# Patient Record
Sex: Female | Born: 1980 | Race: Black or African American | Hispanic: No | Marital: Single | Smoking: Never smoker
Health system: Southern US, Community
[De-identification: ages and names within clinical notes are randomized; demographics above are authoritative.]

## PROBLEM LIST (undated history)

## (undated) ENCOUNTER — Inpatient Hospital Stay (HOSPITAL_COMMUNITY): Payer: Self-pay

## (undated) DIAGNOSIS — A749 Chlamydial infection, unspecified: Secondary | ICD-10-CM

## (undated) DIAGNOSIS — I10 Essential (primary) hypertension: Secondary | ICD-10-CM

## (undated) DIAGNOSIS — J45909 Unspecified asthma, uncomplicated: Secondary | ICD-10-CM

## (undated) DIAGNOSIS — B9689 Other specified bacterial agents as the cause of diseases classified elsewhere: Secondary | ICD-10-CM

## (undated) DIAGNOSIS — N760 Acute vaginitis: Secondary | ICD-10-CM

## (undated) HISTORY — PX: WISDOM TOOTH EXTRACTION: SHX21

---
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
## 2002-03-20 ENCOUNTER — Emergency Department (HOSPITAL_COMMUNITY): Admission: EM | Admit: 2002-03-20 | Discharge: 2002-03-20 | Payer: Self-pay | Admitting: Emergency Medicine

## 2002-06-08 ENCOUNTER — Other Ambulatory Visit: Admission: RE | Admit: 2002-06-08 | Discharge: 2002-06-08 | Payer: Self-pay | Admitting: Family Medicine

## 2002-07-07 ENCOUNTER — Encounter: Payer: Self-pay | Admitting: Emergency Medicine

## 2002-07-07 ENCOUNTER — Emergency Department (HOSPITAL_COMMUNITY): Admission: EM | Admit: 2002-07-07 | Discharge: 2002-07-07 | Payer: Self-pay | Admitting: Emergency Medicine

## 2002-07-26 ENCOUNTER — Emergency Department (HOSPITAL_COMMUNITY): Admission: EM | Admit: 2002-07-26 | Discharge: 2002-07-26 | Payer: Self-pay | Admitting: Emergency Medicine

## 2002-11-19 ENCOUNTER — Encounter: Payer: Self-pay | Admitting: Obstetrics & Gynecology

## 2002-11-19 ENCOUNTER — Ambulatory Visit (HOSPITAL_COMMUNITY): Admission: RE | Admit: 2002-11-19 | Discharge: 2002-11-19 | Payer: Self-pay | Admitting: Obstetrics & Gynecology

## 2003-02-03 ENCOUNTER — Encounter: Payer: Self-pay | Admitting: Obstetrics & Gynecology

## 2003-02-03 ENCOUNTER — Ambulatory Visit (HOSPITAL_COMMUNITY): Admission: RE | Admit: 2003-02-03 | Discharge: 2003-02-03 | Payer: Self-pay | Admitting: Obstetrics & Gynecology

## 2003-05-13 ENCOUNTER — Ambulatory Visit (HOSPITAL_COMMUNITY): Admission: RE | Admit: 2003-05-13 | Discharge: 2003-05-13 | Payer: Self-pay | Admitting: Obstetrics & Gynecology

## 2003-06-10 ENCOUNTER — Ambulatory Visit (HOSPITAL_COMMUNITY): Admission: RE | Admit: 2003-06-10 | Discharge: 2003-06-10 | Payer: Self-pay | Admitting: Obstetrics & Gynecology

## 2003-06-18 ENCOUNTER — Inpatient Hospital Stay (HOSPITAL_COMMUNITY): Admission: AD | Admit: 2003-06-18 | Discharge: 2003-06-20 | Payer: Self-pay | Admitting: Obstetrics & Gynecology

## 2005-03-29 ENCOUNTER — Other Ambulatory Visit: Admission: RE | Admit: 2005-03-29 | Discharge: 2005-03-29 | Payer: Self-pay | Admitting: Family Medicine

## 2005-12-27 ENCOUNTER — Emergency Department (HOSPITAL_COMMUNITY): Admission: EM | Admit: 2005-12-27 | Discharge: 2005-12-27 | Payer: Self-pay | Admitting: Emergency Medicine

## 2006-12-04 ENCOUNTER — Emergency Department (HOSPITAL_COMMUNITY): Admission: EM | Admit: 2006-12-04 | Discharge: 2006-12-05 | Payer: Self-pay | Admitting: Emergency Medicine

## 2007-09-22 ENCOUNTER — Emergency Department (HOSPITAL_COMMUNITY): Admission: EM | Admit: 2007-09-22 | Discharge: 2007-09-22 | Payer: Self-pay | Admitting: Emergency Medicine

## 2007-12-05 ENCOUNTER — Inpatient Hospital Stay (HOSPITAL_COMMUNITY): Admission: AD | Admit: 2007-12-05 | Discharge: 2007-12-06 | Payer: Self-pay | Admitting: Family Medicine

## 2007-12-10 ENCOUNTER — Encounter: Payer: Self-pay | Admitting: Family

## 2007-12-10 ENCOUNTER — Ambulatory Visit: Payer: Self-pay | Admitting: Obstetrics & Gynecology

## 2007-12-11 ENCOUNTER — Ambulatory Visit: Payer: Self-pay | Admitting: Family Medicine

## 2007-12-23 ENCOUNTER — Ambulatory Visit (HOSPITAL_COMMUNITY): Admission: RE | Admit: 2007-12-23 | Discharge: 2007-12-23 | Payer: Self-pay | Admitting: Obstetrics & Gynecology

## 2008-01-08 ENCOUNTER — Ambulatory Visit: Payer: Self-pay | Admitting: *Deleted

## 2008-01-20 ENCOUNTER — Ambulatory Visit (HOSPITAL_COMMUNITY): Admission: RE | Admit: 2008-01-20 | Discharge: 2008-01-20 | Payer: Self-pay | Admitting: Obstetrics & Gynecology

## 2008-01-22 ENCOUNTER — Ambulatory Visit: Payer: Self-pay | Admitting: Obstetrics & Gynecology

## 2008-02-10 ENCOUNTER — Ambulatory Visit (HOSPITAL_COMMUNITY): Admission: RE | Admit: 2008-02-10 | Discharge: 2008-02-10 | Payer: Self-pay | Admitting: Obstetrics & Gynecology

## 2008-02-19 ENCOUNTER — Ambulatory Visit: Payer: Self-pay | Admitting: Obstetrics & Gynecology

## 2008-03-04 ENCOUNTER — Ambulatory Visit: Payer: Self-pay | Admitting: Obstetrics & Gynecology

## 2008-03-09 ENCOUNTER — Ambulatory Visit (HOSPITAL_COMMUNITY): Admission: RE | Admit: 2008-03-09 | Discharge: 2008-03-09 | Payer: Self-pay | Admitting: Obstetrics & Gynecology

## 2008-03-22 ENCOUNTER — Ambulatory Visit: Payer: Self-pay | Admitting: Family Medicine

## 2008-03-24 ENCOUNTER — Ambulatory Visit: Payer: Self-pay | Admitting: Obstetrics & Gynecology

## 2008-03-31 ENCOUNTER — Ambulatory Visit (HOSPITAL_COMMUNITY): Admission: RE | Admit: 2008-03-31 | Discharge: 2008-03-31 | Payer: Self-pay | Admitting: Obstetrics & Gynecology

## 2008-04-05 ENCOUNTER — Ambulatory Visit: Payer: Self-pay | Admitting: Obstetrics & Gynecology

## 2008-04-05 LAB — CONVERTED CEMR LAB
HCT: 34.5 % — ABNORMAL LOW (ref 36.0–46.0)
Hemoglobin: 11.1 g/dL — ABNORMAL LOW (ref 12.0–15.0)
MCHC: 32.2 g/dL (ref 30.0–36.0)
MCV: 79.3 fL (ref 78.0–100.0)
Platelets: 297 10*3/uL (ref 150–400)
RBC: 4.35 M/uL (ref 3.87–5.11)
RDW: 15.3 % (ref 11.5–15.5)
WBC: 9.7 10*3/uL (ref 4.0–10.5)

## 2008-04-21 ENCOUNTER — Ambulatory Visit (HOSPITAL_COMMUNITY): Admission: RE | Admit: 2008-04-21 | Discharge: 2008-04-21 | Payer: Self-pay | Admitting: Obstetrics & Gynecology

## 2008-04-26 ENCOUNTER — Ambulatory Visit: Payer: Self-pay | Admitting: Obstetrics & Gynecology

## 2008-04-26 ENCOUNTER — Encounter: Payer: Self-pay | Admitting: Obstetrics & Gynecology

## 2008-05-03 ENCOUNTER — Encounter: Payer: Self-pay | Admitting: Obstetrics & Gynecology

## 2008-05-03 ENCOUNTER — Ambulatory Visit: Payer: Self-pay | Admitting: Obstetrics & Gynecology

## 2008-05-03 LAB — CONVERTED CEMR LAB

## 2008-05-06 ENCOUNTER — Ambulatory Visit: Payer: Self-pay | Admitting: Family Medicine

## 2008-05-06 ENCOUNTER — Encounter: Payer: Self-pay | Admitting: Obstetrics & Gynecology

## 2008-05-08 ENCOUNTER — Ambulatory Visit: Payer: Self-pay | Admitting: Obstetrics and Gynecology

## 2008-05-08 ENCOUNTER — Inpatient Hospital Stay (HOSPITAL_COMMUNITY): Admission: AD | Admit: 2008-05-08 | Discharge: 2008-05-08 | Payer: Self-pay | Admitting: Family Medicine

## 2008-05-11 ENCOUNTER — Ambulatory Visit: Payer: Self-pay | Admitting: Obstetrics & Gynecology

## 2008-05-13 ENCOUNTER — Ambulatory Visit: Payer: Self-pay | Admitting: Obstetrics & Gynecology

## 2008-05-14 ENCOUNTER — Encounter: Payer: Self-pay | Admitting: Obstetrics & Gynecology

## 2008-05-17 ENCOUNTER — Ambulatory Visit: Payer: Self-pay | Admitting: Obstetrics & Gynecology

## 2008-05-19 ENCOUNTER — Ambulatory Visit (HOSPITAL_COMMUNITY): Admission: RE | Admit: 2008-05-19 | Discharge: 2008-05-19 | Payer: Self-pay | Admitting: Obstetrics & Gynecology

## 2008-05-25 ENCOUNTER — Ambulatory Visit: Payer: Self-pay | Admitting: Obstetrics & Gynecology

## 2008-06-01 ENCOUNTER — Ambulatory Visit: Payer: Self-pay | Admitting: Obstetrics & Gynecology

## 2008-06-02 ENCOUNTER — Ambulatory Visit (HOSPITAL_COMMUNITY): Admission: RE | Admit: 2008-06-02 | Discharge: 2008-06-02 | Payer: Self-pay | Admitting: Obstetrics & Gynecology

## 2008-06-03 ENCOUNTER — Encounter: Payer: Self-pay | Admitting: Obstetrics & Gynecology

## 2008-06-03 ENCOUNTER — Ambulatory Visit: Payer: Self-pay | Admitting: Obstetrics & Gynecology

## 2008-06-10 ENCOUNTER — Ambulatory Visit: Payer: Self-pay | Admitting: Obstetrics & Gynecology

## 2008-06-10 ENCOUNTER — Encounter: Payer: Self-pay | Admitting: Obstetrics & Gynecology

## 2008-06-10 LAB — CONVERTED CEMR LAB
Chlamydia, DNA Probe: NEGATIVE
GC Probe Amp, Genital: NEGATIVE

## 2008-06-14 ENCOUNTER — Ambulatory Visit (HOSPITAL_COMMUNITY): Admission: RE | Admit: 2008-06-14 | Discharge: 2008-06-14 | Payer: Self-pay | Admitting: Obstetrics & Gynecology

## 2008-06-16 ENCOUNTER — Ambulatory Visit (HOSPITAL_COMMUNITY): Admission: RE | Admit: 2008-06-16 | Discharge: 2008-06-16 | Payer: Self-pay | Admitting: Obstetrics & Gynecology

## 2008-06-21 ENCOUNTER — Ambulatory Visit (HOSPITAL_COMMUNITY): Admission: RE | Admit: 2008-06-21 | Discharge: 2008-06-21 | Payer: Self-pay | Admitting: Obstetrics & Gynecology

## 2008-06-24 ENCOUNTER — Ambulatory Visit: Payer: Self-pay | Admitting: Family Medicine

## 2008-06-29 ENCOUNTER — Inpatient Hospital Stay (HOSPITAL_COMMUNITY): Admission: RE | Admit: 2008-06-29 | Discharge: 2008-07-02 | Payer: Self-pay | Admitting: Obstetrics & Gynecology

## 2008-06-29 ENCOUNTER — Ambulatory Visit: Payer: Self-pay | Admitting: Advanced Practice Midwife

## 2009-04-25 ENCOUNTER — Emergency Department (HOSPITAL_COMMUNITY): Admission: EM | Admit: 2009-04-25 | Discharge: 2009-04-25 | Payer: Self-pay | Admitting: Family Medicine

## 2009-08-29 ENCOUNTER — Ambulatory Visit (HOSPITAL_COMMUNITY): Admission: RE | Admit: 2009-08-29 | Discharge: 2009-08-29 | Payer: Self-pay | Admitting: Internal Medicine

## 2009-11-23 ENCOUNTER — Emergency Department (HOSPITAL_COMMUNITY): Admission: EM | Admit: 2009-11-23 | Discharge: 2009-11-23 | Payer: Self-pay | Admitting: Emergency Medicine

## 2010-07-20 ENCOUNTER — Ambulatory Visit: Admit: 2010-07-20 | Payer: Self-pay | Admitting: Obstetrics & Gynecology

## 2010-09-17 LAB — CBC
HCT: 37.1 % (ref 36.0–46.0)
MCV: 76.9 fL — ABNORMAL LOW (ref 78.0–100.0)
RBC: 4.82 MIL/uL (ref 3.87–5.11)
WBC: 6.7 10*3/uL (ref 4.0–10.5)

## 2010-09-17 LAB — COMPREHENSIVE METABOLIC PANEL
AST: 22 U/L (ref 0–37)
CO2: 28 mEq/L (ref 19–32)
Chloride: 102 mEq/L (ref 96–112)
Creatinine, Ser: 0.74 mg/dL (ref 0.4–1.2)
GFR calc Af Amer: >60 mL/min (ref >60)
GFR calc non Af Amer: >60 mL/min (ref >60)
Total Bilirubin: 0.5 mg/dL (ref 0.3–1.2)

## 2010-09-17 LAB — LIPID PANEL: HDL: 31 mg/dL — ABNORMAL LOW (ref >39)

## 2010-09-17 LAB — TSH: TSH: 1.228 u[IU]/mL (ref 0.350–4.500)

## 2010-10-09 LAB — COMPREHENSIVE METABOLIC PANEL
ALT: 13 U/L (ref 0–35)
AST: 21 U/L (ref 0–37)
CO2: 24 mEq/L (ref 19–32)
Chloride: 108 mEq/L (ref 96–112)
GFR calc Af Amer: >60 mL/min (ref >60)
GFR calc non Af Amer: >60 mL/min (ref >60)
Potassium: 3.6 mEq/L (ref 3.5–5.1)
Sodium: 140 mEq/L (ref 135–145)
Total Bilirubin: 0.4 mg/dL (ref 0.3–1.2)

## 2010-10-09 LAB — CBC
Hemoglobin: 10.1 g/dL — ABNORMAL LOW (ref 12.0–15.0)
Platelets: 307 10*3/uL (ref 150–400)
RBC: 4.14 MIL/uL (ref 3.87–5.11)
RDW: 17.1 % — ABNORMAL HIGH (ref 11.5–15.5)

## 2011-03-19 LAB — URINE MICROSCOPIC-ADD ON

## 2011-03-19 LAB — WET PREP, GENITAL: Trich, Wet Prep: NONE SEEN

## 2011-03-19 LAB — URINE CULTURE: Colony Count: 35000

## 2011-03-19 LAB — BASIC METABOLIC PANEL
BUN: 7
Chloride: 107
Potassium: 3.9
Sodium: 140

## 2011-03-19 LAB — URINALYSIS, ROUTINE W REFLEX MICROSCOPIC
Hgb urine dipstick: NEGATIVE
Ketones, ur: NEGATIVE
Protein, ur: NEGATIVE
Urobilinogen, UA: 1

## 2011-03-19 LAB — DIFFERENTIAL
Eosinophils Absolute: 0.1
Eosinophils Relative: 2
Lymphocytes Relative: 27
Lymphs Abs: 2.5
Monocytes Absolute: 0.7
Monocytes Relative: 8

## 2011-03-19 LAB — CBC
HCT: 38.5
Hemoglobin: 12.8
MCV: 74.1 — ABNORMAL LOW
Platelets: 296
RBC: 5.2 — ABNORMAL HIGH
WBC: 9.1

## 2011-03-19 LAB — GC/CHLAMYDIA PROBE AMP, GENITAL
Chlamydia, DNA Probe: NEGATIVE
GC Probe Amp, Genital: NEGATIVE

## 2011-03-19 LAB — ETHANOL: Alcohol, Ethyl (B): <5

## 2011-03-22 LAB — COMPREHENSIVE METABOLIC PANEL
Albumin: 2.7 — ABNORMAL LOW
Alkaline Phosphatase: 71
BUN: 3 — ABNORMAL LOW
Calcium: 9.3
Glucose, Bld: 91
Potassium: 3.9
Sodium: 133 — ABNORMAL LOW
Total Protein: 7

## 2011-03-22 LAB — WET PREP, GENITAL
Trich, Wet Prep: NONE SEEN
Yeast Wet Prep HPF POC: NONE SEEN

## 2011-03-22 LAB — HCG, QUANTITATIVE, PREGNANCY: hCG, Beta Chain, Quant, S: 109709 — ABNORMAL HIGH

## 2011-03-22 LAB — GC/CHLAMYDIA PROBE AMP, GENITAL
Chlamydia, DNA Probe: POSITIVE — AB
GC Probe Amp, Genital: NEGATIVE

## 2011-03-22 LAB — RPR: RPR Ser Ql: NONREACTIVE

## 2011-03-22 LAB — POCT URINALYSIS DIP (DEVICE)
Hgb urine dipstick: NEGATIVE
Protein, ur: 30 — AB
Specific Gravity, Urine: 1.025
pH: 6

## 2011-03-22 LAB — CBC
MCHC: 32.6
MCV: 79.6
Platelets: 296
RBC: 5.01
WBC: 8.2

## 2011-03-22 LAB — URINALYSIS, ROUTINE W REFLEX MICROSCOPIC
Bilirubin Urine: NEGATIVE
Hgb urine dipstick: NEGATIVE
Specific Gravity, Urine: 1.025
Urobilinogen, UA: 0.2
pH: 6

## 2011-03-22 LAB — URINE MICROSCOPIC-ADD ON: RBC / HPF: NONE SEEN

## 2011-03-23 LAB — POCT URINALYSIS DIP (DEVICE)
Hgb urine dipstick: NEGATIVE
Hgb urine dipstick: NEGATIVE
Ketones, ur: 15 — AB
Operator id: 297281
Protein, ur: 30 — AB
Protein, ur: 30 — AB
Specific Gravity, Urine: 1.02
Specific Gravity, Urine: 1.02
pH: 5.5
pH: 7

## 2011-03-26 LAB — POCT URINALYSIS DIP (DEVICE)
Hgb urine dipstick: NEGATIVE
Ketones, ur: NEGATIVE
Operator id: 194561
Operator id: 297281
Protein, ur: NEGATIVE
Protein, ur: NEGATIVE
Specific Gravity, Urine: 1.025
Specific Gravity, Urine: 1.02

## 2011-03-27 LAB — POCT URINALYSIS DIP (DEVICE)
Glucose, UA: NEGATIVE
Glucose, UA: NEGATIVE
Glucose, UA: NEGATIVE
Glucose, UA: NEGATIVE
Ketones, ur: NEGATIVE
Nitrite: NEGATIVE
Operator id: 120861
Operator id: 287931
Operator id: 297281
Protein, ur: 100 — AB
Specific Gravity, Urine: 1.02
Specific Gravity, Urine: 1.025
Specific Gravity, Urine: 1.025
Specific Gravity, Urine: 1.025
Urobilinogen, UA: 0.2
pH: 6

## 2011-03-27 LAB — URINALYSIS, ROUTINE W REFLEX MICROSCOPIC
Glucose, UA: NEGATIVE
Protein, ur: NEGATIVE
pH: 7

## 2011-03-28 LAB — POCT URINALYSIS DIP (DEVICE)
Bilirubin Urine: NEGATIVE
Hgb urine dipstick: NEGATIVE
Nitrite: NEGATIVE
pH: 6

## 2011-03-30 LAB — POCT URINALYSIS DIP (DEVICE)
Hgb urine dipstick: NEGATIVE
Ketones, ur: 15 mg/dL — AB
Nitrite: POSITIVE — AB
Protein, ur: 100 mg/dL — AB
Protein, ur: 100 mg/dL — AB
Specific Gravity, Urine: 1.025 (ref 1.005–1.030)
Specific Gravity, Urine: 1.025 (ref 1.005–1.030)
Urobilinogen, UA: 1 mg/dL (ref 0.0–1.0)
Urobilinogen, UA: 2 mg/dL — ABNORMAL HIGH (ref 0.0–1.0)
pH: 6 (ref 5.0–8.0)
pH: 6 (ref 5.0–8.0)
pH: 6 (ref 5.0–8.0)

## 2011-11-28 ENCOUNTER — Encounter (HOSPITAL_COMMUNITY): Payer: Self-pay | Admitting: Emergency Medicine

## 2011-11-28 ENCOUNTER — Emergency Department (HOSPITAL_COMMUNITY)
Admission: EM | Admit: 2011-11-28 | Discharge: 2011-11-28 | Disposition: A | Discharge status: Home / Self Care | Payer: Medicare Other | Attending: Emergency Medicine | Admitting: Emergency Medicine

## 2011-11-28 DIAGNOSIS — R221 Localized swelling, mass and lump, neck: Secondary | ICD-10-CM | POA: Insufficient documentation

## 2011-11-28 DIAGNOSIS — B009 Herpesviral infection, unspecified: Secondary | ICD-10-CM

## 2011-11-28 DIAGNOSIS — I10 Essential (primary) hypertension: Secondary | ICD-10-CM | POA: Insufficient documentation

## 2011-11-28 DIAGNOSIS — R220 Localized swelling, mass and lump, head: Secondary | ICD-10-CM | POA: Insufficient documentation

## 2011-11-28 HISTORY — DX: Essential (primary) hypertension: I10

## 2011-11-28 HISTORY — DX: Unspecified asthma, uncomplicated: J45.909

## 2011-11-28 MED ORDER — LIDOCAINE VISCOUS 2 % MT SOLN: 20.0000 mL | OROMUCOSAL | Status: AC | PRN

## 2011-11-28 MED ORDER — ACYCLOVIR 200 MG PO CAPS: 200.0000 mg | ORAL_CAPSULE | Freq: Every day | ORAL | Status: AC

## 2011-11-28 NOTE — ED Notes (Signed)
Small white pustules on l/side of lip.Pt reports swelling on r/lip 12 hrs after eating an unwashed peach. Denies hx of allergies

## 2011-11-28 NOTE — ED Provider Notes (Signed)
History     CSN: [id number]  Arrival date & time 11/28/11  4098   First MD Initiated Contact with Patient 11/28/11 0952      10:32 AM HPI Patient reports she bit into a Peach this morning and states afterwards had swelling of her left lip. Reports tingling and burning sensation. 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 Patient then reports that she did have burning and tingling sensation yesterday and upon waking up this morning had vesicles on the lip. Also admits to having similar episodes for approximately 8 years but never having seen a physician. Denies fever, purulent drainage, dental pain.  HPI  Past Medical History  Diagnosis Date  . Asthma   . Hypertension     History reviewed. No pertinent past surgical history.  Family History  Problem Relation Age of Onset  . Diabetes Mother   . Hypertension Mother     History  Substance Use Topics  . Smoking status: Never Smoker   . Smokeless tobacco: Not on file  . Alcohol Use: No    OB History    Grav Para Term Preterm Abortions TAB SAB Ect Mult Living                  Review of Systems  Constitutional: Negative for fever and appetite change.  HENT: Positive for mouth sores. Negative for sore throat, trouble swallowing and dental problem.   All other systems reviewed and are negative.    Allergies  Review of patient's allergies indicates no known allergies.  Home Medications   Current Outpatient Rx  Name Route Sig Dispense Refill  . AMLODIPINE BESYLATE 5 MG PO TABS Oral Take 5 mg by mouth daily.    Marland Kitchen PHENTERMINE HCL 37.5 MG PO TABS Oral Take 37.5 mg by mouth daily before breakfast.      BP 122/80  Pulse 76  Temp(Src) 98.1 F (36.7 C) (Oral)  Resp 16  SpO2 96%  LMP 11/14/2011  Physical Exam  Vitals reviewed. Constitutional: She is oriented to person, place, and time. Vital signs are normal. She appears well-developed  and well-nourished. No distress.  HENT:  Head: Normocephalic and atraumatic.  Mouth/Throat:    Eyes: Pupils are equal, round, and reactive to light.  Neck: Neck supple.  Pulmonary/Chest: Effort normal.  Neurological: She is alert and oriented to person, place, and time.  Skin: Skin is warm and dry. No rash noted. No erythema. No pallor.  Psychiatric: She has a normal mood and affect. Her behavior is normal.    ED Course  Procedures   MDM   Advised followup with primary care physician for prophylactic treatment. Today will give acyclovir and lidocaine viscous solution. Discussed diagnosis of herpes simplex with patient patient voices understanding and is ready for tissue     Thomasene Lot, PA-C 11/28/11 1048

## 2011-11-28 NOTE — ED Provider Notes (Signed)
Medical screening examination/treatment/procedure(s) were performed by non-physician practitioner and as supervising physician I was immediately available for consultation/collaboration. Syanne Looney, MD, FACEP   Inocente Krach L Zaleigh Bermingham, MD 11/28/11 1919 

## 2012-09-07 ENCOUNTER — Encounter (HOSPITAL_BASED_OUTPATIENT_CLINIC_OR_DEPARTMENT_OTHER): Payer: Self-pay | Admitting: Emergency Medicine

## 2012-09-07 ENCOUNTER — Emergency Department (HOSPITAL_BASED_OUTPATIENT_CLINIC_OR_DEPARTMENT_OTHER)
Admission: EM | Admit: 2012-09-07 | Discharge: 2012-09-07 | Disposition: A | Discharge status: Home / Self Care | Payer: Medicare Other | Attending: Emergency Medicine | Admitting: Emergency Medicine

## 2012-09-07 DIAGNOSIS — J029 Acute pharyngitis, unspecified: Secondary | ICD-10-CM | POA: Insufficient documentation

## 2012-09-07 DIAGNOSIS — J45909 Unspecified asthma, uncomplicated: Secondary | ICD-10-CM | POA: Insufficient documentation

## 2012-09-07 DIAGNOSIS — R05 Cough: Secondary | ICD-10-CM | POA: Insufficient documentation

## 2012-09-07 DIAGNOSIS — I10 Essential (primary) hypertension: Secondary | ICD-10-CM | POA: Insufficient documentation

## 2012-09-07 DIAGNOSIS — J209 Acute bronchitis, unspecified: Secondary | ICD-10-CM | POA: Insufficient documentation

## 2012-09-07 DIAGNOSIS — R059 Cough, unspecified: Secondary | ICD-10-CM | POA: Insufficient documentation

## 2012-09-07 DIAGNOSIS — Z79899 Other long term (current) drug therapy: Secondary | ICD-10-CM | POA: Insufficient documentation

## 2012-09-07 MED ORDER — OXYMETAZOLINE HCL 0.05 % NA SOLN: 1.0000 | Freq: Once | NASAL | Status: DC

## 2012-09-07 MED ORDER — AZITHROMYCIN 250 MG PO TABS
500.0000 mg | ORAL_TABLET | Freq: Once | ORAL | Status: AC
  Administered 2012-09-07: 500 mg via ORAL
  Filled 2012-09-07: qty 2

## 2012-09-07 MED ORDER — OXYMETAZOLINE HCL 0.05 % NA SOLN
NASAL | Status: AC
  Filled 2012-09-07: qty 15

## 2012-09-07 MED ORDER — GUAIFENESIN-CODEINE 100-10 MG/5ML PO SYRP: ORAL_SOLUTION | ORAL | Status: DC

## 2012-09-07 MED ORDER — GUAIFENESIN-CODEINE 100-10 MG/5ML PO SOLN
10.0000 mL | Freq: Once | ORAL | Status: AC
  Administered 2012-09-07: 10 mL via ORAL
  Filled 2012-09-07: qty 10

## 2012-09-07 MED ORDER — AZITHROMYCIN 250 MG PO TABS: ORAL_TABLET | ORAL | Status: DC

## 2012-09-07 NOTE — ED Provider Notes (Signed)
History  This chart was scribed for Anna Cooper III, MD by Shari Heritage, ED Scribe. The patient was seen in room MH06/MH06. Patient's care was started at 1856.   CSN: [id number]  Arrival date & time 09/07/12  1721   First MD Initiated Contact with Patient 09/07/12 1856      Chief Complaint  Patient presents with  . Epistaxis  . Cough      The history is provided by the patient. No language interpreter was used.    HPI Comments: ANISA LEANOS is a 32 y.o. female who presents to the Emergency Department complaining of improving, mild bilateral epistaxis onset this morning when she woke up at about 4:30 am. Patient also complains of a cough, productive of white and green-yellow sputum for the past few days. There is associated sore throat. She states that she has been feeling poorly all day. She denies fever, ear pain, vomiting, diarrhea, difficulty urinating, rash, lightheadedness, syncope, vaginal discharge or vaginal bleeding. LMP was 08/23/2012. Patient has a history of hypertension and currently teaks Norvasc 5 mg.   PCP - Hortencia Pilar [facility], [place]  Past Medical History  Diagnosis Date  . Asthma   . Hypertension     No past surgical history on file.  Family History  Problem Relation Age of Onset  . Diabetes Mother   . Hypertension Mother     History  Substance Use Topics  . Smoking status: Never Smoker   . Smokeless tobacco: Not on file  . Alcohol Use: No    OB History   Grav Para Term Preterm Abortions TAB SAB Ect Mult Living                  Review of Systems  Constitutional: Negative for fever.  HENT: Positive for sore throat. Negative for ear pain.   Respiratory: Positive for cough.   Gastrointestinal: Negative for vomiting and diarrhea.  Genitourinary: Negative for vaginal bleeding, vaginal discharge and difficulty urinating.  Skin: Negative for rash.  Neurological: Negative for syncope and light-headedness.  All other  systems reviewed and are negative.    Allergies  Review of patient's allergies indicates no known allergies.  Home Medications   Current Outpatient Rx  Name  Route  Sig  Dispense  Refill  . amLODipine (NORVASC) 5 MG tablet   Oral   Take 5 mg by mouth daily.         . phentermine (ADIPEX-P) 37.5 MG tablet   Oral   Take 37.5 mg by mouth daily before breakfast.           Triage Vitals: BP 158/104  Pulse 82  Temp(Src) 98.2 F (36.8 C) (Oral)  Resp 20  SpO2 100%  Physical Exam  Constitutional: She is oriented to person, place, and time. She appears well-developed and well-nourished.  HENT:  Head: Normocephalic and atraumatic.  Nose: Nose normal. No epistaxis.  Mouth/Throat: Oropharynx is clear and moist and mucous membranes are normal.  No bleeding from nose.   Eyes: Conjunctivae and EOM are normal. Pupils are equal, round, and reactive to light.  Neck: Normal range of motion. Neck supple.  Cardiovascular: Normal rate, regular rhythm and normal heart sounds.   Pulmonary/Chest: Effort normal. She has rhonchi (over both lung fields). She has no rales.  Abdominal: Soft. There is no tenderness.  Musculoskeletal: Normal range of motion.  Neurological: She is alert and oriented to person, place, and time.  Skin: Skin is warm and dry.  Psychiatric: She has a normal mood and affect. Her behavior is normal.    ED Course  Procedures (including critical care time) DIAGNOSTIC STUDIES: Oxygen Saturation is 100% on room air, normal by my interpretation.    COORDINATION OF CARE: 7:07 PM- Suspect that cough and epistaxis are result of bronchitis. Will prescribe azithromycin and cough medicine to treat infection and suppress cough. Patient informed of current plan for treatment and evaluation and agrees with plan at this time.    Rx bronchitis with Z-pak, Robitussin AC.  Advised to be sure to take her blood pressure medicine every day.  Epistaxis is probably related to coughing  and to hypertension.      1. Acute bronchitis      I personally performed the services described in this documentation, which was scribed in my presence. The recorded information has been reviewed and is accurate.  Osvaldo Human, MD     Anna Cooper III, MD 09/07/12 [phone]

## 2012-09-07 NOTE — ED Notes (Signed)
Per EMS:  Nose bleed since this am.  Bleeding controlled.

## 2014-03-27 ENCOUNTER — Emergency Department (HOSPITAL_COMMUNITY)
Admission: EM | Admit: 2014-03-27 | Discharge: 2014-03-27 | Disposition: A | Discharge status: Home / Self Care | Payer: Medicare Other | Attending: Emergency Medicine | Admitting: Emergency Medicine

## 2014-03-27 ENCOUNTER — Encounter (HOSPITAL_COMMUNITY): Payer: Self-pay | Admitting: Emergency Medicine

## 2014-03-27 ENCOUNTER — Emergency Department (HOSPITAL_COMMUNITY): Payer: Medicare Other

## 2014-03-27 DIAGNOSIS — R110 Nausea: Secondary | ICD-10-CM | POA: Insufficient documentation

## 2014-03-27 DIAGNOSIS — Y9389 Activity, other specified: Secondary | ICD-10-CM | POA: Diagnosis not present

## 2014-03-27 DIAGNOSIS — Y92410 Unspecified street and highway as the place of occurrence of the external cause: Secondary | ICD-10-CM | POA: Diagnosis not present

## 2014-03-27 DIAGNOSIS — S3982XA Other specified injuries of lower back, initial encounter: Secondary | ICD-10-CM | POA: Insufficient documentation

## 2014-03-27 DIAGNOSIS — R42 Dizziness and giddiness: Secondary | ICD-10-CM | POA: Insufficient documentation

## 2014-03-27 DIAGNOSIS — I10 Essential (primary) hypertension: Secondary | ICD-10-CM | POA: Diagnosis not present

## 2014-03-27 DIAGNOSIS — J45909 Unspecified asthma, uncomplicated: Secondary | ICD-10-CM | POA: Diagnosis not present

## 2014-03-27 DIAGNOSIS — Z79899 Other long term (current) drug therapy: Secondary | ICD-10-CM | POA: Insufficient documentation

## 2014-03-27 DIAGNOSIS — M545 Low back pain, unspecified: Secondary | ICD-10-CM

## 2014-03-27 MED ORDER — ONDANSETRON 4 MG PO TBDP
4.0000 mg | ORAL_TABLET | Freq: Once | ORAL | Status: AC
  Administered 2014-03-27: 4 mg via ORAL
  Filled 2014-03-27: qty 1

## 2014-03-27 MED ORDER — NAPROXEN 375 MG PO TABS: 375.0000 mg | ORAL_TABLET | Freq: Two times a day (BID) | ORAL | Status: DC

## 2014-03-27 MED ORDER — TRAMADOL HCL 50 MG PO TABS: 50.0000 mg | ORAL_TABLET | Freq: Four times a day (QID) | ORAL | Status: DC | PRN

## 2014-03-27 MED ORDER — CYCLOBENZAPRINE HCL 5 MG PO TABS: 5.0000 mg | ORAL_TABLET | Freq: Two times a day (BID) | ORAL | Status: DC | PRN

## 2014-03-27 MED ORDER — TRAMADOL HCL 50 MG PO TABS
50.0000 mg | ORAL_TABLET | Freq: Once | ORAL | Status: AC
  Administered 2014-03-27: 50 mg via ORAL
  Filled 2014-03-27: qty 1
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
## 2014-03-27 NOTE — ED Provider Notes (Signed)
Medical screening examination/treatment/procedure(s) were performed by non-physician practitioner and as supervising physician I was immediately available for consultation/collaboration.    Vida RollerBrian D Jayonna Meyering, MD 03/27/14 [phone]
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
## 2014-03-27 NOTE — Discharge Instructions (Signed)
Back Pain, Adult °Low back pain is very common. About 1 in 5 people have back pain. The cause of low back pain is rarely dangerous. The pain often gets better over time. About half of people with a sudden onset of back pain feel better in just 2 weeks. About 8 in 10 people feel better by 6 weeks.  °CAUSES °Some common causes of back pain include: °· Strain of the muscles or ligaments supporting the spine. °· Wear and tear (degeneration) of the spinal discs. °· Arthritis. °· Direct injury to the back. °DIAGNOSIS °Most of the time, the direct cause of low back pain is not known. However, back pain can be treated effectively even when the exact cause of the pain is unknown. Answering your caregiver's questions about your overall health and symptoms is one of the most accurate ways to make sure the cause of your pain is not dangerous. If your caregiver needs more information, he or she may order lab work or imaging tests (X-rays or MRIs). However, even if imaging tests show changes in your back, this usually does not require surgery. °HOME CARE INSTRUCTIONS °For many people, back pain returns. Since low back pain is rarely dangerous, it is often a condition that people can learn to manage on their own.  °· Remain active. It is stressful on the back to sit or stand in one place. Do not sit, drive, or stand in one place for more than 30 minutes at a time. Take short walks on level surfaces as soon as pain allows. Try to increase the length of time you walk each day. °· Do not stay in bed. Resting more than 1 or 2 days can delay your recovery. °· Do not avoid exercise or work. Your body is made to move. It is not dangerous to be active, even though your back may hurt. Your back will likely heal faster if you return to being active before your pain is gone. °· Pay attention to your body when you  bend and lift. Many people have less discomfort when lifting if they bend their knees, keep the load close to their bodies, and  avoid twisting. Often, the most comfortable positions are those that put less stress on your recovering back. °· Find a comfortable position to sleep. Use a firm mattress and lie on your side with your knees slightly bent. If you lie on your back, put a pillow under your knees. °· Only take over-the-counter or prescription medicines as directed by your caregiver. Over-the-counter medicines to reduce pain and inflammation are often the most helpful. Your caregiver may prescribe muscle relaxant drugs. These medicines help dull your pain so you can more quickly return to your normal activities and healthy exercise. °· Put ice on the injured area. °· Put ice in a plastic bag. °· Place a towel between your skin and the bag. °· Leave the ice on for 15-20 minutes, 03-04 times a day for the first 2 to 3 days. After that, ice and heat may be alternated to reduce pain and spasms. °· Ask your caregiver about trying back exercises and gentle massage. This may be of some benefit. °· Avoid feeling anxious or stressed. Stress increases muscle tension and can worsen back pain. It is important to recognize when you are anxious or stressed and learn ways to manage it. Exercise is a great option. °SEEK MEDICAL CARE IF: °· You have pain that is not relieved with rest or medicine. °· You have pain that does not improve in 1 week. °· You have new symptoms. °· You are generally not feeling well. °SEEK   IMMEDIATE MEDICAL CARE IF:  °· You have pain that radiates from your back into your legs. °· You develop new bowel or bladder control problems. °· You have unusual weakness or numbness in your arms or legs. °· You develop nausea or vomiting. °· You develop abdominal pain. °· You feel faint. °Document Released: 06/11/2005 Document Revised: 12/11/2011 Document Reviewed: 10/13/2013 °[employer]® Patient Information ©2015 [employer]. This information is not intended to replace advice given to you by your health care provider. Make sure you  discuss any questions you have with your health care provider. ° °Motor Vehicle Collision °It is common to have multiple bruises and sore muscles after a motor vehicle collision (MVC). These tend to feel worse for the first 24 hours. You may have the most stiffness and soreness over the first several hours. You may also feel worse when you wake up the first morning after your collision. After this point, you will usually begin to improve with each day. The speed of improvement often depends on the severity of the collision, the number of injuries, and the location and nature of these injuries. °HOME CARE INSTRUCTIONS °· Put ice on the injured area. °¨ Put ice in a plastic bag. °¨ Place a towel between your skin and the bag. °¨ Leave the ice on for 15-20 minutes, 3-4 times a day, or as directed by your health care provider. °· Drink enough fluids to keep your urine clear or pale yellow. Do not drink alcohol. °· Take a warm shower or bath once or twice a day. This will increase blood flow to sore muscles. °· You may return to activities as directed by your caregiver. Be careful when lifting, as this may aggravate neck or back pain. °· Only take over-the-counter or prescription medicines for pain, discomfort, or fever as directed by your caregiver. Do not use aspirin. This may increase bruising and bleeding. °SEEK IMMEDIATE MEDICAL CARE IF: °· You have numbness, tingling, or weakness in the arms or legs. °· You develop severe headaches not relieved with medicine. °· You have severe neck pain, especially tenderness in the middle of the back of your neck. °· You have changes in bowel or bladder control. °· There is increasing pain in any area of the body. °· You have shortness of breath, light-headedness, dizziness, or fainting. °· You have chest pain. °· You feel sick to your stomach (nauseous), throw up (vomit), or sweat. °· You have increasing abdominal discomfort. °· There is blood in your urine, stool, or  vomit. °· You have pain in your shoulder (shoulder strap areas). °· You feel your symptoms are getting worse. °MAKE SURE YOU: °· Understand these instructions. °· Will watch your condition. °· Will get help right away if you are not doing well or get worse. °Document Released: 06/11/2005 Document Revised: 10/26/2013 Document Reviewed: 11/08/2010 °[employer]® Patient Information ©2015 [employer]. This information is not intended to replace advice given to you by your health care provider. Make sure you discuss any questions you have with your health care provider. ° °

## 2014-04-24 ENCOUNTER — Encounter (HOSPITAL_COMMUNITY): Payer: Self-pay | Admitting: Emergency Medicine

## 2014-04-24 ENCOUNTER — Emergency Department (HOSPITAL_COMMUNITY)
Admission: EM | Admit: 2014-04-24 | Discharge: 2014-04-25 | Disposition: A | Discharge status: Home / Self Care | Payer: No Typology Code available for payment source | Attending: Emergency Medicine | Admitting: Emergency Medicine

## 2014-04-24 ENCOUNTER — Emergency Department (HOSPITAL_COMMUNITY): Payer: No Typology Code available for payment source

## 2014-04-24 DIAGNOSIS — J45909 Unspecified asthma, uncomplicated: Secondary | ICD-10-CM | POA: Diagnosis not present

## 2014-04-24 DIAGNOSIS — M545 Low back pain, unspecified: Secondary | ICD-10-CM

## 2014-04-24 DIAGNOSIS — Z3202 Encounter for pregnancy test, result negative: Secondary | ICD-10-CM | POA: Diagnosis not present

## 2014-04-24 DIAGNOSIS — S199XXA Unspecified injury of neck, initial encounter: Secondary | ICD-10-CM | POA: Diagnosis not present

## 2014-04-24 DIAGNOSIS — S29092A Other injury of muscle and tendon of back wall of thorax, initial encounter: Secondary | ICD-10-CM | POA: Insufficient documentation

## 2014-04-24 DIAGNOSIS — Y9389 Activity, other specified: Secondary | ICD-10-CM | POA: Diagnosis not present

## 2014-04-24 DIAGNOSIS — Y92410 Unspecified street and highway as the place of occurrence of the external cause: Secondary | ICD-10-CM | POA: Insufficient documentation

## 2014-04-24 DIAGNOSIS — I10 Essential (primary) hypertension: Secondary | ICD-10-CM | POA: Insufficient documentation

## 2014-04-24 DIAGNOSIS — Z791 Long term (current) use of non-steroidal anti-inflammatories (NSAID): Secondary | ICD-10-CM | POA: Insufficient documentation

## 2014-04-24 DIAGNOSIS — Z79899 Other long term (current) drug therapy: Secondary | ICD-10-CM | POA: Insufficient documentation

## 2014-04-24 DIAGNOSIS — S3992XA Unspecified injury of lower back, initial encounter: Secondary | ICD-10-CM | POA: Diagnosis present

## 2014-04-24 LAB — PREGNANCY, URINE: PREG TEST UR: NEGATIVE

## 2014-04-24 MED ORDER — KETOROLAC TROMETHAMINE 60 MG/2ML IM SOLN
60.0000 mg | Freq: Once | INTRAMUSCULAR | Status: AC
  Administered 2014-04-24: 60 mg via INTRAMUSCULAR
  Filled 2014-04-24: qty 2

## 2014-04-24 MED ORDER — IBUPROFEN 800 MG PO TABS
800.0000 mg | ORAL_TABLET | Freq: Once | ORAL | Status: AC
  Administered 2014-04-24: 800 mg via ORAL
  Filled 2014-04-24: qty 1

## 2014-04-24 MED ORDER — NAPROXEN 500 MG PO TABS: 500.0000 mg | ORAL_TABLET | Freq: Two times a day (BID) | ORAL | Status: DC
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
## 2014-05-18 ENCOUNTER — Emergency Department (HOSPITAL_COMMUNITY)
Admission: EM | Admit: 2014-05-18 | Discharge: 2014-05-18 | Disposition: A | Discharge status: Home / Self Care | Payer: No Typology Code available for payment source | Attending: Emergency Medicine | Admitting: Emergency Medicine

## 2014-05-18 DIAGNOSIS — Y92410 Unspecified street and highway as the place of occurrence of the external cause: Secondary | ICD-10-CM | POA: Diagnosis not present

## 2014-05-18 DIAGNOSIS — Y998 Other external cause status: Secondary | ICD-10-CM | POA: Diagnosis not present

## 2014-05-18 DIAGNOSIS — E669 Obesity, unspecified: Secondary | ICD-10-CM | POA: Insufficient documentation

## 2014-05-18 DIAGNOSIS — M545 Low back pain, unspecified: Secondary | ICD-10-CM

## 2014-05-18 DIAGNOSIS — Y9389 Activity, other specified: Secondary | ICD-10-CM | POA: Diagnosis not present

## 2014-05-18 DIAGNOSIS — Z791 Long term (current) use of non-steroidal anti-inflammatories (NSAID): Secondary | ICD-10-CM | POA: Insufficient documentation

## 2014-05-18 DIAGNOSIS — J45909 Unspecified asthma, uncomplicated: Secondary | ICD-10-CM | POA: Insufficient documentation

## 2014-05-18 DIAGNOSIS — S0990XA Unspecified injury of head, initial encounter: Secondary | ICD-10-CM | POA: Insufficient documentation

## 2014-05-18 DIAGNOSIS — M62830 Muscle spasm of back: Secondary | ICD-10-CM | POA: Diagnosis not present

## 2014-05-18 DIAGNOSIS — I10 Essential (primary) hypertension: Secondary | ICD-10-CM | POA: Insufficient documentation

## 2014-05-18 DIAGNOSIS — S3992XA Unspecified injury of lower back, initial encounter: Secondary | ICD-10-CM | POA: Diagnosis present

## 2014-05-18 MED ORDER — CYCLOBENZAPRINE HCL 10 MG PO TABS: 10.0000 mg | ORAL_TABLET | Freq: Three times a day (TID) | ORAL | Status: DC | PRN

## 2014-05-18 MED ORDER — HYDROCODONE-ACETAMINOPHEN 5-325 MG PO TABS
1.0000 | ORAL_TABLET | Freq: Once | ORAL | Status: AC
Start: 2014-05-18 — End: 2014-05-18
  Administered 2014-05-18: 1 via ORAL
  Filled 2014-05-18: qty 1

## 2014-05-18 MED ORDER — NAPROXEN 500 MG PO TABS
500.0000 mg | ORAL_TABLET | Freq: Two times a day (BID) | ORAL | Status: DC | PRN
Start: 2014-05-18 — End: 2015-03-05

## 2014-05-18 MED ORDER — HYDROCODONE-ACETAMINOPHEN 5-325 MG PO TABS: 1.0000 | ORAL_TABLET | Freq: Four times a day (QID) | ORAL | Status: DC | PRN
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
## 2014-05-18 NOTE — ED Notes (Signed)
Pt tolerating ice water and crackers without difficulty

## 2014-05-18 NOTE — ED Notes (Signed)
Patient pulling at C-collar, trying to take it off-attempted to explain to patient the importance of collar-patient non-responsive to feedback/redirection
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
## 2015-03-05 ENCOUNTER — Encounter (HOSPITAL_COMMUNITY): Payer: Self-pay | Admitting: *Deleted

## 2015-03-05 ENCOUNTER — Emergency Department (INDEPENDENT_AMBULATORY_CARE_PROVIDER_SITE_OTHER)
Admission: EM | Admit: 2015-03-05 | Discharge: 2015-03-05 | Disposition: A | Discharge status: Home / Self Care | Payer: Medicare Other | Source: Home / Self Care | Attending: Family Medicine | Admitting: Family Medicine

## 2015-03-05 ENCOUNTER — Other Ambulatory Visit (HOSPITAL_COMMUNITY)
Admission: RE | Admit: 2015-03-05 | Discharge: 2015-03-05 | Disposition: A | Discharge status: Home / Self Care | Payer: Medicare Other | Source: Ambulatory Visit | Attending: Family Medicine | Admitting: Family Medicine

## 2015-03-05 DIAGNOSIS — Z113 Encounter for screening for infections with a predominantly sexual mode of transmission: Secondary | ICD-10-CM | POA: Insufficient documentation

## 2015-03-05 DIAGNOSIS — N760 Acute vaginitis: Secondary | ICD-10-CM | POA: Diagnosis present

## 2015-03-05 DIAGNOSIS — N390 Urinary tract infection, site not specified: Secondary | ICD-10-CM

## 2015-03-05 HISTORY — DX: Chlamydial infection, unspecified: A74.9

## 2015-03-05 HISTORY — DX: Other specified bacterial agents as the cause of diseases classified elsewhere: N76.0

## 2015-03-05 HISTORY — DX: Other specified bacterial agents as the cause of diseases classified elsewhere: B96.89

## 2015-03-05 LAB — POCT URINALYSIS DIP (DEVICE)
Bilirubin Urine: NEGATIVE
GLUCOSE, UA: NEGATIVE mg/dL
Hgb urine dipstick: NEGATIVE
KETONES UR: NEGATIVE mg/dL
Nitrite: NEGATIVE
PH: 5.5 (ref 5.0–8.0)
PROTEIN: NEGATIVE mg/dL
Specific Gravity, Urine: 1.02 (ref 1.005–1.030)
UROBILINOGEN UA: 2 mg/dL — AB (ref 0.0–1.0)

## 2015-03-05 LAB — POCT PREGNANCY, URINE: PREG TEST UR: NEGATIVE

## 2015-03-05 MED ORDER — AZITHROMYCIN 250 MG PO TABS
1000.0000 mg | ORAL_TABLET | Freq: Once | ORAL | Status: AC
  Administered 2015-03-05: 1000 mg via ORAL

## 2015-03-05 MED ORDER — FLUCONAZOLE 150 MG PO TABS: 150.0000 mg | ORAL_TABLET | Freq: Once | ORAL | Status: DC

## 2015-03-05 MED ORDER — METRONIDAZOLE 500 MG PO TABS: 500.0000 mg | ORAL_TABLET | Freq: Two times a day (BID) | ORAL | Status: DC

## 2015-03-05 MED ORDER — CEPHALEXIN 500 MG PO CAPS: 500.0000 mg | ORAL_CAPSULE | Freq: Two times a day (BID) | ORAL | Status: DC

## 2015-03-05 MED ORDER — CEFTRIAXONE SODIUM 250 MG IJ SOLR
250.0000 mg | Freq: Once | INTRAMUSCULAR | Status: AC
  Administered 2015-03-05: 250 mg via INTRAMUSCULAR

## 2015-03-05 NOTE — Discharge Instructions (Signed)
Thank you for coming in today. We will call you with results if positive,.  Take keflex and flagyl twice daily for 1 week.  Return as needed.  If your belly pain worsens, or you have high fever, bad vomiting, blood in your stool or black tarry stool go to the Emergency Room.   Vaginitis Vaginitis is an inflammation of the vagina. It is most often caused by a change in the normal balance of the bacteria and yeast that live in the vagina. This change in balance causes an overgrowth of certain bacteria or yeast, which causes the inflammation. There are different types of vaginitis, but the most common types are:  Bacterial vaginosis.  Yeast infection (candidiasis).  Trichomoniasis vaginitis. This is a sexually transmitted infection (STI).  Viral vaginitis.  Atropic vaginitis.  Allergic vaginitis. CAUSES  The cause depends on the type of vaginitis. Vaginitis can be caused by:  Bacteria (bacterial vaginosis).  Yeast (yeast infection).  A parasite (trichomoniasis vaginitis)  A virus (viral vaginitis).  Low hormone levels (atrophic vaginitis). Low hormone levels can occur during pregnancy, breastfeeding, or after menopause.  Irritants, such as bubble baths, scented tampons, and feminine sprays (allergic vaginitis). Other factors can change the normal balance of the yeast and bacteria that live in the vagina. These include:  Antibiotic medicines.  Poor hygiene.  Diaphragms, vaginal sponges, spermicides, birth control pills, and intrauterine devices (IUD).  Sexual intercourse.  Infection.  Uncontrolled diabetes.  A weakened immune system. SYMPTOMS  Symptoms can vary depending on the cause of the vaginitis. Common symptoms include:  Abnormal vaginal discharge.  The discharge is white, gray, or yellow with bacterial vaginosis.  The discharge is thick, white, and cheesy with a yeast infection.  The discharge is frothy and yellow or greenish with trichomoniasis.  A bad  vaginal odor.  The odor is fishy with bacterial vaginosis.  Vaginal itching, pain, or swelling.  Painful intercourse.  Pain or burning when urinating. Sometimes, there are no symptoms. TREATMENT  Treatment will vary depending on the type of infection.   Bacterial vaginosis and trichomoniasis are often treated with antibiotic creams or pills.  Yeast infections are often treated with antifungal medicines, such as vaginal creams or suppositories.  Viral vaginitis has no cure, but symptoms can be treated with medicines that relieve discomfort. Your sexual partner should be treated as well.  Atrophic vaginitis may be treated with an estrogen cream, pill, suppository, or vaginal ring. If vaginal dryness occurs, lubricants and moisturizing creams may help. You may be told to avoid scented soaps, sprays, or douches.  Allergic vaginitis treatment involves quitting the use of the product that is causing the problem. Vaginal creams can be used to treat the symptoms. HOME CARE INSTRUCTIONS   Take all medicines as directed by your caregiver.  Keep your genital area clean and dry. Avoid soap and only rinse the area with water.  Avoid douching. It can remove the healthy bacteria in the vagina.  Do not use tampons or have sexual intercourse until your vaginitis has been treated. Use sanitary pads while you have vaginitis.  Wipe from front to back. This avoids the spread of bacteria from the rectum to the vagina.  Let air reach your genital area.  Wear cotton underwear to decrease moisture buildup.  Avoid wearing underwear while you sleep until your vaginitis is gone.  Avoid tight pants and underwear or nylons without a cotton panel.  Take off wet clothing (especially bathing suits) as soon as possible.  Use  mild, non-scented products. Avoid using irritants, such as:  Scented feminine sprays.  Fabric softeners.  Scented detergents.  Scented tampons.  Scented soaps or bubble  baths.  Practice safe sex and use condoms. Condoms may prevent the spread of trichomoniasis and viral vaginitis. SEEK MEDICAL CARE IF:   You have abdominal pain.  You have a fever or persistent symptoms for more than 2-3 days.  You have a fever and your symptoms suddenly get worse. Document Released: 04/08/2007 Document Revised: 03/05/2012 Document Reviewed: 11/22/2011 [facility] Patient Information 2015 [place], [place]. This information is not intended to replace advice given to you by your health care provider. Make sure you discuss any questions you have with your health care provider.   Urinary Tract Infection Urinary tract infections (UTIs) can develop anywhere along your urinary tract. Your urinary tract is your body's drainage system for removing wastes and extra water. Your urinary tract includes two kidneys, two ureters, a bladder, and a urethra. Your kidneys are a pair of bean-shaped organs. Each kidney is about the size of your fist. They are located below your ribs, one on each side of your spine. CAUSES Infections are caused by microbes, which are microscopic organisms, including fungi, viruses, and bacteria. These organisms are so small that they can only be seen through a microscope. Bacteria are the microbes that most commonly cause UTIs. SYMPTOMS  Symptoms of UTIs may vary by age and gender of the patient and by the location of the infection. Symptoms in young women typically include a frequent and intense urge to urinate and a painful, burning feeling in the bladder or urethra during urination. Older women and men are more likely to be tired, shaky, and weak and have muscle aches and abdominal pain. A fever may mean the infection is in your kidneys. Other symptoms of a kidney infection include pain in your back or sides below the ribs, nausea, and vomiting. DIAGNOSIS To diagnose a UTI, your caregiver will ask you about your symptoms. Your caregiver also will ask to provide a  urine sample. The urine sample will be tested for bacteria and white blood cells. White blood cells are made by your body to help fight infection. TREATMENT  Typically, UTIs can be treated with medication. Because most UTIs are caused by a bacterial infection, they usually can be treated with the use of antibiotics. The choice of antibiotic and length of treatment depend on your symptoms and the type of bacteria causing your infection. HOME CARE INSTRUCTIONS  If you were prescribed antibiotics, take them exactly as your caregiver instructs you. Finish the medication even if you feel better after you have only taken some of the medication.  Drink enough water and fluids to keep your urine clear or pale yellow.  Avoid caffeine, tea, and carbonated beverages. They tend to irritate your bladder.  Empty your bladder often. Avoid holding urine for long periods of time.  Empty your bladder before and after sexual intercourse.  After a bowel movement, women should cleanse from front to back. Use each tissue only once. SEEK MEDICAL CARE IF:   You have back pain.  You develop a fever.  Your symptoms do not begin to resolve within 3 days. SEEK IMMEDIATE MEDICAL CARE IF:   You have severe back pain or lower abdominal pain.  You develop chills.  You have nausea or vomiting.  You have continued burning or discomfort with urination. MAKE SURE YOU:   Understand these instructions.  Will watch your condition.  Will get help right away if you are not doing well or get worse. Document Released: 03/21/2005 Document Revised: 12/11/2011 Document Reviewed: 07/20/2011 [facility] Patient Information 2015 [place], [place]. This information is not intended to replace advice given to you by your health care provider. Make sure you discuss any questions you have with your health care provider.

## 2015-03-05 NOTE — ED Notes (Signed)
S/S allergic reaction reviewed w/ pt.  Instructed to inform staff member immediately if any problems.

## 2015-03-05 NOTE — ED Provider Notes (Signed)
Anna King is a 34 y.o. female who presents to Urgent Care today for vaginal discharge, or frequency urgency dysuria and mild back pain. Symptoms present for about a week. Symptoms started after patient had sex and the condom fell off. She feels well otherwise no fevers chills nausea vomiting or diarrhea. No treatment tried yet.   Past Medical History  Diagnosis Date  . Asthma   . Hypertension   . Chlamydia   . BV (bacterial vaginosis)    History reviewed. No pertinent past surgical history. Social History  Substance Use Topics  . Smoking status: Never Smoker   . Smokeless tobacco: Not on file  . Alcohol Use: No   ROS as above Medications: Current Facility-Administered Medications  Medication Dose Route Frequency Provider Last Rate Last Dose  . azithromycin (ZITHROMAX) tablet 1,000 mg  1,000 mg Oral Once Rodolph Bong, MD      . cefTRIAXone (ROCEPHIN) injection 250 mg  250 mg Intramuscular Once Rodolph Bong, MD       Current Outpatient Prescriptions  Medication Sig Dispense Refill  . amLODipine (NORVASC) 10 MG tablet Take 10 mg by mouth daily. **Pt states she is taking one HTN med only; not sure which one    . cyclobenzaprine (FLEXERIL) 10 MG tablet Take 1 tablet (10 mg total) by mouth 3 (three) times daily as needed for muscle spasms. 15 tablet 0  . hydrochlorothiazide (HYDRODIURIL) 25 MG tablet Take 25 mg by mouth daily.    Marland Kitchen HYDROcodone-acetaminophen (NORCO) 5-325 MG per tablet Take 1-2 tablets by mouth every 6 (six) hours as needed for severe pain. 10 tablet 0  . naproxen (NAPROSYN) 500 MG tablet Take 1 tablet (500 mg total) by mouth 2 (two) times daily with a meal. (Patient not taking: Reported on 05/18/2014) 30 tablet 0  . naproxen (NAPROSYN) 500 MG tablet Take 1 tablet (500 mg total) by mouth 2 (two) times daily as needed for mild pain, moderate pain or headache (TAKE WITH MEALS.). 20 tablet 0  . phentermine (ADIPEX-P) 37.5 MG tablet Take 37.5 mg by mouth daily before  breakfast.     Allergies  Allergen Reactions  . Shellfish Allergy Anaphylaxis     Exam:  BP 145/84 mmHg  Pulse 76  Temp(Src) 98.3 F (36.8 C) (Oral)  Resp 20  SpO2 100%  LMP 02/21/2015 (Approximate) Gen: Well NAD morbidly obese HEENT: EOMI,  MMM Lungs: Normal work of breathing. CTABL Heart: RRR no MRG Abd: NABS, Soft. Nondistended, Nontender Exts: Brisk capillary refill, warm and well perfused.  GYN: Normal external genitalia. Vaginal canal with thin white discharge. Normal-appearing cervix with intact IUD string. Nontender.  Results for orders placed or performed during the hospital encounter of 03/05/15 (from the past 24 hour(s))  POCT urinalysis dip (device)     Status: Abnormal   Collection Time: 03/05/15  1:51 PM  Result Value Ref Range   Glucose, UA NEGATIVE NEGATIVE mg/dL   Bilirubin Urine NEGATIVE NEGATIVE   Ketones, ur NEGATIVE NEGATIVE mg/dL   Specific Gravity, Urine 1.020 1.005 - 1.030   Hgb urine dipstick NEGATIVE NEGATIVE   pH 5.5 5.0 - 8.0   Protein, ur NEGATIVE NEGATIVE mg/dL   Urobilinogen, UA 2.0 (H) 0.0 - 1.0 mg/dL   Nitrite NEGATIVE NEGATIVE   Leukocytes, UA SMALL (A) NEGATIVE  Pregnancy, urine POC     Status: None   Collection Time: 03/05/15  1:55 PM  Result Value Ref Range   Preg Test, Ur NEGATIVE NEGATIVE  No results found.  Assessment and Plan: 34 y.o. female with  1) vaginitis. Cytology pending for gonorrhea Chlamydia trichomonas BV and yeast. Additionally HIV and RPR test pending. Treat empirically with 1 g azithromycin by mouth, and 250 mg ceftriaxone IM as well as Flagyl.  2) dysuria: Likely also UTI. Urine culture pending. Treat additionally with Keflex.  Discussed warning signs or symptoms. Please see discharge instructions. Patient expresses understanding.     Rodolph Bong, MD 03/05/15 [phone]

## 2015-03-05 NOTE — ED Notes (Signed)
C/O low back pain, low abd pain, vaginal discharge x 5 days.

## 2015-03-06 LAB — URINE CULTURE: Special Requests: NORMAL

## 2015-03-06 LAB — HIV ANTIBODY (ROUTINE TESTING W REFLEX): HIV SCREEN 4TH GENERATION: NONREACTIVE

## 2015-03-06 LAB — RPR: RPR Ser Ql: NONREACTIVE

## 2015-03-07 LAB — CERVICOVAGINAL ANCILLARY ONLY
Chlamydia: POSITIVE — AB
Neisseria Gonorrhea: NEGATIVE

## 2015-03-08 LAB — CERVICOVAGINAL ANCILLARY ONLY: Wet Prep (BD Affirm): NEGATIVE

## 2015-03-08 NOTE — ED Notes (Addendum)
Attempted to call patient to discuss lab findings. Message on phone number indicates her mailbox is full and will not premit a message. Letter sent to address listed on sign in to [facility]. Patient called at 19:55, letter destroyed. After verifying ID, discussed positive findings. Advised to notify her partner, and she is to avoid unprotected sex until both parties are treated and clear

## 2015-03-08 NOTE — ED Notes (Signed)
From [facility] completed and faxed to [facility] for their records

## 2015-03-30 ENCOUNTER — Emergency Department (INDEPENDENT_AMBULATORY_CARE_PROVIDER_SITE_OTHER)
Admission: EM | Admit: 2015-03-30 | Discharge: 2015-03-30 | Disposition: A | Discharge status: Home / Self Care | Payer: Medicare Other | Source: Home / Self Care | Attending: Emergency Medicine | Admitting: Emergency Medicine

## 2015-03-30 ENCOUNTER — Encounter (HOSPITAL_COMMUNITY): Payer: Self-pay | Admitting: Emergency Medicine

## 2015-03-30 ENCOUNTER — Other Ambulatory Visit (HOSPITAL_COMMUNITY)
Admission: RE | Admit: 2015-03-30 | Discharge: 2015-03-30 | Disposition: A | Discharge status: Home / Self Care | Payer: Medicare Other | Source: Ambulatory Visit | Attending: Emergency Medicine | Admitting: Emergency Medicine

## 2015-03-30 DIAGNOSIS — B373 Candidiasis of vulva and vagina: Secondary | ICD-10-CM | POA: Diagnosis not present

## 2015-03-30 DIAGNOSIS — B3731 Acute candidiasis of vulva and vagina: Secondary | ICD-10-CM

## 2015-03-30 DIAGNOSIS — Z113 Encounter for screening for infections with a predominantly sexual mode of transmission: Secondary | ICD-10-CM | POA: Insufficient documentation

## 2015-03-30 LAB — POCT URINALYSIS DIP (DEVICE)
Bilirubin Urine: NEGATIVE
GLUCOSE, UA: NEGATIVE mg/dL
HGB URINE DIPSTICK: NEGATIVE
Nitrite: NEGATIVE
PH: 7 (ref 5.0–8.0)
PROTEIN: NEGATIVE mg/dL
SPECIFIC GRAVITY, URINE: 1.02 (ref 1.005–1.030)
UROBILINOGEN UA: 0.2 mg/dL (ref 0.0–1.0)

## 2015-03-30 LAB — POCT PREGNANCY, URINE: Preg Test, Ur: NEGATIVE

## 2015-03-30 MED ORDER — CLOTRIMAZOLE-BETAMETHASONE 1-0.05 % EX CREA: TOPICAL_CREAM | CUTANEOUS | Status: DC

## 2015-03-30 NOTE — ED Notes (Signed)
The patient presented to the [facility] with a complaint of vaginal discharge, odor and dysuria that has been ongoing for 1 week. The patient stated that she has tried OTC meds with no relief.

## 2015-03-30 NOTE — ED Provider Notes (Signed)
CSN: [id number]     Arrival date & time 03/30/15  1938 History   First MD Initiated Contact with Patient 03/30/15 2004     Chief Complaint  Patient presents with  . Dysuria  . Vaginal Itching   (Consider location/radiation/quality/duration/timing/severity/associated sxs/prior Treatment) HPI She is a 34 year old woman here for evaluation of dysuria and vaginal itching. She states she was seen here about a month ago and treated for a urinary tract infection as well as chlamydia. She was given Diflucan to take after antibiotics in case she developed a yeast infection. She states she developed itching so she took the Diflucan, but she has continued to have itching and a little bit of discharge. She has tried over-the-counter treatments without improvement. She also reports pain when she sees. She reports some mild lower abdominal pain and low back pain. No fevers or chills. She has had new sexual partners. She has not had sex with her prior partner.  Past Medical History  Diagnosis Date  . Asthma   . Hypertension   . Chlamydia   . BV (bacterial vaginosis)    History reviewed. No pertinent past surgical history. Family History  Problem Relation Age of Onset  . Diabetes Mother   . Hypertension Mother    Social History  Substance Use Topics  . Smoking status: Never Smoker   . Smokeless tobacco: None  . Alcohol Use: No   OB History    No data available     Review of Systems As in history of present illness Allergies  Shellfish allergy  Home Medications   Prior to Admission medications   Medication Sig Start Date End Date Taking? Authorizing Provider  amLODipine (NORVASC) 10 MG tablet Take 10 mg by mouth daily. **Pt states she is taking one HTN med only; not sure which one    Historical Provider, MD  clotrimazole-betamethasone (LOTRISONE) cream Apply to affected area 2 times daily for 1 week.  Do not place inside the vagina. 03/30/15   Charm Rings, MD  hydrochlorothiazide  (HYDRODIURIL) 25 MG tablet Take 25 mg by mouth daily.    Historical Provider, MD  phentermine (ADIPEX-P) 37.5 MG tablet Take 37.5 mg by mouth daily before breakfast.    Historical Provider, MD   Meds Ordered and Administered this Visit  Medications - No data to display  BP 144/98 mmHg  Pulse 79  Temp(Src) 98.1 F (36.7 C) (Oral)  Resp 20  SpO2 97%  LMP 03/24/2015 (Approximate) No data found.   Physical Exam  Constitutional: She is oriented to person, place, and time. She appears well-developed and well-nourished. No distress.  Morbidly obese  Cardiovascular: Normal rate.   Pulmonary/Chest: Effort normal.  Genitourinary: Vagina normal.    Cervix exhibits no motion tenderness. No bleeding in the vagina. No foreign body around the vagina. No vaginal discharge found.  Erythema and superficial abrasions to bilateral inner labia  Neurological: She is alert and oriented to person, place, and time.    ED Course  Procedures (including critical care time)  Labs Review Labs Reviewed  POCT URINALYSIS DIP (DEVICE) - Abnormal; Notable for the following:    Ketones, ur TRACE (*)    Leukocytes, UA LARGE (*)    All other components within normal limits  URINE CULTURE  POCT PREGNANCY, URINE  CERVICOVAGINAL ANCILLARY ONLY    Imaging Review No results found.    MDM   1. Vulvovaginal candidiasis    Swab sent. Treat with clotrimazole-betamethasone cream to inner labia. Follow-up  as needed.    Charm Rings, MD 03/30/15 2046

## 2015-03-30 NOTE — Discharge Instructions (Signed)
You have a yeast infection on the inner labia. Apply the cream to the inner labia twice a day for the next week. Do not place this cream inside the vagina. You should see improvement in the next 2 days. Follow-up as needed.

## 2015-03-31 LAB — URINE CULTURE

## 2015-03-31 LAB — CERVICOVAGINAL ANCILLARY ONLY
CHLAMYDIA, DNA PROBE: NEGATIVE
NEISSERIA GONORRHEA: NEGATIVE
Trichomonas: NEGATIVE

## 2015-04-01 LAB — CERVICOVAGINAL ANCILLARY ONLY: WET PREP (BD AFFIRM): NEGATIVE

## 2015-04-04 NOTE — ED Notes (Signed)
Final report urine C&S shows multiple species- negative report. Vaginal swab testing results negative . No furhter action required

## 2015-04-22 ENCOUNTER — Emergency Department (INDEPENDENT_AMBULATORY_CARE_PROVIDER_SITE_OTHER)
Admission: EM | Admit: 2015-04-22 | Discharge: 2015-04-22 | Disposition: A | Discharge status: Home / Self Care | Payer: Medicare Other | Source: Home / Self Care | Attending: Family Medicine | Admitting: Family Medicine

## 2015-04-22 ENCOUNTER — Encounter (HOSPITAL_COMMUNITY): Payer: Self-pay | Admitting: Emergency Medicine

## 2015-04-22 DIAGNOSIS — B373 Candidiasis of vulva and vagina: Secondary | ICD-10-CM

## 2015-04-22 DIAGNOSIS — B3731 Acute candidiasis of vulva and vagina: Secondary | ICD-10-CM

## 2015-04-22 MED ORDER — FLUCONAZOLE 150 MG PO TABS
150.0000 mg | ORAL_TABLET | Freq: Once | ORAL | Status: DC
Start: 2015-04-22 — End: 2015-09-27

## 2015-04-22 MED ORDER — TERCONAZOLE 80 MG VA SUPP
80.0000 mg | Freq: Every day | VAGINAL | Status: DC
Start: 2015-04-22 — End: 2015-09-27

## 2015-04-22 NOTE — ED Notes (Signed)
Medication refill. C/o yeast infection complaints.

## 2015-04-22 NOTE — ED Provider Notes (Signed)
CSN: [id number]     Arrival date & time 04/22/15  1302 History   First MD Initiated Contact with Patient 04/22/15 1321     Chief Complaint  Patient presents with  . Medication Refill   (Consider location/radiation/quality/duration/timing/severity/associated sxs/prior Treatment) Patient is a 34 y.o. female presenting with vaginal discharge. The history is provided by the patient.  Vaginal Discharge Quality:  White Severity:  Moderate Onset quality:  Gradual Duration:  1 week Progression:  Worsening Chronicity:  Chronic Relieved by:  None tried Worsened by:  Nothing tried Ineffective treatments:  None tried Associated symptoms: vaginal itching   Associated symptoms: no abdominal pain and no fever     Past Medical History  Diagnosis Date  . Asthma   . Hypertension   . Chlamydia   . BV (bacterial vaginosis)    History reviewed. No pertinent past surgical history. Family History  Problem Relation Age of Onset  . Diabetes Mother   . Hypertension Mother    Social History  Substance Use Topics  . Smoking status: Never Smoker   . Smokeless tobacco: None  . Alcohol Use: No   OB History    No data available     Review of Systems  Constitutional: Negative.  Negative for fever.  Gastrointestinal: Negative.  Negative for abdominal pain.  Genitourinary: Positive for vaginal discharge. Negative for vaginal bleeding and vaginal pain.  All other systems reviewed and are negative.   Allergies  Shellfish allergy  Home Medications   Prior to Admission medications   Medication Sig Start Date End Date Taking? Authorizing Provider  amLODipine (NORVASC) 10 MG tablet Take 10 mg by mouth daily. **Pt states she is taking one HTN med only; not sure which one    Historical Provider, MD  clotrimazole-betamethasone (LOTRISONE) cream Apply to affected area 2 times daily for 1 week.  Do not place inside the vagina. 03/30/15   Charm RingsErin J Honig, MD  fluconazole (DIFLUCAN) 150 MG tablet Take 1  tablet (150 mg total) by mouth once. 04/22/15   Linna HoffJames D Kindl, MD  hydrochlorothiazide (HYDRODIURIL) 25 MG tablet Take 25 mg by mouth daily.    Historical Provider, MD  phentermine (ADIPEX-P) 37.5 MG tablet Take 37.5 mg by mouth daily before breakfast.    Historical Provider, MD  terconazole (TERAZOL 3) 80 MG vaginal suppository Place 1 suppository (80 mg total) vaginally at bedtime. 04/22/15   Linna HoffJames D Kindl, MD   Meds Ordered and Administered this Visit  Medications - No data to display  LMP 03/24/2015 (Approximate) No data found.   Physical Exam  Constitutional: She is oriented to person, place, and time. She appears well-developed and well-nourished. No distress.  Neck: Normal range of motion.  Abdominal: Soft. Bowel sounds are normal.  Neurological: She is alert and oriented to person, place, and time.  Skin: Skin is warm and dry.  Nursing note and vitals reviewed.   ED Course  Procedures (including critical care time)  Labs Review Labs Reviewed - No data to display  Imaging Review No results found.   Visual Acuity Review  Right Eye Distance:   Left Eye Distance:   Bilateral Distance:    Right Eye Near:   Left Eye Near:    Bilateral Near:         MDM   1. Vaginal candida        Linna HoffJames D Kindl, MD 04/22/15 [phone]

## 2015-07-25 ENCOUNTER — Emergency Department (HOSPITAL_COMMUNITY): Payer: Worker's Compensation

## 2015-07-25 ENCOUNTER — Emergency Department (HOSPITAL_COMMUNITY)
Admission: EM | Admit: 2015-07-25 | Discharge: 2015-07-25 | Disposition: A | Discharge status: Home / Self Care | Payer: Worker's Compensation | Attending: Emergency Medicine | Admitting: Emergency Medicine

## 2015-07-25 ENCOUNTER — Encounter (HOSPITAL_COMMUNITY): Payer: Self-pay | Admitting: Emergency Medicine

## 2015-07-25 DIAGNOSIS — Y998 Other external cause status: Secondary | ICD-10-CM | POA: Insufficient documentation

## 2015-07-25 DIAGNOSIS — Z8619 Personal history of other infectious and parasitic diseases: Secondary | ICD-10-CM | POA: Diagnosis not present

## 2015-07-25 DIAGNOSIS — Z8742 Personal history of other diseases of the female genital tract: Secondary | ICD-10-CM | POA: Insufficient documentation

## 2015-07-25 DIAGNOSIS — Z79899 Other long term (current) drug therapy: Secondary | ICD-10-CM | POA: Insufficient documentation

## 2015-07-25 DIAGNOSIS — S3992XA Unspecified injury of lower back, initial encounter: Secondary | ICD-10-CM | POA: Insufficient documentation

## 2015-07-25 DIAGNOSIS — Y9389 Activity, other specified: Secondary | ICD-10-CM | POA: Diagnosis not present

## 2015-07-25 DIAGNOSIS — I10 Essential (primary) hypertension: Secondary | ICD-10-CM | POA: Diagnosis not present

## 2015-07-25 DIAGNOSIS — S199XXA Unspecified injury of neck, initial encounter: Secondary | ICD-10-CM | POA: Insufficient documentation

## 2015-07-25 DIAGNOSIS — Y92410 Unspecified street and highway as the place of occurrence of the external cause: Secondary | ICD-10-CM | POA: Insufficient documentation

## 2015-07-25 DIAGNOSIS — S0990XA Unspecified injury of head, initial encounter: Secondary | ICD-10-CM | POA: Insufficient documentation

## 2015-07-25 DIAGNOSIS — J45909 Unspecified asthma, uncomplicated: Secondary | ICD-10-CM | POA: Diagnosis not present

## 2015-07-25 LAB — RAPID URINE DRUG SCREEN, HOSP PERFORMED
AMPHETAMINES: NOT DETECTED
BENZODIAZEPINES: NOT DETECTED
Barbiturates: NOT DETECTED
COCAINE: NOT DETECTED
OPIATES: NOT DETECTED
Tetrahydrocannabinol: NOT DETECTED

## 2015-07-25 MED ORDER — CYCLOBENZAPRINE HCL 10 MG PO TABS
5.0000 mg | ORAL_TABLET | Freq: Once | ORAL | Status: AC
  Administered 2015-07-25: 5 mg via ORAL
  Filled 2015-07-25: qty 1

## 2015-07-25 MED ORDER — HYDROCODONE-ACETAMINOPHEN 5-325 MG PO TABS: 2.0000 | ORAL_TABLET | ORAL | Status: DC | PRN

## 2015-07-25 MED ORDER — CYCLOBENZAPRINE HCL 10 MG PO TABS: 10.0000 mg | ORAL_TABLET | Freq: Two times a day (BID) | ORAL | Status: DC | PRN

## 2015-07-25 MED ORDER — IBUPROFEN 800 MG PO TABS: 800.0000 mg | ORAL_TABLET | Freq: Three times a day (TID) | ORAL | Status: DC

## 2015-07-25 MED ORDER — IBUPROFEN 800 MG PO TABS
800.0000 mg | ORAL_TABLET | Freq: Once | ORAL | Status: AC
  Administered 2015-07-25: 800 mg via ORAL
  Filled 2015-07-25: qty 1
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
## 2015-07-25 NOTE — Discharge Instructions (Signed)
Take your medications as prescribed. I also recommend applying ice to affected regions for 15-20 minutes 3-4 times daily. Please follow up with a primary care provider from the Resource Guide provided below in 4-5 days. Please return to the Emergency Department if symptoms worsen or new onset of fever, numbness, tingling, groin anesthesia, loss of bowel or bladder, weakness.    Emergency Department Resource Guide 1) Find a Doctor and Pay Out of Pocket Although you won't have to find out who is covered by your insurance plan, it is a good idea to ask around and get recommendations. You will then need to call the office and see if the doctor you have chosen will accept you as a new patient and what types of options they offer for patients who are self-pay. Some doctors offer discounts or will set up payment plans for their patients who do not have insurance, but you will need to ask so you aren't surprised when you get to your appointment.  2) Contact Your Local Health Department Not all health departments have doctors that can see patients for sick visits, but many do, so it is worth a call to see if yours does. If you don't know where your local health department is, you can check in your phone book. The CDC also has a tool to help you locate your state's health department, and many state websites also have listings of all of their local health departments.  3) Find a Walk-in Clinic If your illness is not likely to be very severe or complicated, you may want to try a walk in clinic. These are popping up all over the country in pharmacies, drugstores, and shopping centers. They're usually staffed by nurse practitioners or physician assistants that have been trained to treat common illnesses and complaints. They're usually fairly quick and inexpensive. However, if you have serious medical issues or chronic medical problems, these are probably not your best option.  No Primary Care Doctor: - Call Health  Connect at  [phone] - they can help you locate a primary care doctor that  accepts your insurance, provides certain services, etc. - Physician Referral Service- [phone]  Chronic Pain Problems: Organization         Address  Phone   Notes  [facility]  [phone] Patients need to be referred by their primary care doctor.   Medication Assistance: Organization         Address  Phone   Notes  [facility] Medication [facility] [address] [place] [place], [place] [address] [phone] --Must be a resident of [facility] -- Must have NO insurance coverage whatsoever (no Medicaid/ Medicare, etc.) -- The pt. MUST have a primary care doctor that directs their care regularly and follows them in the community   MedAssist  [phone]   [employer]  [phone]    Agencies that provide inexpensive medical care: Organization         Address  Phone   Notes  [facility]  [phone]   [facility]    [phone]   [facility] [address] [place], [place] [address] [phone]   [facility] [address] [place], [place] [phone]   Planned Parenthood    [phone]   [facility]    [phone]   [facility]  [address] [place], [employer] Phone:  [phone], Fax:  [phone] Hours of Operation:  9 am - 6 pm, M-F.  Also accepts Medicaid/Medicare and self-pay.  [facility] for [place], [address], [place] Phone: [phone], Fax: [phone]. Hours of Operation:  8:30 am - 5:30 pm, M-F.  Also accepts Medicaid and self-pay.  [facility] [address], [place] Phone: [phone]   [place], [place], [place] [phone], Ext. [phone] Mondays & Thursdays: 7-9 AM.  First 15 patients are seen on a first come, first serve basis.     [place] Providers:  Organization         Address  Phone   Notes  [facility] [address],  [phone] Also accepts self-pay patients.  [facility] [address] [place], [place]  [phone]   [place], [address], [place] [phone]   [facility] [address] [place] [phone]   Lucianne Lei [address], [place]   [phone] Only accepts [place] Access [place] patients after they have their name applied to their card.   Self-Pay (no insurance) in [facility]:  Organization         Address  Phone   Notes  Sickle Cell Patients, [facility] [place] [facility] Internal Medicine [place] [phone]   [facility] [place] [phone]   [facility] [place]  [place], [place], [place] [phone]   [facility]/Dr. Osei-Bonsu  [address], [place] or [address], [place] [phone] Phone number for both [place] and [place] locations is the same.  Urgent Medical and [facility] [address] [place] [phone]   [facility] [address] [place] or [address] [phone]   [facility] [address], [place] [phone], phone; [phone], fax Sees patients 1st and 3rd Saturday of every month.  Must not qualify for public or private insurance (i.e. Medicaid, Medicare, [place] Health Choice, Veterans' Benefits)  Household income should be no more than 200% of the poverty level The clinic cannot treat you if you are pregnant or think you are pregnant  Sexually transmitted diseases are not treated at the clinic.    Dental Care: Organization         Address  Phone  Notes  [facility]  Department of [facility] [phone] Accepts children up to age 68 who are enrolled in [place] or [place]; pregnant women with a Medicaid card; and children who have applied for Medicaid or [place] Health Choice, but were declined, whose parents can pay a reduced fee at time of service.  [facility] Department of [facility]  [address], [place] [phone] Accepts children up to age 61 who are enrolled in [place] or [place]; pregnant women with a Medicaid card; and children who have applied for Medicaid or [place] Health Choice, but were declined, whose parents can pay a reduced fee at time of service.  [facility]  [place] [phone] Patients are seen by appointment only. Walk-ins  are not accepted. [place] will see patients 35 years of age and older. Monday - Tuesday (8am-5pm) Most Wednesdays (8:30-5pm) $30 per visit, cash only  [facility]  [address], [facility] [phone] Patients are seen by appointment only. Walk-ins are not accepted. [place] will see patients 30 years of age and older. One Wednesday Evening (Monthly: Volunteer Based).  $30 per visit, cash only  [place]  [phone] for adults; Children under age 99, call Graduate Pediatric Dentistry at [phone]. Children aged 48-14, please call [phone] to request a pediatric application.  Dental services are provided in all areas of dental care including fillings, crowns and bridges, complete and partial dentures, implants, gum treatment, root canals, and extractions. Preventive care is also provided. Treatment is provided to both adults and children. Patients are selected via a lottery and there is often a waiting list.   [facility] [address], [place]  [phone]  [email or web address]   [facility] [address] [place], [place] [phone], Ext. [phone] Second and Fourth Thursday of each month, opens at 6:30 AM; Clinic ends at 9 AM.  Patients are seen on a first-come first-served basis, and a limited number are seen during each clinic.   [facility]  [address] [place] Danker [place], [place] [phone]   Eligibility Requirements You must have lived in [place], [place], or [place] counties for at least the last three months.   You cannot be eligible for state or federal sponsored [employer], including [employer], [place], or [employer].   You generally cannot be eligible for healthcare insurance through your employer.    How to apply: Eligibility screenings are held every Tuesday and Wednesday afternoon from 1:00 pm until 4:00 pm. You do not need an appointment for the interview!  [facility] [address] [place], [place]   [place] Department  [place]  [phone]    Behavioral Health Resources in the Community: Intensive Outpatient Programs Organization         Address  Phone  Notes  [place] [address], [place], [place] [phone]   [facility] Outpatient [address] [place], [place]   ADS: Alcohol & Drug Svcs [address], [place], [place]   [place] [address]  [place], [place] or [phone]   Substance Abuse Resources Organization         Address  Phone  Notes  Alcohol and Drug Services  [phone]   [place]  [phone]   [facility] [place]   Chinita Pester  [phone]   Residential & Outpatient Substance Abuse Program  [phone]   Psychological Services Organization          Address  Phone  Notes  [facility] [place]  [place]  [phone]   [place] [address], [place] [phone] or [phone]    Mobile Crisis Teams Organization         Address  Phone  Notes  [facility], Mobile Crisis Care Unit  [phone]   Assertive Psychotherapeutic Services  [address] [place], [place]   [facility] [address], [place] [phone]  Self-Help/Support Groups Organization         Address  Phone             Notes  [facility] of [place] - variety of support groups  [place] Call for more information  Narcotics Anonymous (NA), [facility] [address], [employer] [place]  2 meetings at this location   Special educational needs teacher         Address  Phone  Notes  [facility] Residential Treatment [place],    [place]  [phone]   [facility]  [address] [place] [phone], [place], [place]   [place], [place] [phone] Admissions: 8am-3pm M-F  Incentives Substance [place] [address]    [place], [place] [phone]   [facility] [address] [place], [place], [place]   [facility] [address],  [place], [place]   [facility] Programs - Intensive Outpatient [address], [place], [place]   [facility] [place].,  [place], [place] [phone] or [phone]   [facility] Treatment Services (RTS) [address], [place], [place] Accepts Medicaid  [facility] [place] [address],  [place] [phone] Substance Abuse/Addiction Treatment   [facility] Organization         Address  Phone  Notes  [facility]  [phone]   Domenic Schwab, PhD [address] Arlis Porta [place], [place]   [phone] or [phone]   [place], [place] [phone]   [facility] [address] [place], [place] [phone] Insurance/Medicaid/sponsorship through [facility] and Families [address], [address] [place]                                    [place], [place] [phone] [place] [address], [place] [phone]    Dr. Adele Schilder  [phone]   [facility] Dept. 1) [address] [place] 2) [place] 3)  [place] [address], [place] [phone]  [phone]   [place] [phone] or [phone] (After Hours)
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
## 2015-09-27 ENCOUNTER — Encounter (HOSPITAL_COMMUNITY): Payer: Self-pay | Admitting: *Deleted

## 2015-09-27 ENCOUNTER — Other Ambulatory Visit (HOSPITAL_COMMUNITY)
Admission: RE | Admit: 2015-09-27 | Discharge: 2015-09-27 | Disposition: A | Discharge status: Home / Self Care | Payer: Medicare Other | Source: Ambulatory Visit | Attending: Family Medicine | Admitting: Family Medicine

## 2015-09-27 ENCOUNTER — Ambulatory Visit (INDEPENDENT_AMBULATORY_CARE_PROVIDER_SITE_OTHER)
Admission: EM | Admit: 2015-09-27 | Discharge: 2015-09-27 | Disposition: A | Discharge status: Home / Self Care | Payer: Medicare Other | Source: Home / Self Care | Attending: Family Medicine | Admitting: Family Medicine

## 2015-09-27 DIAGNOSIS — Z113 Encounter for screening for infections with a predominantly sexual mode of transmission: Secondary | ICD-10-CM | POA: Insufficient documentation

## 2015-09-27 DIAGNOSIS — N760 Acute vaginitis: Secondary | ICD-10-CM | POA: Insufficient documentation

## 2015-09-27 MED ORDER — CEFTRIAXONE SODIUM 250 MG IJ SOLR
250.0000 mg | Freq: Once | INTRAMUSCULAR | Status: AC
  Administered 2015-09-27: 250 mg via INTRAMUSCULAR

## 2015-09-27 MED ORDER — TERCONAZOLE 80 MG VA SUPP: 80.0000 mg | Freq: Every day | VAGINAL | Status: DC

## 2015-09-27 MED ORDER — CEFTRIAXONE SODIUM 250 MG IJ SOLR: 250.0000 mg | Freq: Once | INTRAMUSCULAR | Status: DC

## 2015-09-27 MED ORDER — AZITHROMYCIN 250 MG PO TABS
ORAL_TABLET | ORAL | Status: AC
Start: 2015-09-27 — End: 2015-09-27
  Filled 2015-09-27: qty 4

## 2015-09-27 MED ORDER — AZITHROMYCIN 250 MG PO TABS
1000.0000 mg | ORAL_TABLET | Freq: Once | ORAL | Status: AC
  Administered 2015-09-27: 1000 mg via ORAL

## 2015-09-27 MED ORDER — METRONIDAZOLE 500 MG PO TABS: 500.0000 mg | ORAL_TABLET | Freq: Two times a day (BID) | ORAL | Status: DC

## 2015-09-27 MED ORDER — FLUCONAZOLE 150 MG PO TABS: 150.0000 mg | ORAL_TABLET | Freq: Once | ORAL | Status: DC

## 2015-09-27 MED ORDER — AZITHROMYCIN 250 MG PO TABS: 1000.0000 mg | ORAL_TABLET | Freq: Once | ORAL | Status: DC

## 2015-09-27 MED ORDER — CEFTRIAXONE SODIUM 250 MG IJ SOLR
INTRAMUSCULAR | Status: AC
Start: 2015-09-27 — End: 2015-09-27
  Filled 2015-09-27: qty 250

## 2015-09-27 NOTE — ED Notes (Signed)
Room call #1 - [phone]

## 2015-09-27 NOTE — ED Provider Notes (Signed)
CSN: [id number]     Arrival date & time 09/27/15  1651 History   First MD Initiated Contact with Patient 09/27/15 1912     Chief Complaint  Patient presents with  . Vaginal Discharge   (Consider location/radiation/quality/duration/timing/severity/associated sxs/prior Treatment) Patient is a 35 y.o. female presenting with vaginal discharge. The history is provided by the patient.  Vaginal Discharge Quality:  Manson PasseyBrown Severity:  Mild Onset quality:  Gradual Duration:  1 week Chronicity:  Recurrent Context: not during urination and not recent antibiotic use   Relieved by:  None tried Worsened by:  Nothing tried Ineffective treatments:  None tried Associated symptoms: no dysuria and no fever     Past Medical History  Diagnosis Date  . Asthma   . Hypertension   . Chlamydia   . BV (bacterial vaginosis)    History reviewed. No pertinent past surgical history. Family History  Problem Relation Age of Onset  . Diabetes Mother   . Hypertension Mother    Social History  Substance Use Topics  . Smoking status: Never Smoker   . Smokeless tobacco: None  . Alcohol Use: No   OB History    No data available     Review of Systems  Constitutional: Negative for fever.  Gastrointestinal: Negative.   Genitourinary: Positive for vaginal discharge. Negative for dysuria, vaginal bleeding, menstrual problem and pelvic pain.  All other systems reviewed and are negative.   Allergies  Shellfish allergy  Home Medications   Prior to Admission medications   Medication Sig Start Date End Date Taking? Authorizing Provider  amLODipine (NORVASC) 10 MG tablet Take 10 mg by mouth daily. **Pt states she is taking one HTN med only; not sure which one    Historical Provider, MD  clotrimazole-betamethasone (LOTRISONE) cream Apply to affected area 2 times daily for 1 week.  Do not place inside the vagina. 03/30/15   Charm RingsErin J Honig, MD  cyclobenzaprine (FLEXERIL) 10 MG tablet Take 1 tablet (10 mg total) by  mouth 2 (two) times daily as needed for muscle spasms. 07/25/15   Barrett HenleNicole Elizabeth Nadeau, PA-C  fluconazole (DIFLUCAN) 150 MG tablet Take 1 tablet (150 mg total) by mouth once. 09/27/15   Linna HoffJames D Keevin Panebianco, MD  hydrochlorothiazide (HYDRODIURIL) 25 MG tablet Take 25 mg by mouth daily.    Historical Provider, MD  HYDROcodone-acetaminophen (NORCO/VICODIN) 5-325 MG tablet Take 2 tablets by mouth every 4 (four) hours as needed. 07/25/15   Barrett HenleNicole Elizabeth Nadeau, PA-C  metroNIDAZOLE (FLAGYL) 500 MG tablet Take 1 tablet (500 mg total) by mouth 2 (two) times daily. 09/27/15   Linna HoffJames D Saara Kijowski, MD  phentermine (ADIPEX-P) 37.5 MG tablet Take 37.5 mg by mouth daily before breakfast.    Historical Provider, MD  terconazole (TERAZOL 3) 80 MG vaginal suppository Place 1 suppository (80 mg total) vaginally at bedtime. 09/27/15   Linna HoffJames D Rexann Lueras, MD   Meds Ordered and Administered this Visit   Medications  azithromycin (ZITHROMAX) tablet 1,000 mg (not administered)  cefTRIAXone (ROCEPHIN) injection 250 mg (not administered)    BP 119/71 mmHg  Pulse 89  Temp(Src) 99.5 F (37.5 C) (Oral)  Resp 18  SpO2 100%  LMP 09/07/2015 No data found.   Physical Exam  Constitutional: She is oriented to person, place, and time. She appears well-developed and well-nourished. No distress.  Abdominal: Soft. Bowel sounds are normal. She exhibits no distension and no mass. There is no tenderness. There is no rebound and no guarding.  Genitourinary: No vaginal discharge found.  Neurological: She is alert and oriented to person, place, and time.  Skin: Skin is warm and dry.  Nursing note and vitals reviewed.   ED Course  Procedures (including critical care time)  Labs Review Labs Reviewed  URINE CYTOLOGY ANCILLARY ONLY    Imaging Review No results found.   Visual Acuity Review  Right Eye Distance:   Left Eye Distance:   Bilateral Distance:    Right Eye Near:   Left Eye Near:    Bilateral Near:         MDM    1. Vaginitis        Linna Hoff, MD 09/27/15 [phone]

## 2015-09-27 NOTE — ED Notes (Signed)
The    Pt  Has     A  Vaginal  Discharge       With some       Cramping     As  Well       foul    Smelling  Discharge  As  Well      Pt  Has  An  Allergy  To  Latex

## 2015-09-27 NOTE — Discharge Instructions (Signed)
We will call with test results and treat as indicated °

## 2015-09-29 LAB — URINE CYTOLOGY ANCILLARY ONLY
Chlamydia: NEGATIVE
Neisseria Gonorrhea: NEGATIVE
Trichomonas: NEGATIVE

## 2015-09-30 LAB — URINE CYTOLOGY ANCILLARY ONLY: Bacterial vaginitis: POSITIVE — AB

## 2015-10-05 ENCOUNTER — Telehealth (HOSPITAL_COMMUNITY): Payer: Self-pay | Admitting: Emergency Medicine

## 2015-10-05 NOTE — ED Notes (Signed)
Called pt and notified of recent lab results from visit 4/4 Pt ID'd properly... Reports feeling better and sx have subsided... Has not p/u meds  Adv pt if sx persist to p/u Rx and take them all.   Per Dr. Dayton ScrapeMurray,  Please let patient know that test for bacterial vaginosis (gardnerella) was positive. [facility]  Tests for gonorrhea/chlamydia and test for trichomonas were all negaitve. Recheck for persistent symptoms. LM  Adv pt if sx are not getting better to return  Pt verb understanding Education on safe sex given

## 2016-04-17 ENCOUNTER — Ambulatory Visit (HOSPITAL_COMMUNITY)
Admission: EM | Admit: 2016-04-17 | Discharge: 2016-04-17 | Disposition: A | Discharge status: Home / Self Care | Payer: Medicare Other | Attending: Family Medicine | Admitting: Family Medicine

## 2016-04-17 ENCOUNTER — Encounter (HOSPITAL_COMMUNITY): Payer: Self-pay | Admitting: Family Medicine

## 2016-04-17 DIAGNOSIS — Z888 Allergy status to other drugs, medicaments and biological substances status: Secondary | ICD-10-CM | POA: Diagnosis not present

## 2016-04-17 DIAGNOSIS — Z113 Encounter for screening for infections with a predominantly sexual mode of transmission: Secondary | ICD-10-CM | POA: Insufficient documentation

## 2016-04-17 DIAGNOSIS — Z202 Contact with and (suspected) exposure to infections with a predominantly sexual mode of transmission: Secondary | ICD-10-CM | POA: Diagnosis not present

## 2016-04-17 DIAGNOSIS — J45909 Unspecified asthma, uncomplicated: Secondary | ICD-10-CM | POA: Insufficient documentation

## 2016-04-17 DIAGNOSIS — Z79899 Other long term (current) drug therapy: Secondary | ICD-10-CM | POA: Insufficient documentation

## 2016-04-17 DIAGNOSIS — I10 Essential (primary) hypertension: Secondary | ICD-10-CM | POA: Diagnosis not present

## 2016-04-17 MED ORDER — AZITHROMYCIN 250 MG PO TABS
ORAL_TABLET | ORAL | Status: AC
Start: 2016-04-17 — End: 2016-04-17
  Filled 2016-04-17: qty 4

## 2016-04-17 MED ORDER — AZITHROMYCIN 250 MG PO TABS
1000.0000 mg | ORAL_TABLET | Freq: Once | ORAL | Status: AC
  Administered 2016-04-17: 1000 mg via ORAL

## 2016-04-17 MED ORDER — CEFTRIAXONE SODIUM 250 MG IJ SOLR
INTRAMUSCULAR | Status: AC
  Filled 2016-04-17: qty 250

## 2016-04-17 MED ORDER — CEFTRIAXONE SODIUM 250 MG IJ SOLR
250.0000 mg | Freq: Once | INTRAMUSCULAR | Status: AC
  Administered 2016-04-17: 250 mg via INTRAMUSCULAR

## 2016-04-17 NOTE — ED Provider Notes (Signed)
[facility]    CSN: [id number] Arrival date & time: 04/17/16  1458     History   Chief Complaint No chief complaint on file.   HPI Anna King is a 35 y.o. female.   The history is provided by the patient.  Female GU Problem  This is a new problem. Episode onset: boyfriend with dysuria, discharge, pt with no sx, pt here for check. The problem has not changed since onset.Pertinent negatives include no abdominal pain. She has tried nothing for the symptoms.    Past Medical History:  Diagnosis Date  . Asthma   . BV (bacterial vaginosis)   . Chlamydia   . Hypertension     There are no active problems to display for this patient.   No past surgical history on file.  OB History    No data available       Home Medications    Prior to Admission medications   Medication Sig Start Date End Date Taking? Authorizing Provider  amLODipine (NORVASC) 10 MG tablet Take 10 mg by mouth daily. **Pt states she is taking one HTN med only; not sure which one    Historical Provider, MD  clotrimazole-betamethasone (LOTRISONE) cream Apply to affected area 2 times daily for 1 week.  Do not place inside the vagina. 03/30/15   Charm Rings, MD  cyclobenzaprine (FLEXERIL) 10 MG tablet Take 1 tablet (10 mg total) by mouth 2 (two) times daily as needed for muscle spasms. 07/25/15   Barrett Henle, PA-C  fluconazole (DIFLUCAN) 150 MG tablet Take 1 tablet (150 mg total) by mouth once. 09/27/15   Linna Hoff, MD  hydrochlorothiazide (HYDRODIURIL) 25 MG tablet Take 25 mg by mouth daily.    Historical Provider, MD  HYDROcodone-acetaminophen (NORCO/VICODIN) 5-325 MG tablet Take 2 tablets by mouth every 4 (four) hours as needed. 07/25/15   Barrett Henle, PA-C  metroNIDAZOLE (FLAGYL) 500 MG tablet Take 1 tablet (500 mg total) by mouth 2 (two) times daily. 09/27/15   Linna Hoff, MD  phentermine (ADIPEX-P) 37.5 MG tablet Take 37.5 mg by mouth daily before breakfast.     Historical Provider, MD  terconazole (TERAZOL 3) 80 MG vaginal suppository Place 1 suppository (80 mg total) vaginally at bedtime. 09/27/15   Linna Hoff, MD    Family History Family History  Problem Relation Age of Onset  . Diabetes Mother   . Hypertension Mother     Social History Social History  Substance Use Topics  . Smoking status: Never Smoker  . Smokeless tobacco: Not on file  . Alcohol use No     Allergies   Shellfish allergy   Review of Systems Review of Systems  Constitutional: Negative.   Gastrointestinal: Negative for abdominal pain.  Genitourinary: Negative for vaginal bleeding and vaginal discharge.  All other systems reviewed and are negative.    Physical Exam Triage Vital Signs ED Triage Vitals  Enc Vitals Group     BP      Pulse      Resp      Temp      Temp src      SpO2      Weight      Height      Head Circumference      Peak Flow      Pain Score      Pain Loc      Pain Edu?      Excl. in GC?  No data found.   Updated Vital Signs There were no vitals taken for this visit.  Visual Acuity Right Eye Distance:   Left Eye Distance:   Bilateral Distance:    Right Eye Near:   Left Eye Near:    Bilateral Near:     Physical Exam  Constitutional: She is oriented to person, place, and time. She appears well-developed and well-nourished.  Abdominal: Soft. There is no tenderness.  Neurological: She is alert and oriented to person, place, and time.  Nursing note and vitals reviewed.    UC Treatments / Results  Labs (all labs ordered are listed, but only abnormal results are displayed) Labs Reviewed - No data to display  EKG  EKG Interpretation None       Radiology No results found.  Procedures Procedures (including critical care time)  Medications Ordered in UC Medications - No data to display   Initial Impression / Assessment and Plan / UC Course  I have reviewed the triage vital signs and the nursing  notes.  Pertinent labs & imaging results that were available during my care of the patient were reviewed by me and considered in my medical decision making (see chart for details).  Clinical Course      Final Clinical Impressions(s) / UC Diagnoses   Final diagnoses:  None    New Prescriptions New Prescriptions   No medications on file     Linna HoffJames D Areonna Bran, MD 04/17/16 1519

## 2016-04-17 NOTE — ED Triage Notes (Signed)
Pt here stating that she wants to be checked for STD. Denies any symptoms.

## 2016-04-17 NOTE — Discharge Instructions (Signed)
We will call with positive test results and treat as indicated  °

## 2016-04-18 LAB — URINE CYTOLOGY ANCILLARY ONLY
Chlamydia: NEGATIVE
Neisseria Gonorrhea: NEGATIVE
TRICH (WINDOWPATH): NEGATIVE

## 2016-05-24 ENCOUNTER — Telehealth (HOSPITAL_COMMUNITY): Payer: Self-pay | Admitting: Emergency Medicine

## 2016-05-24 NOTE — Telephone Encounter (Signed)
Pt called needing lab results from visit on 11/24 notified of recent lab results Pt ID'd properly... Reports feeling fine Adv pt if sx are not getting better to return or to f/u w/PCP Pt verb understanding.

## 2016-08-21 ENCOUNTER — Ambulatory Visit (HOSPITAL_COMMUNITY)
Admission: EM | Admit: 2016-08-21 | Discharge: 2016-08-21 | Disposition: A | Discharge status: Home / Self Care | Payer: Medicare Other | Attending: Family Medicine | Admitting: Family Medicine

## 2016-08-21 ENCOUNTER — Encounter (HOSPITAL_COMMUNITY): Payer: Self-pay | Admitting: Emergency Medicine

## 2016-08-21 DIAGNOSIS — S39012A Strain of muscle, fascia and tendon of lower back, initial encounter: Secondary | ICD-10-CM

## 2016-08-21 MED ORDER — HYDROCODONE-ACETAMINOPHEN 5-325 MG PO TABS: 1.0000 | ORAL_TABLET | Freq: Four times a day (QID) | ORAL | 0 refills | Status: DC | PRN

## 2016-08-21 MED ORDER — CYCLOBENZAPRINE HCL 5 MG PO TABS: 5.0000 mg | ORAL_TABLET | Freq: Three times a day (TID) | ORAL | 0 refills | Status: DC | PRN
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
## 2016-09-07 ENCOUNTER — Ambulatory Visit (HOSPITAL_COMMUNITY)
Admission: EM | Admit: 2016-09-07 | Discharge: 2016-09-07 | Disposition: A | Discharge status: Home / Self Care | Payer: Medicare Other | Attending: Emergency Medicine | Admitting: Emergency Medicine

## 2016-09-07 ENCOUNTER — Encounter (HOSPITAL_COMMUNITY): Payer: Self-pay | Admitting: Emergency Medicine

## 2016-09-07 DIAGNOSIS — Z79899 Other long term (current) drug therapy: Secondary | ICD-10-CM | POA: Insufficient documentation

## 2016-09-07 DIAGNOSIS — Z8249 Family history of ischemic heart disease and other diseases of the circulatory system: Secondary | ICD-10-CM | POA: Insufficient documentation

## 2016-09-07 DIAGNOSIS — Z975 Presence of (intrauterine) contraceptive device: Secondary | ICD-10-CM | POA: Insufficient documentation

## 2016-09-07 DIAGNOSIS — Z91013 Allergy to seafood: Secondary | ICD-10-CM | POA: Insufficient documentation

## 2016-09-07 DIAGNOSIS — Z833 Family history of diabetes mellitus: Secondary | ICD-10-CM | POA: Insufficient documentation

## 2016-09-07 DIAGNOSIS — N760 Acute vaginitis: Secondary | ICD-10-CM | POA: Diagnosis not present

## 2016-09-07 DIAGNOSIS — B9689 Other specified bacterial agents as the cause of diseases classified elsewhere: Secondary | ICD-10-CM | POA: Diagnosis not present

## 2016-09-07 DIAGNOSIS — Z113 Encounter for screening for infections with a predominantly sexual mode of transmission: Secondary | ICD-10-CM | POA: Insufficient documentation

## 2016-09-07 DIAGNOSIS — I10 Essential (primary) hypertension: Secondary | ICD-10-CM | POA: Diagnosis not present

## 2016-09-07 DIAGNOSIS — J45909 Unspecified asthma, uncomplicated: Secondary | ICD-10-CM | POA: Insufficient documentation

## 2016-09-07 DIAGNOSIS — Z114 Encounter for screening for human immunodeficiency virus [HIV]: Secondary | ICD-10-CM | POA: Diagnosis not present

## 2016-09-07 MED ORDER — LIDOCAINE HCL (PF) 1 % IJ SOLN
INTRAMUSCULAR | Status: AC
  Filled 2016-09-07: qty 2

## 2016-09-07 MED ORDER — CEFTRIAXONE SODIUM 250 MG IJ SOLR
250.0000 mg | Freq: Once | INTRAMUSCULAR | Status: AC
  Administered 2016-09-07: 250 mg via INTRAMUSCULAR

## 2016-09-07 MED ORDER — AZITHROMYCIN 250 MG PO TABS
1000.0000 mg | ORAL_TABLET | Freq: Once | ORAL | Status: AC
  Administered 2016-09-07: 1000 mg via ORAL

## 2016-09-07 MED ORDER — METRONIDAZOLE 500 MG PO TABS: 500.0000 mg | ORAL_TABLET | Freq: Two times a day (BID) | ORAL | 0 refills | Status: AC

## 2016-09-07 MED ORDER — FLUCONAZOLE 150 MG PO TABS: 150.0000 mg | ORAL_TABLET | Freq: Once | ORAL | 1 refills | Status: AC

## 2016-09-07 MED ORDER — CEFTRIAXONE SODIUM 250 MG IJ SOLR
INTRAMUSCULAR | Status: AC
  Filled 2016-09-07: qty 250

## 2016-09-07 MED ORDER — AZITHROMYCIN 250 MG PO TABS
ORAL_TABLET | ORAL | Status: AC
Start: 2016-09-07 — End: 2016-09-07
  Filled 2016-09-07: qty 4

## 2016-09-07 NOTE — ED Notes (Signed)
Urine sample collected

## 2016-09-07 NOTE — ED Triage Notes (Signed)
[facility] a complaint of a vaginal odor, pain and itching x 1 week.

## 2016-09-07 NOTE — Discharge Instructions (Signed)
Take the medication as written. Give us a working phone number so that we can contact you if needed. Refrain from sexual contact until you know your results and your partner(s) are treated if necessary. Return to the ED if you get worse, have a fever >100.4, or for any concerns.  ° °Go to www.goodrx.com to look up your medications. This will give you a list of where you can find your prescriptions at the most affordable prices.   °

## 2016-09-07 NOTE — ED Provider Notes (Signed)
HPI  SUBJECTIVE:  Anna King is a 36 y.o. female who presents with 1 week of vaginal odor, discharge.  No dysuria, urgency, frequency, cloudy or oderous urine, hematuria,  genital blisters, vaginal itching. No aggravating, alleviating factors. Has not tried anything for this. No fevers, N/V, abd pain, pelvic pain, back pain. No recent abx use. Pt sexually active with new female partner who is asxatic to her knowledge. Thinks that he may be bisexual. does not use condoms consistently. STD's  a concern today. She has a past medical history Chlamydia, Trichomonas, BV, yeast, hypertension, asthma. No history of PID, ectopic pregnancies, diabetes, HIV, HSV, syphilis. LMP: 2/27. Denies possibility of being pregnant. She has a Mirena IUD. PMD: Dr. Doreatha MartinSam in [place].    Past Medical History:  Diagnosis Date  . Asthma   . BV (bacterial vaginosis)   . Chlamydia   . Hypertension     History reviewed. No pertinent surgical history.  Family History  Problem Relation Age of Onset  . Diabetes Mother   . Hypertension Mother     Social History  Substance Use Topics  . Smoking status: Never Smoker  . Smokeless tobacco: Never Used  . Alcohol use No     Current Facility-Administered Medications:  .  azithromycin (ZITHROMAX) tablet 1,000 mg, 1,000 mg, Oral, Once, Domenick GongAshley Mearl Harewood, MD .  cefTRIAXone (ROCEPHIN) injection 250 mg, 250 mg, Intramuscular, Once, Domenick GongAshley Earnestine Shipp, MD  Current Outpatient Prescriptions:  .  amLODipine (NORVASC) 10 MG tablet, Take 10 mg by mouth daily. **Pt states she is taking one HTN med only; not sure which one, Disp: , Rfl:  .  fluconazole (DIFLUCAN) 150 MG tablet, Take 1 tablet (150 mg total) by mouth once. 1 tab po x 1. May repeat in 72 hours if no improvement, Disp: 2 tablet, Rfl: 1 .  metroNIDAZOLE (FLAGYL) 500 MG tablet, Take 1 tablet (500 mg total) by mouth 2 (two) times daily., Disp: 14 tablet, Rfl: 0  Allergies  Allergen Reactions  . Shellfish Allergy  Anaphylaxis     ROS  As noted in HPI.   Physical Exam  BP 128/78 (BP Location: Right Arm)   Pulse 82   Temp 98.5 F (36.9 C) (Oral)   Resp 20   LMP 08/21/2016 (Exact Date)   SpO2 96%   Constitutional: Well developed, well nourished, no acute distress Eyes:  EOMI, conjunctiva normal bilaterally HENT: Normocephalic, atraumatic,mucus membranes moist Respiratory: Normal inspiratory effort Cardiovascular: Normal rate GI: nondistended soft, nontender. No suprapubic Or lower abdominal tenderness  back: No CVA tenderness GU: External genitalia normal.  Normal vaginal mucosa.  Normal os. Scant thin oderous white vaginal discharge.   Uterus smooth, NT. No CMT. No adnexal tenderness. No adnexal masses.  Chaperone present during exam skin: No rash, skin intact Musculoskeletal: no deformities Neurologic: Alert & oriented x 3, no focal neuro deficits Psychiatric: Speech and behavior appropriate   ED Course   Medications  azithromycin (ZITHROMAX) tablet 1,000 mg (not administered)  cefTRIAXone (ROCEPHIN) injection 250 mg (not administered)    Orders Placed This Encounter  Procedures  . HIV antibody    Standing Status:   Standing    Number of Occurrences:   1  . RPR    Standing Status:   Standing    Number of Occurrences:   1    No results found for this or any previous visit (from the past 24 hour(s)). No results found.  ED Clinical Impression  BV (bacterial vaginosis)  Screening examination  for STD (sexually transmitted disease)   ED Assessment/Plan  H&P most c/w BV. Will treat empirically for Gonorrhea and chlamydia because this is a concern of hers today.  Sent off GC/chlamydia, wet prep, HIV, RPR.  Giving ceftriaxone 250 mg IM, azithro 1 gm po We'll send her home with Diflucan in addition to the Flagyl in case she develops a yeast infection from the antibiotics today. Advised pt to refrain from sexual contact until she knows lab results, symptoms resolve, and  partner(s) are treated if necessary. Pt provided working phone number. Follow-up with PMD as needed. Discussed labs, MDM, plan and followup with patient. Pt agrees with plan.   Meds ordered this encounter  Medications  . azithromycin (ZITHROMAX) tablet 1,000 mg  . cefTRIAXone (ROCEPHIN) injection 250 mg  . metroNIDAZOLE (FLAGYL) 500 MG tablet    Sig: Take 1 tablet (500 mg total) by mouth 2 (two) times daily.    Dispense:  14 tablet    Refill:  0  . fluconazole (DIFLUCAN) 150 MG tablet    Sig: Take 1 tablet (150 mg total) by mouth once. 1 tab po x 1. May repeat in 72 hours if no improvement    Dispense:  2 tablet    Refill:  1    *This clinic note was created using Scientist, clinical (histocompatibility and immunogenetics). Therefore, there may be occasional mistakes despite careful proofreading.  ?    Domenick Gong, MD 09/07/16 1346

## 2016-09-08 LAB — RPR: RPR: NONREACTIVE

## 2016-09-08 LAB — HIV ANTIBODY (ROUTINE TESTING W REFLEX): HIV SCREEN 4TH GENERATION: NONREACTIVE

## 2016-09-10 LAB — CERVICOVAGINAL ANCILLARY ONLY
Chlamydia: NEGATIVE
Neisseria Gonorrhea: NEGATIVE
Wet Prep (BD Affirm): POSITIVE — AB

## 2016-11-07 ENCOUNTER — Encounter (HOSPITAL_COMMUNITY): Payer: Self-pay

## 2016-11-07 ENCOUNTER — Inpatient Hospital Stay (HOSPITAL_COMMUNITY)
Admission: AD | Admit: 2016-11-07 | Discharge: 2016-11-08 | Disposition: A | Discharge status: Home / Self Care | Payer: Medicare Other | Source: Ambulatory Visit | Attending: Obstetrics and Gynecology | Admitting: Obstetrics and Gynecology

## 2016-11-07 DIAGNOSIS — Z91013 Allergy to seafood: Secondary | ICD-10-CM | POA: Insufficient documentation

## 2016-11-07 DIAGNOSIS — Z79899 Other long term (current) drug therapy: Secondary | ICD-10-CM | POA: Insufficient documentation

## 2016-11-07 DIAGNOSIS — I10 Essential (primary) hypertension: Secondary | ICD-10-CM | POA: Diagnosis not present

## 2016-11-07 DIAGNOSIS — N939 Abnormal uterine and vaginal bleeding, unspecified: Secondary | ICD-10-CM | POA: Insufficient documentation

## 2016-11-07 LAB — URINALYSIS, MICROSCOPIC (REFLEX)

## 2016-11-07 LAB — URINALYSIS, ROUTINE W REFLEX MICROSCOPIC
Bilirubin Urine: NEGATIVE
GLUCOSE, UA: NEGATIVE mg/dL
KETONES UR: NEGATIVE mg/dL
LEUKOCYTES UA: NEGATIVE
Nitrite: NEGATIVE
PH: 6 (ref 5.0–8.0)
Protein, ur: NEGATIVE mg/dL
Specific Gravity, Urine: 1.025 (ref 1.005–1.030)

## 2016-11-07 LAB — WET PREP, GENITAL
Sperm: NONE SEEN
TRICH WET PREP: NONE SEEN
Yeast Wet Prep HPF POC: NONE SEEN

## 2016-11-07 LAB — CBC
HCT: 36.8 % (ref 36.0–46.0)
Hemoglobin: 11.6 g/dL — ABNORMAL LOW (ref 12.0–15.0)
MCH: 23.4 pg — ABNORMAL LOW (ref 26.0–34.0)
MCHC: 31.5 g/dL (ref 30.0–36.0)
MCV: 74.2 fL — ABNORMAL LOW (ref 78.0–100.0)
PLATELETS: 326 10*3/uL (ref 150–400)
RBC: 4.96 MIL/uL (ref 3.87–5.11)
RDW: 18 % — ABNORMAL HIGH (ref 11.5–15.5)
WBC: 5.2 10*3/uL (ref 4.0–10.5)

## 2016-11-07 LAB — POCT PREGNANCY, URINE: Preg Test, Ur: NEGATIVE

## 2016-11-07 MED ORDER — IBUPROFEN 800 MG PO TABS: 800.0000 mg | ORAL_TABLET | Freq: Once | ORAL | Status: DC

## 2016-11-07 NOTE — MAU Provider Note (Signed)
History     CSN: [id number]  Arrival date and time: 11/07/16 1955   None     Chief Complaint  Patient presents with  . Vaginal Bleeding   Anna King is a 36 y.o. Who presents today with a vaginal bleeding. She states that she had a cycle around 10/15/16, and that lasted about one week. Then she started bleeding again on 10/29/16, and has been bleeding since. Last period prior to period in April was about the end of March.    Vaginal Bleeding  The patient's primary symptoms include pelvic pain and vaginal bleeding. This is a new problem. The current episode started in the past 7 days. The problem has been unchanged. Pain severity now: 5/10  The problem affects both sides. She is not pregnant. Pertinent negatives include no chills, dysuria, fever, frequency, nausea, urgency or vomiting. The vaginal discharge was bloody. The vaginal bleeding is heavier than menses. She has been passing clots (about the size of a grape. ). She has not been passing tissue. Nothing aggravates the symptoms. She has tried nothing for the symptoms. She is sexually active. It is unknown whether or not her partner has an STD. She uses an IUD (Mirena, placed 2014. ) for contraception. Menstrual history: prior to episode in May periods have been regular, and has had a period every month with the Mirena.     Past Medical History:  Diagnosis Date  . Asthma   . BV (bacterial vaginosis)   . Chlamydia   . Hypertension     History reviewed. No pertinent surgical history.  Family History  Problem Relation Age of Onset  . Diabetes Mother   . Hypertension Mother     Social History  Substance Use Topics  . Smoking status: Never Smoker  . Smokeless tobacco: Never Used  . Alcohol use No    Allergies:  Allergies  Allergen Reactions  . Shellfish Allergy Anaphylaxis    Prescriptions Prior to Admission  Medication Sig Dispense Refill Last Dose  . amLODipine (NORVASC) 10 MG tablet Take 10 mg by mouth daily.  **Pt states she is taking one HTN med only; not sure which one   09/07/2016 at Unknown time    Review of Systems  Constitutional: Negative for chills and fever.  Gastrointestinal: Negative for nausea and vomiting.  Genitourinary: Positive for pelvic pain and vaginal bleeding. Negative for dysuria, frequency and urgency.   Physical Exam   Blood pressure 128/81, pulse 67, temperature 98.4 F (36.9 C), temperature source Oral, resp. rate 18, height 5\' 7"  (1.702 m), weight (!) 346 lb (156.9 kg), last menstrual period 10/29/2016, SpO2 100 %.  Physical Exam  Nursing note and vitals reviewed. Constitutional: She is oriented to person, place, and time. She appears well-developed and well-nourished. No distress.  HENT:  Head: Normocephalic.  Cardiovascular: Normal rate.   Respiratory: Effort normal.  GI: Soft. There is no tenderness. There is no rebound.  Genitourinary:  Genitourinary Comments:  External: no lesion Vagina: small amount of blood seen  Cervix: pink, smooth, no CMT, IUD tail seen at the ext os.  Uterus: NSSC Adnexa: NT   Neurological: She is alert and oriented to person, place, and time.  Skin: Skin is warm and dry.  Psychiatric: She has a normal mood and affect.   Results for orders placed or performed during the hospital encounter of 11/07/16 (from the past 24 hour(s))  Urinalysis, Routine w reflex microscopic     Status: Abnormal   Collection Time:  11/07/16  8:40 PM  Result Value Ref Range   Color, Urine YELLOW YELLOW   APPearance CLEAR CLEAR   Specific Gravity, Urine 1.025 1.005 - 1.030   pH 6.0 5.0 - 8.0   Glucose, UA NEGATIVE NEGATIVE mg/dL   Hgb urine dipstick LARGE (A) NEGATIVE   Bilirubin Urine NEGATIVE NEGATIVE   Ketones, ur NEGATIVE NEGATIVE mg/dL   Protein, ur NEGATIVE NEGATIVE mg/dL   Nitrite NEGATIVE NEGATIVE   Leukocytes, UA NEGATIVE NEGATIVE  Urinalysis, Microscopic (reflex)     Status: Abnormal   Collection Time: 11/07/16  8:40 PM  Result Value  Ref Range   RBC / HPF 6-30 0 - 5 RBC/hpf   WBC, UA 0-5 0 - 5 WBC/hpf   Bacteria, UA RARE (A) NONE SEEN   Squamous Epithelial / LPF 0-5 (A) NONE SEEN   Ca Oxalate Crys, UA PRESENT   Pregnancy, urine POC     Status: None   Collection Time: 11/07/16  8:54 PM  Result Value Ref Range   Preg Test, Ur NEGATIVE NEGATIVE  Wet prep, genital     Status: Abnormal   Collection Time: 11/07/16 11:20 PM  Result Value Ref Range   Yeast Wet Prep HPF POC NONE SEEN NONE SEEN   Trich, Wet Prep NONE SEEN NONE SEEN   Clue Cells Wet Prep HPF POC PRESENT (A) NONE SEEN   WBC, Wet Prep HPF POC FEW (A) NONE SEEN   Sperm NONE SEEN   CBC     Status: Abnormal   Collection Time: 11/07/16 11:29 PM  Result Value Ref Range   WBC 5.2 4.0 - 10.5 K/uL   RBC 4.96 3.87 - 5.11 MIL/uL   Hemoglobin 11.6 (L) 12.0 - 15.0 g/dL   HCT 16.1 09.6 - 04.5 %   MCV 74.2 (L) 78.0 - 100.0 fL   MCH 23.4 (L) 26.0 - 34.0 pg   MCHC 31.5 30.0 - 36.0 g/dL   RDW 40.9 (H) 81.1 - 91.4 %   Platelets 326 150 - 400 K/uL    MAU Course  Procedures  MDM Patient has had ibuprofen. She is feeling better.   Assessment and Plan   1. Abnormal uterine bleeding (AUB)    DC home Comfort measures reviewed  Bleeding precautions Menstrual calendar  RX: none, OTC ibuprofen as needed  Return to MAU as needed FU with OB as planned  Follow-up Information    [facility] up.   Specialty:  Obstetrics and Gynecology Contact information: [address] [place] [address] [phone]           Thressa Sheller 11/08/2016, 12:21 AM

## 2016-11-07 NOTE — MAU Note (Signed)
Pt c/o vaginal bleeding. States this started on 10/29/2016 and this is the second "cycle" she has had in 1 month. Pt states she has some lower abdominal cramping-rates 5/10. Has not taken anything for cramping. Denies hx of irregular bleeding.

## 2016-11-08 DIAGNOSIS — N939 Abnormal uterine and vaginal bleeding, unspecified: Secondary | ICD-10-CM | POA: Diagnosis not present

## 2016-11-08 LAB — GC/CHLAMYDIA PROBE AMP (~~LOC~~) NOT AT ARMC
Chlamydia: NEGATIVE
Neisseria Gonorrhea: NEGATIVE

## 2017-08-15 ENCOUNTER — Ambulatory Visit (HOSPITAL_COMMUNITY)
Admission: EM | Admit: 2017-08-15 | Discharge: 2017-08-15 | Disposition: A | Discharge status: Home / Self Care | Payer: Medicare Other | Attending: Family Medicine | Admitting: Family Medicine

## 2017-08-15 ENCOUNTER — Encounter (HOSPITAL_COMMUNITY): Payer: Self-pay | Admitting: Emergency Medicine

## 2017-08-15 DIAGNOSIS — Z91013 Allergy to seafood: Secondary | ICD-10-CM | POA: Insufficient documentation

## 2017-08-15 DIAGNOSIS — N898 Other specified noninflammatory disorders of vagina: Secondary | ICD-10-CM

## 2017-08-15 DIAGNOSIS — Z975 Presence of (intrauterine) contraceptive device: Secondary | ICD-10-CM | POA: Diagnosis not present

## 2017-08-15 DIAGNOSIS — Z711 Person with feared health complaint in whom no diagnosis is made: Secondary | ICD-10-CM

## 2017-08-15 DIAGNOSIS — I10 Essential (primary) hypertension: Secondary | ICD-10-CM | POA: Diagnosis not present

## 2017-08-15 DIAGNOSIS — J45909 Unspecified asthma, uncomplicated: Secondary | ICD-10-CM | POA: Insufficient documentation

## 2017-08-15 DIAGNOSIS — Z79899 Other long term (current) drug therapy: Secondary | ICD-10-CM | POA: Insufficient documentation

## 2017-08-15 LAB — POCT PREGNANCY, URINE: Preg Test, Ur: NEGATIVE

## 2017-08-15 MED ORDER — STERILE WATER FOR INJECTION IJ SOLN
INTRAMUSCULAR | Status: AC
  Filled 2017-08-15: qty 10

## 2017-08-15 MED ORDER — CEFTRIAXONE SODIUM 250 MG IJ SOLR
INTRAMUSCULAR | Status: AC
  Filled 2017-08-15: qty 250

## 2017-08-15 MED ORDER — CEFTRIAXONE SODIUM 250 MG IJ SOLR
250.0000 mg | Freq: Once | INTRAMUSCULAR | Status: AC
  Administered 2017-08-15: 250 mg via INTRAMUSCULAR

## 2017-08-15 MED ORDER — AZITHROMYCIN 250 MG PO TABS
1000.0000 mg | ORAL_TABLET | Freq: Once | ORAL | Status: AC
  Administered 2017-08-15: 1000 mg via ORAL

## 2017-08-15 MED ORDER — METRONIDAZOLE 500 MG PO TABS: 500.0000 mg | ORAL_TABLET | Freq: Two times a day (BID) | ORAL | 0 refills | Status: AC

## 2017-08-15 MED ORDER — AZITHROMYCIN 250 MG PO TABS
ORAL_TABLET | ORAL | Status: AC
  Filled 2017-08-15: qty 4

## 2017-08-15 NOTE — ED Triage Notes (Signed)
PT reports brown vaginal discharge and odor for 1 week.

## 2017-08-15 NOTE — ED Provider Notes (Signed)
[facility]    CSN: [id number] Arrival date & time: 08/15/17  1038     History   Chief Complaint Chief Complaint  Patient presents with  . Vaginal Discharge    HPI Anna King is a 37 y.o. female.   Anna King presents with complaints of brown vaginal discharge which has an odor which started 1 week ago. States feels similar to when she has had BV in the past. Denies vaginal pain, itching, urinary symptoms, abdominal pain, back pain or fevers. She states she has had some vaginal spotting. She has an IUD in place. LMP 2/2. She states she does occasionally have some spotting. She is sexually active with one partner. Does not use condoms. She states she is concerned about STD's and would like treatment today.    ROS per HPI.       Past Medical History:  Diagnosis Date  . Asthma   . BV (bacterial vaginosis)   . Chlamydia   . Hypertension     There are no active problems to display for this patient.   History reviewed. No pertinent surgical history.  OB History    No data available       Home Medications    Prior to Admission medications   Medication Sig Start Date End Date Taking? Authorizing Provider  amLODipine (NORVASC) 10 MG tablet Take 10 mg by mouth daily. **Pt states she is taking one HTN med only; not sure which one    [provider]  metroNIDAZOLE (FLAGYL) 500 MG tablet Take 1 tablet (500 mg total) by mouth 2 (two) times daily for 7 days. 08/15/17 08/22/17  Georgetta HaberBurky, Shuan Statzer B, NP    Family History Family History  Problem Relation Age of Onset  . Diabetes Mother   . Hypertension Mother     Social History Social History   Tobacco Use  . Smoking status: Never Smoker  . Smokeless tobacco: Never Used  Substance Use Topics  . Alcohol use: No  . Drug use: No     Allergies   Shellfish allergy   Review of Systems Review of Systems   Physical Exam Triage Vital Signs ED Triage Vitals  Enc Vitals Group     BP 08/15/17  1059 (!) 136/100     Pulse Rate 08/15/17 1059 69     Resp 08/15/17 1059 16     Temp 08/15/17 1059 98.1 F (36.7 C)     Temp Source 08/15/17 1059 Oral     SpO2 08/15/17 1059 99 %     Weight 08/15/17 1100 (!) 345 lb (156.5 kg)     Height --      Head Circumference --      Peak Flow --      Pain Score --      Pain Loc --      Pain Edu? --      Excl. in GC? --    No data found.  Updated Vital Signs BP (!) 136/100   Pulse 69   Temp 98.1 F (36.7 C) (Oral)   Resp 16   Wt (!) 345 lb (156.5 kg)   LMP 07/27/2017 Comment: IUD  SpO2 99%   BMI 54.03 kg/m   Visual Acuity Right Eye Distance:   Left Eye Distance:   Bilateral Distance:    Right Eye Near:   Left Eye Near:    Bilateral Near:     Physical Exam  Constitutional: She is oriented to person, place, and time.  She appears well-developed and well-nourished. No distress.  Cardiovascular: Normal rate, regular rhythm and normal heart sounds.  Pulmonary/Chest: Effort normal and breath sounds normal.  Abdominal: Soft. There is no tenderness. There is no rigidity and no CVA tenderness.  Denies abdominal pain, vaginal pain or lesions; gu exam deferred  Neurological: She is alert and oriented to person, place, and time.  Skin: Skin is warm and dry.     UC Treatments / Results  Labs (all labs ordered are listed, but only abnormal results are displayed) Labs Reviewed  URINE CYTOLOGY ANCILLARY ONLY    EKG  EKG Interpretation None       Radiology No results found.  Procedures Procedures (including critical care time)  Medications Ordered in UC Medications  azithromycin (ZITHROMAX) tablet 1,000 mg (not administered)  cefTRIAXone (ROCEPHIN) injection 250 mg (not administered)     Initial Impression / Assessment and Plan / UC Course  I have reviewed the triage vital signs and the nursing notes.  Pertinent labs & imaging results that were available during my care of the patient were reviewed by me and considered  in my medical decision making (see chart for details).     zithromax and rocephin provided empirically in clinic today. Urine cytology pending. Will notify of any positive findings and if any changes to treatment are needed.  If symptoms worsen or do not improve in the next week to return to be seen or to follow up with PCP.  Patient verbalized understanding and agreeable to plan.    Final Clinical Impressions(s) / UC Diagnoses   Final diagnoses:  Vaginal discharge  Concern about STD in female without diagnosis    ED Discharge Orders        Ordered    metroNIDAZOLE (FLAGYL) 500 MG tablet  2 times daily     08/15/17 1142       Controlled Substance Prescriptions [place] Controlled Substance Registry consulted? Not Applicable   Georgetta Haber, NP 08/15/17 1149

## 2017-08-15 NOTE — Discharge Instructions (Signed)
We have treated you today for gonorrhea and chlamydia.  Will notify you of any positive findings and if any changes to treatment are needed.   Please complete course of treatment for bacterial vaginosis as well.  Do not drink alcohol while taking this medication.  Please withhold from intercourse for the next week. Please use condoms to prevent STD's.   If symptoms worsen or do not improve in the next week to return to be seen or to follow up with PCP.

## 2017-08-16 LAB — URINE CYTOLOGY ANCILLARY ONLY
CHLAMYDIA, DNA PROBE: NEGATIVE
NEISSERIA GONORRHEA: NEGATIVE
TRICH (WINDOWPATH): NEGATIVE

## 2017-08-20 LAB — URINE CYTOLOGY ANCILLARY ONLY
BACTERIAL VAGINITIS: NEGATIVE
CANDIDA VAGINITIS: NEGATIVE

## 2017-08-27 ENCOUNTER — Encounter (HOSPITAL_COMMUNITY): Payer: Self-pay

## 2017-11-12 ENCOUNTER — Ambulatory Visit (HOSPITAL_COMMUNITY)
Admission: RE | Admit: 2017-11-12 | Discharge: 2017-11-12 | Disposition: A | Discharge status: Home / Self Care | Payer: Medicare Other | Source: Ambulatory Visit | Attending: Obstetrics and Gynecology | Admitting: Obstetrics and Gynecology

## 2017-11-12 ENCOUNTER — Encounter (HOSPITAL_COMMUNITY): Payer: Self-pay

## 2017-11-12 VITALS — BP 114/70 | Ht 66.0 in

## 2017-11-12 DIAGNOSIS — R87810 Cervical high risk human papillomavirus (HPV) DNA test positive: Secondary | ICD-10-CM

## 2017-11-12 DIAGNOSIS — Z1239 Encounter for other screening for malignant neoplasm of breast: Secondary | ICD-10-CM

## 2017-11-12 DIAGNOSIS — R87610 Atypical squamous cells of undetermined significance on cytologic smear of cervix (ASC-US): Secondary | ICD-10-CM

## 2017-11-12 NOTE — Progress Notes (Signed)
Patient referred to [facility] by the [facility] Department due to having an abnormal Pap smear on 08/27/2017 that a colposcopy is recommended for follow-up.  Pap Smear: Pap smear not completed today. Last Pap smear was 08/27/2017 at the [facility] Department and ASCUS with positive HPV. Referred [facility] to the [facility] for a colposcopy to follow-up for abnormal Pap smear. Appointment scheduled for Tuesday, Nov 19, 2017 at 1455. Last Pap smear result is in Epic.  Physical exam: Breasts Breasts symmetrical. No skin abnormalities bilateral breasts. No nipple retraction bilateral breasts. No nipple discharge bilateral breasts. No lymphadenopathy. No lumps palpated bilateral breasts. No complaints of pain or tenderness on exam. Screening mammogram recommended at age 15 unless clinically indicated prior.        Pelvic/Bimanual No Pap smear completed today since last Pap smear and HPV typing was 08/27/2017. Pap smear not indicated per B[facility]CP guidelines.   Smoking History: Patient has never smoked.  Patient Navigation: Patient education provided. Access to services provided for patient through B[facility]CP program.   Breast and Cervical Cancer Risk Assessment: Patient has no family history of breast cancer, known genetic mutations, or radiation treatment to the chest before age 61. Patient has no history of cervical dysplasia, immunocompromised, or DES exposure in-utero. Breast cancer risk assessment completed but no risk scores calculated due to patient being under 35.

## 2017-11-12 NOTE — Patient Instructions (Signed)
Explained breast self awareness with Anna King. Patient did not need a Pap smear today due to last Pap smear and HPV typing was 08/27/2017. Explained the colposcopy the recommended follow-up for her abnormal Pap smear. Referred [facility] to the [facility] for a colposcopy to follow-up for abnormal Pap smear. Appointment scheduled for Tuesday, Nov 19, 2017 at 1455. Patient aware of appointment and will be there. Let patient know she will need a screening mammogram at age 37 unless clinically indicated prior. Anna King verbalized understanding.  King, Anna Maser, RN 2:15 PM

## 2017-11-19 ENCOUNTER — Encounter: Payer: Self-pay | Admitting: Obstetrics and Gynecology

## 2017-11-19 ENCOUNTER — Ambulatory Visit (INDEPENDENT_AMBULATORY_CARE_PROVIDER_SITE_OTHER): Payer: Medicare Other | Admitting: Obstetrics and Gynecology

## 2017-11-19 ENCOUNTER — Other Ambulatory Visit (HOSPITAL_COMMUNITY)
Admission: RE | Admit: 2017-11-19 | Discharge: 2017-11-19 | Disposition: A | Discharge status: Home / Self Care | Payer: Medicare Other | Source: Ambulatory Visit | Attending: Obstetrics and Gynecology | Admitting: Obstetrics and Gynecology

## 2017-11-19 VITALS — BP 135/77 | HR 55 | Wt 313.6 lb

## 2017-11-19 DIAGNOSIS — R87612 Low grade squamous intraepithelial lesion on cytologic smear of cervix (LGSIL): Secondary | ICD-10-CM

## 2017-11-19 LAB — POCT PREGNANCY, URINE: Preg Test, Ur: NEGATIVE

## 2017-11-19 NOTE — Progress Notes (Signed)
    GYNECOLOGY CLINIC COLPOSCOPY PROCEDURE NOTE  37 y.o. [id number] here for colposcopy for ASCUS with POSITIVE high risk HPV pap smear on 08/27/17. Discussed role for HPV in cervical dysplasia, need for surveillance.  Patient given informed consent, signed copy in the chart, time out was performed.  Placed in lithotomy position. Cervix viewed with speculum and colposcope after application of acetic acid.   Colposcopy adequate? Yes   Acetowhite lesions? no Punctation? no Mosaicism?  no Abnormal vasculature?  no Biopsies? no ECC? yes Complications? no   All specimens were labeled and sent to pathology.  Patient was given post procedure instructions.  Will follow up pathology and manage accordingly; patient will be contacted with results and recommendations.  Routine preventative health maintenance measures emphasized. Would recommend repeat pap in a year.    Caryl Ada, DO OB [facility], [facility] 11/19/2017, 4:57 PM

## 2017-11-19 NOTE — Patient Instructions (Signed)
Colposcopy, Care After  This sheet gives you information about how to care for yourself after your procedure. Your doctor may also give you more specific instructions. If you have problems or questions, contact your doctor.  What can I expect after the procedure?  If you did not have a tissue sample removed (did not have a biopsy), you may only have some spotting for a few days. You can go back to your normal activities.  If you had a tissue sample removed, it is common to have:  · Soreness and pain. This may last for a few days.  · Light-headedness.  · Mild bleeding from your vagina or dark-colored, grainy discharge from your vagina. This may last for a few days. You may need to wear a sanitary pad.  · Spotting for at least 48 hours after the procedure.    Follow these instructions at home:  · Take over-the-counter and prescription medicines only as told by your doctor. Ask your doctor what medicines you can start taking again. This is very important if you take blood-thinning medicine.  · Do not drive or use heavy machinery while taking prescription pain medicine.  · For 3 days, or as long as your doctor tells you, avoid:  ? Douching.  ? Using tampons.  ? Having sex.  · If you use birth control (contraception), keep using it.  · Limit activity for the first day after the procedure. Ask your doctor what activities are safe for you.  · It is up to you to get the results of your procedure. Ask your doctor when your results will be ready.  · Keep all follow-up visits as told by your doctor. This is important.  Contact a doctor if:  · You get a skin rash.  Get help right away if:  · You are bleeding a lot from your vagina. It is a lot of bleeding if you are using more than one pad an hour for 2 hours in a row.  · You have clumps of blood (blood clots) coming from your vagina.  · You have a fever.  · You have chills  · You have pain in your lower belly (pelvic area).  · You have signs of infection, such as vaginal  discharge that is:  ? Different than usual.  ? Yellow.  ? Bad-smelling.  · You have very pain or cramps in your lower belly that do not get better with medicine.  · You feel light-headed.  · You feel dizzy.  · You pass out (faint).  Summary  · If you did not have a tissue sample removed (did not have a biopsy), you may only have some spotting for a few days. You can go back to your normal activities.  · If you had a tissue sample removed, it is common to have mild pain and spotting for 48 hours.  · For 3 days, or as long as your doctor tells you, avoid douching, using tampons and having sex.  · Get help right away if you have bleeding, very bad pain, or signs of infection.  This information is not intended to replace advice given to you by your health care provider. Make sure you discuss any questions you have with your health care provider.  Document Released: 11/28/2007 Document Revised: 02/29/2016 Document Reviewed: 02/29/2016  Elsevier Interactive Patient Education © 2018 [employer]

## 2017-11-27 ENCOUNTER — Encounter: Payer: Self-pay | Admitting: *Deleted

## 2017-12-02 ENCOUNTER — Encounter (HOSPITAL_COMMUNITY): Payer: Self-pay | Admitting: *Deleted

## 2018-02-03 ENCOUNTER — Encounter (HOSPITAL_COMMUNITY): Payer: Self-pay | Admitting: Emergency Medicine

## 2018-02-03 ENCOUNTER — Ambulatory Visit (HOSPITAL_COMMUNITY)
Admission: EM | Admit: 2018-02-03 | Discharge: 2018-02-03 | Disposition: A | Discharge status: Home / Self Care | Payer: Medicare Other | Attending: Family Medicine | Admitting: Family Medicine

## 2018-02-03 DIAGNOSIS — H1031 Unspecified acute conjunctivitis, right eye: Secondary | ICD-10-CM | POA: Diagnosis not present

## 2018-02-03 DIAGNOSIS — I10 Essential (primary) hypertension: Secondary | ICD-10-CM | POA: Diagnosis not present

## 2018-02-03 MED ORDER — TOBRAMYCIN 0.3 % OP OINT: 1.0000 "application " | TOPICAL_OINTMENT | Freq: Three times a day (TID) | OPHTHALMIC | 0 refills | Status: DC

## 2018-02-03 MED ORDER — TETRACAINE HCL 0.5 % OP SOLN
OPHTHALMIC | Status: AC
Start: 2018-02-03 — End: 2018-02-03
  Filled 2018-02-03: qty 4
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
## 2018-02-03 NOTE — Discharge Instructions (Signed)
Use eye medicine 3-4 times a day until the pain and redness go away See eye doctor if you fail to improve

## 2018-02-03 NOTE — ED Triage Notes (Signed)
Pt here for right eye redness and irritation with drainage; pt talking on cell phone during triage

## 2018-02-05 ENCOUNTER — Other Ambulatory Visit: Payer: Self-pay | Admitting: Family Medicine

## 2018-02-18 ENCOUNTER — Ambulatory Visit (HOSPITAL_COMMUNITY)
Admission: EM | Admit: 2018-02-18 | Discharge: 2018-02-18 | Disposition: A | Discharge status: Home / Self Care | Payer: Medicare Other | Attending: Family Medicine | Admitting: Family Medicine

## 2018-02-18 ENCOUNTER — Encounter (HOSPITAL_COMMUNITY): Payer: Self-pay | Admitting: Emergency Medicine

## 2018-02-18 DIAGNOSIS — M436 Torticollis: Secondary | ICD-10-CM | POA: Diagnosis not present

## 2018-02-18 DIAGNOSIS — M62838 Other muscle spasm: Secondary | ICD-10-CM | POA: Diagnosis not present

## 2018-02-18 MED ORDER — KETOROLAC TROMETHAMINE 60 MG/2ML IM SOLN
60.0000 mg | Freq: Once | INTRAMUSCULAR | Status: AC
  Administered 2018-02-18: 60 mg via INTRAMUSCULAR

## 2018-02-18 MED ORDER — NAPROXEN 500 MG PO TABS: 500.0000 mg | ORAL_TABLET | Freq: Two times a day (BID) | ORAL | 0 refills | Status: DC

## 2018-02-18 MED ORDER — CYCLOBENZAPRINE HCL 10 MG PO TABS: 10.0000 mg | ORAL_TABLET | Freq: Two times a day (BID) | ORAL | 0 refills | Status: DC | PRN

## 2018-02-18 MED ORDER — KETOROLAC TROMETHAMINE 60 MG/2ML IM SOLN
INTRAMUSCULAR | Status: AC
  Filled 2018-02-18: qty 2

## 2018-02-18 NOTE — ED Provider Notes (Signed)
[facility]   [id number] 02/18/18 Arrival Time: 1525  WU:[id number]:JOINT PAIN  SUBJECTIVE: History from: patient. Nilsa NuttingLatisha F Fox is a 37 y.o. female complains of neck pain that began this morning.  Denies a precipitating event or specific injury.  Localizes the pain to the right side of neck.  Describes the pain as constant and sharp in character.  Has NOT tried OTC medications.  Symptoms are made worse with ROM.  Complains of associated HA.  Denies similar symptoms in the past.  Denies fever, chills, erythema, ecchymosis, effusion, weakness, numbness and tingling.      ROS: As per HPI.  Past Medical History:  Diagnosis Date  . Asthma   . BV (bacterial vaginosis)   . Chlamydia   . Hypertension    History reviewed. No pertinent surgical history. Allergies  Allergen Reactions  . Shellfish Allergy Anaphylaxis   No current facility-administered medications on file prior to encounter.    Current Outpatient Medications on File Prior to Encounter  Medication Sig Dispense Refill  . amLODipine (NORVASC) 10 MG tablet Take 10 mg by mouth daily. **Pt states she is taking one HTN med only; not sure which one    . tobramycin (TOBREX) 0.3 % ophthalmic ointment Place 1 application into both eyes 3 (three) times daily. 3.5 g 0   Social History   Socioeconomic History  . Marital status: Single    Spouse name: Not on file  . Number of children: Not on file  . Years of education: Not on file  . Highest education level: Not on file  Occupational History  . Not on file  Social Needs  . Financial resource strain: Not on file  . Food insecurity:    Worry: Not on file    Inability: Not on file  . Transportation needs:    Medical: Not on file    Non-medical: Not on file  Tobacco Use  . Smoking status: Never Smoker  . Smokeless tobacco: Never Used  Substance and Sexual Activity  . Alcohol use: No  . Drug use: No  . Sexual activity: Yes    Birth control/protection: Condom  Lifestyle  .  Physical activity:    Days per week: Not on file    Minutes per session: Not on file  . Stress: Not on file  Relationships  . Social connections:    Talks on phone: Not on file    Gets together: Not on file    Attends religious service: Not on file    Active member of club or organization: Not on file    Attends meetings of clubs or organizations: Not on file    Relationship status: Not on file  . Intimate partner violence:    Fear of current or ex partner: Not on file    Emotionally abused: Not on file    Physically abused: Not on file    Forced sexual activity: Not on file  Other Topics Concern  . Not on file  Social History Narrative  . Not on file   Family History  Problem Relation Age of Onset  . Diabetes Mother   . Hypertension Mother     OBJECTIVE:  Vitals:   02/18/18 1603  BP: (!) 147/98  Pulse: 87  Resp: 20  Temp: 98.2 F (36.8 C)  TempSrc: Oral  SpO2: 100%    General appearance: AOx3; in no acute distress; appears uncomfortable  Head: NCAT Lungs: CTA bilaterally Heart: RRR.  Clear S1 and S2 without murmur,  gallops, or rubs.  Radial pulses 2+ bilaterally. Musculoskeletal: Neck Inspection: Skin warm, dry, clear and intact without obvious erythema, effusion, or ecchymosis.  Palpation: Tender to palpation about the lateral right aspect of the cervical musculature; no midline tenderness ROM: LROM Strength: 5/5 shld abduction, 5/5 shld adduction, 5/5 elbow flexion, 5/5 elbow extension, 5/5 grip strength Skin: warm and dry Neurologic: Ambulates without difficulty; Sensation intact about the upper extremities Psychological: alert and cooperative; normal mood and affect  ASSESSMENT & PLAN:  1. Torticollis   2. Muscle spasms of neck     Meds ordered this encounter  Medications  . ketorolac (TORADOL) injection 60 mg  . naproxen (NAPROSYN) 500 MG tablet    Sig: Take 1 tablet (500 mg total) by mouth 2 (two) times daily.    Dispense:  30 tablet    Refill:   0    Order Specific Question:   Supervising Provider    Answer:   Isa Rankin [phone]  . cyclobenzaprine (FLEXERIL) 10 MG tablet    Sig: Take 1 tablet (10 mg total) by mouth 2 (two) times daily as needed for muscle spasms.    Dispense:  20 tablet    Refill:  0    Order Specific Question:   Supervising Provider    Answer:   Isa Rankin [[id number]]   Toradol shot given in office Continue conservative management of rest, ice, heat, and gentle stretches Take naproxen as needed for pain relief (may cause abdominal discomfort, ulcers, and GI bleeds avoid taking with other NSAIDs) Take cyclobenzaprine at nighttime for symptomatic relief. Avoid driving or operating heavy machinery while using medication. Follow up with PCP if symptoms persist Return or go to the ER if you have any new or worsening symptoms (fever, chills, chest pain, abdominal pain, changes in bowel or bladder habits, pain radiating into lower legs, etc...)   Reviewed expectations re: course of current medical issues. Questions answered. Outlined signs and symptoms indicating need for more acute intervention. Patient verbalized understanding. After Visit Summary given.    Rennis Harding, PA-C 02/18/18 1624

## 2018-02-18 NOTE — ED Triage Notes (Signed)
Pt here for neck pain starting this morning worse on the right side

## 2018-02-18 NOTE — Discharge Instructions (Signed)
Toradol shot given in office °Continue conservative management of rest, ice, heat, and gentle stretches °Take naproxen as needed for pain relief (may cause abdominal discomfort, ulcers, and GI bleeds avoid taking with other NSAIDs) °Take cyclobenzaprine at nighttime for symptomatic relief. Avoid driving or operating heavy machinery while using medication. °Follow up with PCP if symptoms persist °Return or go to the ER if you have any new or worsening symptoms (fever, chills, chest pain, abdominal pain, changes in bowel or bladder habits, pain radiating into lower legs, etc...)  °

## 2018-04-27 ENCOUNTER — Emergency Department (HOSPITAL_COMMUNITY)
Admission: EM | Admit: 2018-04-27 | Discharge: 2018-04-27 | Disposition: A | Discharge status: Home / Self Care | Payer: Medicare Other | Attending: Emergency Medicine | Admitting: Emergency Medicine

## 2018-04-27 ENCOUNTER — Other Ambulatory Visit: Payer: Self-pay

## 2018-04-27 DIAGNOSIS — R51 Headache: Secondary | ICD-10-CM | POA: Insufficient documentation

## 2018-04-27 DIAGNOSIS — Z5321 Procedure and treatment not carried out due to patient leaving prior to being seen by health care provider: Secondary | ICD-10-CM | POA: Insufficient documentation

## 2018-04-27 NOTE — ED Notes (Signed)
Minutes after triage, pt ambulated out of the ED by herself and no longer wants to be seen.
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
## 2018-05-12 ENCOUNTER — Encounter (HOSPITAL_COMMUNITY): Payer: Self-pay | Admitting: Emergency Medicine

## 2018-05-12 ENCOUNTER — Ambulatory Visit (HOSPITAL_COMMUNITY)
Admission: EM | Admit: 2018-05-12 | Discharge: 2018-05-12 | Disposition: A | Discharge status: Home / Self Care | Payer: Medicare Other | Attending: Family Medicine | Admitting: Family Medicine

## 2018-05-12 ENCOUNTER — Other Ambulatory Visit: Payer: Self-pay

## 2018-05-12 DIAGNOSIS — I10 Essential (primary) hypertension: Secondary | ICD-10-CM | POA: Diagnosis not present

## 2018-05-12 DIAGNOSIS — Z3201 Encounter for pregnancy test, result positive: Secondary | ICD-10-CM | POA: Diagnosis not present

## 2018-05-12 LAB — POCT PREGNANCY, URINE: Preg Test, Ur: POSITIVE — AB

## 2018-05-12 MED ORDER — PRENATAL COMPLETE 14-0.4 MG PO TABS: 1.0000 | ORAL_TABLET | Freq: Every day | ORAL | 0 refills | Status: DC

## 2018-05-12 MED ORDER — LABETALOL HCL 100 MG PO TABS: 100.0000 mg | ORAL_TABLET | Freq: Two times a day (BID) | ORAL | 0 refills | Status: DC

## 2018-05-12 NOTE — Discharge Instructions (Signed)
Please stop your Atenolol and start labetalol for your blood pressure.  Start prenatal vitamin daily.  Please reach out to your primary care provider and/or your OB to ensure your blood pressure is managed and to set up first prenatal vitamin.  Avoid alcohol or smoking  [facility]  [address] [place], [place] [address] Phone: [phone]

## 2018-05-12 NOTE — ED Triage Notes (Signed)
Pt reports bilateral nipple hardness and tenderness all this month.  Pt is unclear about her last period last month.  She states that it was maybe around October 8.  Pt is concerned for pregnancy.

## 2018-05-12 NOTE — ED Provider Notes (Signed)
[facility]    CSN: [id number] Arrival date & time: 05/12/18  1019     History   Chief Complaint Chief Complaint  Patient presents with  . Breast Pain    bilateral    HPI Anna King is a 37 y.o. female.   Debbe OdeaLatisha presents with concerns for pregnancy. She states her last period was the beginning of last month, approximately 10/8, and has not had a period yet this month. She feels that her "nipples are always hard" which is unusual for her. She feels she is pregnant. Nausea, no vomiting. No pelvic pain, no vaginal bleeding. She has had two other pregnancies with two living children ages 5710 and 7215. She is not on birth control. She is on BP medication but not taking any other medications. She states she is hopeful for a negative test here today. Hx of asthma, bv, htn.    ROS per HPI.      Past Medical History:  Diagnosis Date  . Asthma   . BV (bacterial vaginosis)   . Chlamydia   . Hypertension     Patient Active Problem List   Diagnosis Date Noted  . Essential hypertension 02/03/2018    History reviewed. No pertinent surgical history.  OB History    Gravida  2   Para  2   Term  2   Preterm      AB      Living  2     SAB      TAB      Ectopic      Multiple      Live Births  2            Home Medications    Prior to Admission medications   Medication Sig Start Date End Date Taking? Authorizing Provider  atenolol (TENORMIN) 100 MG tablet Take 100 mg by mouth daily.   Yes [provider]  cyclobenzaprine (FLEXERIL) 10 MG tablet Take 1 tablet (10 mg total) by mouth 2 (two) times daily as needed for muscle spasms. 02/18/18  Yes Wurst, [place]itt[place], PA-C  naproxen (NAPROSYN) 500 MG tablet Take 1 tablet (500 mg total) by mouth 2 (two) times daily. 02/18/18  Yes Wurst, [place], PA-C  labetalol (NORMODYNE) 100 MG tablet Take 1 tablet (100 mg total) by mouth 2 (two) times daily. 05/12/18   Georgetta HaberBurky, Semaya Vida B, NP  Prenatal Vit-Fe  Fumarate-FA (PRENATAL COMPLETE) 14-0.4 MG TABS Take 1 tablet by mouth daily. 05/12/18   Georgetta HaberBurky, Laqueena Hinchey B, NP    Family History Family History  Problem Relation Age of Onset  . Diabetes Mother   . Hypertension Mother     Social History Social History   Tobacco Use  . Smoking status: Never Smoker  . Smokeless tobacco: Never Used  Substance Use Topics  . Alcohol use: No  . Drug use: No     Allergies   Shellfish allergy   Review of Systems Review of Systems   Physical Exam Triage Vital Signs ED Triage Vitals  Enc Vitals Group     BP 05/12/18 1107 140/84     Pulse Rate 05/12/18 1107 65     Resp --      Temp 05/12/18 1107 98.1 F (36.7 C)     Temp Source 05/12/18 1107 Oral     SpO2 05/12/18 1107 100 %     Weight --      Height --      Head Circumference --  Peak Flow --      Pain Score 05/12/18 1108 5     Pain Loc --      Pain Edu? --      Excl. in GC? --    No data found.  Updated Vital Signs BP 140/84 (BP Location: Left Wrist)   Pulse 65   Temp 98.1 F (36.7 C) (Oral)   LMP 04/01/2018 (Approximate)   SpO2 100%    Physical Exam  Constitutional: She is oriented to person, place, and time. She appears well-developed and well-nourished. No distress.  Cardiovascular: Normal rate, regular rhythm and normal heart sounds.  Pulmonary/Chest: Effort normal and breath sounds normal.  Abdominal: Soft. There is no tenderness.  Neurological: She is alert and oriented to person, place, and time.  Skin: Skin is warm and dry.     UC Treatments / Results  Labs (all labs ordered are listed, but only abnormal results are displayed) Labs Reviewed  POCT PREGNANCY, URINE - Abnormal; Notable for the following components:      Result Value   Preg Test, Ur POSITIVE (*)    All other components within normal limits    EKG None  Radiology No results found.  Procedures Procedures (including critical care time)  Medications Ordered in UC Medications - No  data to display  Initial Impression / Assessment and Plan / UC Course  I have reviewed the triage vital signs and the nursing notes.  Pertinent labs & imaging results that were available during my care of the patient were reviewed by me and considered in my medical decision making (see chart for details).     Positive urine pregnancy. Per LMP of 10/8- 5w 6d pregnant. No abdominal pain or vaginal bleeding. Change of medication to labetalol for BP medication, to start prenatal vitamin daily. Patient is tearful and upset about this news of pregnancy, requesting options for possible termination. [facility]. Encouraged close follow up with PCP and/or ob for BP management and first prenatal. Return precautions provided. Patient verbalized understanding and agreeable to plan.    Final Clinical Impressions(s) / UC Diagnoses   Final diagnoses:  Positive pregnancy test     Discharge Instructions     Please stop your Atenolol and start labetalol for your blood pressure.  Start prenatal vitamin daily.  Please reach out to your primary care provider and/or your OB to ensure your blood pressure is managed and to set up first prenatal vitamin.  Avoid alcohol or smoking  [facility]  [address] [place], [place] [address] Phone: [phone]   ED Prescriptions    Medication Sig Dispense Auth. Provider   labetalol (NORMODYNE) 100 MG tablet Take 1 tablet (100 mg total) by mouth 2 (two) times daily. 20 tablet Linus Mako B, NP   Prenatal Vit-Fe Fumarate-FA (PRENATAL COMPLETE) 14-0.4 MG TABS Take 1 tablet by mouth daily. 60 each Georgetta Haber, NP     Controlled Substance Prescriptions [place] Controlled Substance Registry consulted? Not Applicable   Georgetta Haber, NP 05/12/18 1212

## 2018-05-26 ENCOUNTER — Inpatient Hospital Stay (HOSPITAL_COMMUNITY): Payer: Medicare Other

## 2018-05-26 ENCOUNTER — Encounter (HOSPITAL_COMMUNITY): Payer: Self-pay | Admitting: *Deleted

## 2018-05-26 ENCOUNTER — Inpatient Hospital Stay (HOSPITAL_COMMUNITY)
Admission: AD | Admit: 2018-05-26 | Discharge: 2018-05-26 | Disposition: A | Discharge status: Home / Self Care | Payer: Medicare Other | Source: Ambulatory Visit | Attending: Obstetrics and Gynecology | Admitting: Obstetrics and Gynecology

## 2018-05-26 ENCOUNTER — Other Ambulatory Visit: Payer: Self-pay

## 2018-05-26 DIAGNOSIS — N760 Acute vaginitis: Secondary | ICD-10-CM

## 2018-05-26 DIAGNOSIS — O26891 Other specified pregnancy related conditions, first trimester: Secondary | ICD-10-CM | POA: Diagnosis present

## 2018-05-26 DIAGNOSIS — O26899 Other specified pregnancy related conditions, unspecified trimester: Secondary | ICD-10-CM

## 2018-05-26 DIAGNOSIS — R109 Unspecified abdominal pain: Secondary | ICD-10-CM | POA: Diagnosis not present

## 2018-05-26 DIAGNOSIS — Z3A01 Less than 8 weeks gestation of pregnancy: Secondary | ICD-10-CM | POA: Diagnosis not present

## 2018-05-26 DIAGNOSIS — B9689 Other specified bacterial agents as the cause of diseases classified elsewhere: Secondary | ICD-10-CM

## 2018-05-26 DIAGNOSIS — O10919 Unspecified pre-existing hypertension complicating pregnancy, unspecified trimester: Secondary | ICD-10-CM

## 2018-05-26 LAB — CBC WITH DIFFERENTIAL/PLATELET
Basophils Absolute: 0 10*3/uL (ref 0.0–0.1)
Basophils Relative: 1 %
EOS ABS: 0.1 10*3/uL (ref 0.0–0.5)
Eosinophils Relative: 1 %
HCT: 38.9 % (ref 36.0–46.0)
Hemoglobin: 12.7 g/dL (ref 12.0–15.0)
LYMPHS ABS: 2.2 10*3/uL (ref 0.7–4.0)
Lymphocytes Relative: 37 %
MCH: 26.8 pg (ref 26.0–34.0)
MCHC: 32.6 g/dL (ref 30.0–36.0)
MCV: 82.2 fL (ref 80.0–100.0)
MONOS PCT: 5 %
Monocytes Absolute: 0.3 10*3/uL (ref 0.1–1.0)
Neutro Abs: 3.4 10*3/uL (ref 1.7–7.7)
Neutrophils Relative %: 56 %
Platelets: 293 10*3/uL (ref 150–400)
RBC: 4.73 MIL/uL (ref 3.87–5.11)
RDW: 16.2 % — ABNORMAL HIGH (ref 11.5–15.5)
WBC: 6 10*3/uL (ref 4.0–10.5)
nRBC: 0 % (ref 0.0–0.2)

## 2018-05-26 LAB — URINALYSIS, ROUTINE W REFLEX MICROSCOPIC
BILIRUBIN URINE: NEGATIVE
GLUCOSE, UA: NEGATIVE mg/dL
Hgb urine dipstick: NEGATIVE
KETONES UR: NEGATIVE mg/dL
Nitrite: NEGATIVE
Protein, ur: NEGATIVE mg/dL
Specific Gravity, Urine: 1.026 (ref 1.005–1.030)
pH: 6 (ref 5.0–8.0)

## 2018-05-26 LAB — COMPREHENSIVE METABOLIC PANEL
ALT: 12 U/L (ref 0–44)
ANION GAP: 7 (ref 5–15)
AST: 17 U/L (ref 15–41)
Albumin: 3.1 g/dL — ABNORMAL LOW (ref 3.5–5.0)
Alkaline Phosphatase: 71 U/L (ref 38–126)
BILIRUBIN TOTAL: 0.6 mg/dL (ref 0.3–1.2)
BUN: 8 mg/dL (ref 6–20)
CALCIUM: 8.6 mg/dL — AB (ref 8.9–10.3)
CO2: 23 mmol/L (ref 22–32)
Chloride: 106 mmol/L (ref 98–111)
Creatinine, Ser: 0.67 mg/dL (ref 0.44–1.00)
Glucose, Bld: 83 mg/dL (ref 70–99)
Potassium: 3.6 mmol/L (ref 3.5–5.1)
Sodium: 136 mmol/L (ref 135–145)
TOTAL PROTEIN: 7.9 g/dL (ref 6.5–8.1)

## 2018-05-26 LAB — WET PREP, GENITAL
Trich, Wet Prep: NONE SEEN
YEAST WET PREP: NONE SEEN

## 2018-05-26 LAB — HCG, QUANTITATIVE, PREGNANCY: HCG, BETA CHAIN, QUANT, S: 30687 m[IU]/mL — AB (ref <5)

## 2018-05-26 LAB — ABO/RH: ABO/RH(D): O POS

## 2018-05-26 MED ORDER — METRONIDAZOLE 500 MG PO TABS: 500.0000 mg | ORAL_TABLET | Freq: Two times a day (BID) | ORAL | 0 refills | Status: DC

## 2018-05-26 NOTE — MAU Provider Note (Signed)
History     CSN: [id number]  Arrival date and time: 05/26/18 1350   First Provider Initiated Contact with Patient 05/26/18 1459      Chief Complaint  Patient presents with  . Abdominal Pain  . Nausea  . Heartburn   HPI   Ms.Anna King is a 37 y.o. female [id number] @ 6660w5d here with nausea, proof of pregnancy, heartburn, and lower abdominal pain that sometimes radiates around to her lower back. The pain comes and goes. She has not taken anything for the pain. No bleeding.   OB History    Gravida  3   Para  2   Term  2   Preterm      AB      Living  2     SAB      TAB      Ectopic      Multiple      Live Births  2           Past Medical History:  Diagnosis Date  . Asthma   . BV (bacterial vaginosis)   . Chlamydia   . Hypertension     Past Surgical History:  Procedure Laterality Date  . WISDOM TOOTH EXTRACTION      Family History  Problem Relation Age of Onset  . Diabetes Mother   . Hypertension Mother     Social History   Tobacco Use  . Smoking status: Never Smoker  . Smokeless tobacco: Never Used  Substance Use Topics  . Alcohol use: No  . Drug use: No    Allergies:  Allergies  Allergen Reactions  . Shellfish Allergy Anaphylaxis    Medications Prior to Admission  Medication Sig Dispense Refill Last Dose  . atenolol (TENORMIN) 100 MG tablet Take 100 mg by mouth daily.   05/12/2018 at Unknown time  . cyclobenzaprine (FLEXERIL) 10 MG tablet Take 1 tablet (10 mg total) by mouth 2 (two) times daily as needed for muscle spasms. 20 tablet 0 Past Week at Unknown time  . labetalol (NORMODYNE) 100 MG tablet Take 1 tablet (100 mg total) by mouth 2 (two) times daily. 20 tablet 0   . naproxen (NAPROSYN) 500 MG tablet Take 1 tablet (500 mg total) by mouth 2 (two) times daily. 30 tablet 0 Past Week at Unknown time  . Prenatal Vit-Fe Fumarate-FA (PRENATAL COMPLETE) 14-0.4 MG TABS Take 1 tablet by mouth daily. 60 each 0    Results for orders  placed or performed during the hospital encounter of 05/26/18 (from the past 48 hour(s))  Urinalysis, Routine w reflex microscopic     Status: Abnormal   Collection Time: 05/26/18  2:27 PM  Result Value Ref Range   Color, Urine YELLOW YELLOW   APPearance HAZY (A) CLEAR   Specific Gravity, Urine 1.026 1.005 - 1.030   pH 6.0 5.0 - 8.0   Glucose, UA NEGATIVE NEGATIVE mg/dL   Hgb urine dipstick NEGATIVE NEGATIVE   Bilirubin Urine NEGATIVE NEGATIVE   Ketones, ur NEGATIVE NEGATIVE mg/dL   Protein, ur NEGATIVE NEGATIVE mg/dL   Nitrite NEGATIVE NEGATIVE   Leukocytes, UA SMALL (A) NEGATIVE   RBC / HPF 0-5 0 - 5 RBC/hpf   WBC, UA 6-10 0 - 5 WBC/hpf   Bacteria, UA RARE (A) NONE SEEN   Squamous Epithelial / LPF 21-50 0 - 5   Mucus PRESENT     Comment: Performed at [facility], [address] [place], [place] [address]  Wet prep, genital  Status: Abnormal   Collection Time: 05/26/18  3:08 PM  Result Value Ref Range   Yeast Wet Prep HPF POC NONE SEEN NONE SEEN   Trich, Wet Prep NONE SEEN NONE SEEN   Clue Cells Wet Prep HPF POC PRESENT (A) NONE SEEN   WBC, Wet Prep HPF POC FEW (A) NONE SEEN   Sperm PRESENT     Comment: Performed at [facility], [address], [place], [place] [address]  CBC with Differential/Platelet     Status: Abnormal   Collection Time: 05/26/18  3:38 PM  Result Value Ref Range   WBC 6.0 4.0 - 10.5 K/uL   RBC 4.73 3.87 - 5.11 MIL/uL   Hemoglobin 12.7 12.0 - 15.0 g/dL   HCT 60.4 54.0 - 98.1 %   MCV 82.2 80.0 - 100.0 fL   MCH 26.8 26.0 - 34.0 pg   MCHC 32.6 30.0 - 36.0 g/dL   RDW 19.1 (H) 47.8 - 29.5 %   Platelets 293 150 - 400 K/uL   nRBC 0.0 0.0 - 0.2 %   Neutrophils Relative % 56 %   Neutro Abs 3.4 1.7 - 7.7 K/uL   Lymphocytes Relative 37 %   Lymphs Abs 2.2 0.7 - 4.0 K/uL   Monocytes Relative 5 %   Monocytes Absolute 0.3 0.1 - 1.0 K/uL   Eosinophils Relative 1 %   Eosinophils Absolute 0.1 0.0 - 0.5 K/uL   Basophils Relative 1 %   Basophils  Absolute 0.0 0.0 - 0.1 K/uL    Comment: Performed at [facility], [address] [place], [place] [address]  Comprehensive metabolic panel     Status: Abnormal   Collection Time: 05/26/18  3:38 PM  Result Value Ref Range   Sodium 136 135 - 145 mmol/L   Potassium 3.6 3.5 - 5.1 mmol/L   Chloride 106 98 - 111 mmol/L   CO2 23 22 - 32 mmol/L   Glucose, Bld 83 70 - 99 mg/dL   BUN 8 6 - 20 mg/dL   Creatinine, Ser 8.65 0.44 - 1.00 mg/dL   Calcium 8.6 (L) 8.9 - 10.3 mg/dL   Total Protein 7.9 6.5 - 8.1 g/dL   Albumin 3.1 (L) 3.5 - 5.0 g/dL   AST 17 15 - 41 U/L   ALT 12 0 - 44 U/L   Alkaline Phosphatase 71 38 - 126 U/L   Total Bilirubin 0.6 0.3 - 1.2 mg/dL   GFR calc non Af Amer >60 >60 mL/min   GFR calc Af Amer >60 >60 mL/min   Anion gap 7 5 - 15    Comment: Performed at [facility], [address] [place], [place] [address]  ABO/Rh     Status: None (Preliminary result)   Collection Time: 05/26/18  3:47 PM  Result Value Ref Range   ABO/RH(D)      O POS Performed at [facility], [address] [place], [place] [address]    US Ob Less Than 14 Weeks With Ob Transvaginal  Result Date: 05/26/2018 CLINICAL DATA:  Abdominal pain in pregnancy. Eight week 5 day gestational age by LMP. EXAM: OBSTETRIC <14 WK [place] AND TRANSVAGINAL OB US TECHNIQUE: Both transabdominal and transvaginal ultrasound examinations were performed for complete evaluation of the gestation as well as the maternal uterus, adnexal regions, and pelvic cul-de-sac. Transvaginal technique was performed to assess early pregnancy. COMPARISON:  None. FINDINGS: Intrauterine gestational sac: Single Yolk sac:  Visualized. Embryo:  Visualized. Cardiac Activity: Visualized. Heart Rate: 132 bpm CRL:  10 mm   7 w   1 d                  [place] EDC: 01/11/2019 Subchorionic hemorrhage:  None visualized. Maternal uterus/adnexae: Both ovaries are normal in appearance. No adnexal mass identified. Tiny amount of simple free fluid is seen in  the cul-de-sac. IMPRESSION: Single living IUP measuring 7 weeks 1 day, with [place] EDC of 01/11/2019. No significant maternal uterine or adnexal abnormality identified. Electronically Signed   By: Myles Rosenthal M.D.   On: 05/26/2018 16:52    Review of Systems  Constitutional: Negative for fever.  Gastrointestinal: Positive for abdominal pain and nausea. Negative for vomiting.  Genitourinary: Negative for dysuria and vaginal bleeding.   Physical Exam   Blood pressure 129/86, pulse 69, temperature 98.3 F (36.8 C), temperature source Oral, resp. rate 18, weight (!) 152.4 kg, last menstrual period 03/26/2018, SpO2 100 %.  Physical Exam  Constitutional: She is oriented to person, place, and time. She appears well-developed and well-nourished. No distress.  HENT:  Head: Normocephalic.  Eyes: Pupils are equal, round, and reactive to light.  GI: Soft. She exhibits no distension. There is no tenderness. There is no rebound and no guarding.  Musculoskeletal: Normal range of motion.  Neurological: She is alert and oriented to person, place, and time.  Skin: Skin is warm. She is not diaphoretic.  Psychiatric: Her behavior is normal.   MAU Course  Procedures None  MDM  Wet prep & GC HIV, CBC, Hcg, ABO US OB transvaginal   Assessment and Plan   A:  1. Bacterial vaginosis   2. Abdominal pain in pregnancy, antepartum   3. Chronic hypertension affecting pregnancy     P:  Discharge home in stable condition High risk pregnancy however patient unsure if she will keep the pregnancy, considering abortion. Call the clinic for high risk appointment if needed.  Return to MAU if symptoms worsen  Continue labetalol 100 mg BID RX: Flagyl Prenatal vitamins daily  Tionne Dayhoff, Harolyn Rutherford, NP 05/28/2018 5:40 PM

## 2018-05-26 NOTE — MAU Note (Signed)
Feels sick.  Is gassy.  Stomach been hurting, cramping so bad.  Having heartburns real bad too.

## 2018-05-26 NOTE — Discharge Instructions (Signed)

## 2018-05-27 LAB — GC/CHLAMYDIA PROBE AMP (~~LOC~~) NOT AT ARMC
Chlamydia: NEGATIVE
Neisseria Gonorrhea: NEGATIVE

## 2018-05-27 LAB — HIV ANTIBODY (ROUTINE TESTING W REFLEX): HIV Screen 4th Generation wRfx: NONREACTIVE

## 2018-06-06 ENCOUNTER — Emergency Department (HOSPITAL_COMMUNITY)
Admission: EM | Admit: 2018-06-06 | Discharge: 2018-06-06 | Disposition: A | Discharge status: Home / Self Care | Payer: Medicare Other | Attending: Emergency Medicine | Admitting: Emergency Medicine

## 2018-06-06 ENCOUNTER — Encounter (HOSPITAL_COMMUNITY): Payer: Self-pay

## 2018-06-06 DIAGNOSIS — M545 Low back pain: Secondary | ICD-10-CM | POA: Diagnosis present

## 2018-06-06 DIAGNOSIS — O99511 Diseases of the respiratory system complicating pregnancy, first trimester: Secondary | ICD-10-CM | POA: Insufficient documentation

## 2018-06-06 DIAGNOSIS — O9989 Other specified diseases and conditions complicating pregnancy, childbirth and the puerperium: Secondary | ICD-10-CM | POA: Diagnosis not present

## 2018-06-06 DIAGNOSIS — Z3A09 9 weeks gestation of pregnancy: Secondary | ICD-10-CM | POA: Diagnosis not present

## 2018-06-06 DIAGNOSIS — J45909 Unspecified asthma, uncomplicated: Secondary | ICD-10-CM | POA: Insufficient documentation

## 2018-06-06 DIAGNOSIS — M7918 Myalgia, other site: Secondary | ICD-10-CM

## 2018-06-06 DIAGNOSIS — Z79899 Other long term (current) drug therapy: Secondary | ICD-10-CM | POA: Insufficient documentation

## 2018-06-06 DIAGNOSIS — O10011 Pre-existing essential hypertension complicating pregnancy, first trimester: Secondary | ICD-10-CM | POA: Diagnosis not present

## 2018-06-06 NOTE — Discharge Instructions (Signed)
Take tylenol and use warm compresses for your symptoms.   Follow up with your ob-gyn.   Return to the emergency department if you have any abdominal pain/cramping, vaginal bleeding, worsening back pain.

## 2018-06-06 NOTE — ED Notes (Signed)
RN attempted to assess fetal heart tones but was unable to hear a heartbeat. Pt reports she has not been to any prenatal appointments. RN attempted multiple spots to assess for feta heart tones and was unable to audibly hear any tones. Pt's body habitus and gestational age may also be a factors in being unable to detect heart tones.

## 2018-06-06 NOTE — ED Notes (Signed)
Bed: WTR7 Expected date:  Expected time:  Means of arrival:  Comments: 
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
## 2018-06-13 ENCOUNTER — Encounter (HOSPITAL_COMMUNITY): Payer: Self-pay | Admitting: *Deleted

## 2018-06-13 ENCOUNTER — Inpatient Hospital Stay (HOSPITAL_COMMUNITY)
Admission: AD | Admit: 2018-06-13 | Discharge: 2018-06-13 | Disposition: A | Discharge status: Home / Self Care | Payer: Medicare Other | Attending: Obstetrics and Gynecology | Admitting: Obstetrics and Gynecology

## 2018-06-13 ENCOUNTER — Inpatient Hospital Stay (HOSPITAL_COMMUNITY): Payer: Medicare Other

## 2018-06-13 DIAGNOSIS — Z91013 Allergy to seafood: Secondary | ICD-10-CM | POA: Diagnosis not present

## 2018-06-13 DIAGNOSIS — Z792 Long term (current) use of antibiotics: Secondary | ICD-10-CM | POA: Diagnosis not present

## 2018-06-13 DIAGNOSIS — Z3A09 9 weeks gestation of pregnancy: Secondary | ICD-10-CM | POA: Diagnosis not present

## 2018-06-13 DIAGNOSIS — Z833 Family history of diabetes mellitus: Secondary | ICD-10-CM | POA: Diagnosis not present

## 2018-06-13 DIAGNOSIS — O209 Hemorrhage in early pregnancy, unspecified: Secondary | ICD-10-CM

## 2018-06-13 DIAGNOSIS — O161 Unspecified maternal hypertension, first trimester: Secondary | ICD-10-CM | POA: Insufficient documentation

## 2018-06-13 DIAGNOSIS — O208 Other hemorrhage in early pregnancy: Secondary | ICD-10-CM | POA: Insufficient documentation

## 2018-06-13 DIAGNOSIS — Z79899 Other long term (current) drug therapy: Secondary | ICD-10-CM | POA: Diagnosis not present

## 2018-06-13 DIAGNOSIS — O99511 Diseases of the respiratory system complicating pregnancy, first trimester: Secondary | ICD-10-CM | POA: Diagnosis not present

## 2018-06-13 DIAGNOSIS — O418X10 Other specified disorders of amniotic fluid and membranes, first trimester, not applicable or unspecified: Secondary | ICD-10-CM | POA: Diagnosis not present

## 2018-06-13 DIAGNOSIS — J45909 Unspecified asthma, uncomplicated: Secondary | ICD-10-CM | POA: Insufficient documentation

## 2018-06-13 DIAGNOSIS — O468X1 Other antepartum hemorrhage, first trimester: Secondary | ICD-10-CM | POA: Diagnosis not present

## 2018-06-13 DIAGNOSIS — Z8249 Family history of ischemic heart disease and other diseases of the circulatory system: Secondary | ICD-10-CM | POA: Insufficient documentation

## 2018-06-13 LAB — URINALYSIS, ROUTINE W REFLEX MICROSCOPIC
Bilirubin Urine: NEGATIVE
Glucose, UA: NEGATIVE mg/dL
Ketones, ur: NEGATIVE mg/dL
Nitrite: NEGATIVE
Protein, ur: NEGATIVE mg/dL
Specific Gravity, Urine: 1.019 (ref 1.005–1.030)
pH: 6 (ref 5.0–8.0)

## 2018-06-13 LAB — WET PREP, GENITAL
Clue Cells Wet Prep HPF POC: NONE SEEN
Sperm: NONE SEEN
Trich, Wet Prep: NONE SEEN
YEAST WET PREP: NONE SEEN

## 2018-06-13 NOTE — Discharge Instructions (Signed)
Safe Medications in Pregnancy  ° °Acne: °Benzoyl Peroxide °Salicylic Acid ° °Backache/Headache: °Tylenol: 2 regular strength every 4 hours OR °             2 Extra strength every 6 hours ° °Colds/Coughs/Allergies: °Benadryl (alcohol free) 25 mg every 6 hours as needed °Breath right strips °Claritin °Cepacol throat lozenges °Chloraseptic throat spray °Cold-Eeze- up to three times per day °Cough drops, alcohol free °Flonase (by prescription only) °Guaifenesin °Mucinex °Robitussin DM (plain only, alcohol free) °Saline nasal spray/drops °Sudafed (pseudoephedrine) & Actifed ** use only after [redacted] weeks gestation and if you do not have high blood pressure °Tylenol °Vicks Vaporub °Zinc lozenges °Zyrtec  ° °Constipation: °Colace °Ducolax suppositories °Fleet enema °Glycerin suppositories °Metamucil °Milk of magnesia °Miralax °Senokot °Smooth move tea ° °Diarrhea: °Kaopectate °Imodium A-D ° °*NO pepto Bismol ° °Hemorrhoids: °Anusol °Anusol HC °Preparation H °Tucks ° °Indigestion: °Tums °Maalox °Mylanta °Zantac  °Pepcid ° °Insomnia: °Benadryl (alcohol free) 25mg every 6 hours as needed °Tylenol PM °Unisom, no Gelcaps ° °Leg Cramps: °Tums °MagGel ° °Nausea/Vomiting:  °Bonine °Dramamine °Emetrol °Ginger extract °Sea bands °Meclizine  °Nausea medication to take during pregnancy:  °Unisom (doxylamine succinate 25 mg tablets) Take one tablet daily at bedtime. If symptoms are not adequately controlled, the dose can be increased to a maximum recommended dose of two tablets daily (1/2 tablet in the morning, 1/2 tablet mid-afternoon and one at bedtime). °Vitamin B6 100mg tablets. Take one tablet twice a day (up to 200 mg per day). ° °Skin Rashes: °Aveeno products °Benadryl cream or 25mg every 6 hours as needed °Calamine Lotion °1% cortisone cream ° °Yeast infection: °Gyne-lotrimin 7 °Monistat 7 ° ° °**If taking multiple medications, please check labels to avoid duplicating the same active ingredients °**take medication as directed on  the label °** Do not exceed 4000 mg of tylenol in 24 hours °**Do not take medications that contain aspirin or ibuprofen ° ° ° °[facility] °         °[facility]  ° Phone: [phone] ° °[facility] @ [facility]  ° Phone: [phone] ° °[facility]      ° Phone: [phone]   °         °[facility] @ [facility]    ° Phone: [phone] °         °[facility] @ [place]  ° Phone: [phone] ° °[facility] @ [facility] ° Phone: [phone] °    °[facility] ([place]) ° Phone: [phone] ° °

## 2018-06-13 NOTE — MAU Provider Note (Signed)
History     CSN: [id number]  Arrival date and time: 06/13/18 [id number]   First Provider Initiated Contact with Patient 06/13/18 [phone]      Chief Complaint  Patient presents with  . Vaginal Bleeding   HPI  Ms.  Anna King is a 37 y.o. year old [id number] female at 6977w5d weeks gestation who presents to MAU reporting dark, red VB with wiping on 06/12/18, pain in sides, small amount of VB with clots with wiping this AM, intermittent, lower abdominal cramping. She reports last SI was Wednesday 06/11/18. [facility], but were unable to hear FHTs.  Past Medical History:  Diagnosis Date  . Asthma   . BV (bacterial vaginosis)   . Chlamydia   . Hypertension     Past Surgical History:  Procedure Laterality Date  . WISDOM TOOTH EXTRACTION      Family History  Problem Relation Age of Onset  . Diabetes Mother   . Hypertension Mother     Social History   Tobacco Use  . Smoking status: Never Smoker  . Smokeless tobacco: Never Used  Substance Use Topics  . Alcohol use: No  . Drug use: No    Allergies:  Allergies  Allergen Reactions  . Shellfish Allergy Anaphylaxis    Medications Prior to Admission  Medication Sig Dispense Refill Last Dose  . cyclobenzaprine (FLEXERIL) 10 MG tablet Take 1 tablet (10 mg total) by mouth 2 (two) times daily as needed for muscle spasms. 20 tablet 0 Past Week at Unknown time  . labetalol (NORMODYNE) 100 MG tablet Take 1 tablet (100 mg total) by mouth 2 (two) times daily. 20 tablet 0   . metroNIDAZOLE (FLAGYL) 500 MG tablet Take 1 tablet (500 mg total) by mouth 2 (two) times daily. 14 tablet 0   . Prenatal Vit-Fe Fumarate-FA (PRENATAL COMPLETE) 14-0.4 MG TABS Take 1 tablet by mouth daily. 60 each 0     Review of Systems  Constitutional: Negative.   HENT: Negative.   Eyes: Negative.   Respiratory: Negative.   Cardiovascular: Negative.   Gastrointestinal: Negative.    Endocrine: Negative.   Genitourinary: Positive for vaginal bleeding.  Musculoskeletal: Negative.   Skin: Negative.   Allergic/Immunologic: Negative.   Neurological: Negative.   Hematological: Negative.   Psychiatric/Behavioral: Negative.    Physical Exam   Blood pressure (!) 122/59, pulse (!) 122, temperature 98.4 F (36.9 C), resp. rate 16, weight (!) 152.9 kg, last menstrual period 03/26/2018.  Physical Exam  Nursing note and vitals reviewed. Constitutional: She is oriented to person, place, and time. She appears well-developed and well-nourished.  HENT:  Head: Normocephalic and atraumatic.  Eyes: Pupils are equal, round, and reactive to light.  Neck: Normal range of motion.  Cardiovascular: Normal rate.  Respiratory: Effort normal and breath sounds normal.  GI: Soft.  Genitourinary:    Genitourinary Comments: Uterus: non-tender, difficult to assess, SE: cervix is smooth, pink, no lesions, scant amt of dark, brownish-red blood in vagina; cx: closed/long/firm, no CMT or friability, no adnexal tenderness    Musculoskeletal: Normal range of motion.  Neurological: She is alert and oriented to person, place, and time.  Skin: Skin is warm and dry.  Psychiatric: She has a normal mood and affect. Her behavior is normal. Judgment and thought content normal.    MAU Course  Procedures  MDM Speculum Exam  OB F/U U/S Wet Prep GC/CT -- pending  Results for orders placed or performed during the hospital encounter of 06/13/18 (from the past 24 hour(s))  Urinalysis, Routine w reflex microscopic     Status: Abnormal   Collection Time: 06/13/18  9:00 AM  Result Value Ref Range   Color, Urine YELLOW YELLOW   APPearance HAZY (A) CLEAR   Specific Gravity, Urine 1.019 1.005 - 1.030   pH 6.0 5.0 - 8.0   Glucose, UA NEGATIVE NEGATIVE mg/dL   Hgb urine dipstick LARGE (A) NEGATIVE   Bilirubin Urine NEGATIVE NEGATIVE   Ketones, ur NEGATIVE NEGATIVE mg/dL   Protein, ur NEGATIVE NEGATIVE  mg/dL   Nitrite NEGATIVE NEGATIVE   Leukocytes, UA TRACE (A) NEGATIVE   RBC / HPF 21-50 0 - 5 RBC/hpf   WBC, UA 0-5 0 - 5 WBC/hpf   Bacteria, UA RARE (A) NONE SEEN   Squamous Epithelial / LPF 6-10 0 - 5   Mucus PRESENT   Wet prep, genital     Status: Abnormal   Collection Time: 06/13/18 10:01 AM  Result Value Ref Range   Yeast Wet Prep HPF POC NONE SEEN NONE SEEN   Trich, Wet Prep NONE SEEN NONE SEEN   Clue Cells Wet Prep HPF POC NONE SEEN NONE SEEN   WBC, Wet Prep HPF POC FEW (A) NONE SEEN   Sperm NONE SEEN     [place] Ob Less Than 14 Weeks With Ob Transvaginal  Result Date: 06/13/2018 CLINICAL DATA:  Bleeding EXAM: OBSTETRIC <14 WK US AND TRANSVAGINAL OB US TECHNIQUE: Both transabdominal and transvaginal ultrasound examinations were performed for complete evaluation of the gestation as well as the maternal uterus, adnexal regions, and pelvic cul-de-sac. Transvaginal technique was performed to assess early pregnancy. COMPARISON:  05/26/2018 FINDINGS: Intrauterine gestational sac: Single Yolk sac:  Visualized Embryo:  Visualized Cardiac Activity: Visualized Heart Rate: 157 bpm MSD:   mm    w     d CRL:  27.9 mm   9 w   4 d                  US EDC: 01/12/2019 Subchorionic hemorrhage:  Possible small subchorionic hemorrhage. Maternal uterus/adnexae: No adnexal mass or free fluid. IMPRESSION: Nine week 4 day intrauterine pregnancy. Fetal heart rate 157 beats per minute. Possible small subchorionic hemorrhage. Electronically Signed   By: Charlett NoseKevin  Dover M.D.   On: 06/13/2018 10:48    Assessment and Plan  Subchorionic hematoma in first trimester, single or unspecified fetus  - [facility] on [facility]   Bleeding in early pregnancy  - Information provided on bleeding in pregnancy  - Discharge patient - Return to MAU   If you have heavier bleeding that soaks through more that 2 pads per hour for an hour or more  If you bleed so much that you feel like you might pass out or you do pass out  If  you have significant abdominal pain that is not improved with Tylenol   If you develop a fever > 100.5   - Patient verbalized an understanding of the plan of care and agrees.   Raelyn Moraolitta Kacelyn Rowzee, MSN, CNM 06/13/2018, 9:44 AM

## 2018-06-13 NOTE — Progress Notes (Signed)
Pt performed self swabs for vaginal cultures.

## 2018-06-13 NOTE — MAU Note (Signed)
Pt presents to MAU with complaints of dark red vaginal bleeding when she wiped yesterday with pain in her sides, reports small amount of vaginal bleeding today with clots. Lower abdominal cramping on and off

## 2018-06-16 LAB — GC/CHLAMYDIA PROBE AMP (~~LOC~~) NOT AT ARMC
Chlamydia: NEGATIVE
Neisseria Gonorrhea: NEGATIVE

## 2018-06-25 NOTE — L&D Delivery Note (Signed)
Pt delivered in the bathtub at home around 6 am.  EMS arrived just as baby was being born,. Live female, apgars of 9/9 per EMS>  Placenta delivered in route.  "Lots of bleeding" per EMS and pt.  Vagina intact.  Measured clots of 493cc evacuated from LUS.  Bladder emptied, cytotec and Pitocin given. Pt stable. Baby take to NICU for evaluation.

## 2018-06-30 ENCOUNTER — Ambulatory Visit (HOSPITAL_COMMUNITY)
Admission: EM | Admit: 2018-06-30 | Discharge: 2018-06-30 | Disposition: A | Discharge status: Home / Self Care | Payer: Medicare Other | Attending: Internal Medicine | Admitting: Internal Medicine

## 2018-06-30 ENCOUNTER — Encounter (HOSPITAL_COMMUNITY): Payer: Self-pay | Admitting: Emergency Medicine

## 2018-06-30 ENCOUNTER — Other Ambulatory Visit: Payer: Self-pay

## 2018-06-30 DIAGNOSIS — S161XXA Strain of muscle, fascia and tendon at neck level, initial encounter: Secondary | ICD-10-CM

## 2018-06-30 DIAGNOSIS — Z331 Pregnant state, incidental: Secondary | ICD-10-CM | POA: Diagnosis not present

## 2018-06-30 DIAGNOSIS — M545 Low back pain, unspecified: Secondary | ICD-10-CM
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
## 2018-06-30 NOTE — Discharge Instructions (Signed)
Please mainly stick to Tylenol for pain [phone] mg every 4-6 hours Alternate Ice and heat Pain and stiffness in neck and back may worsen before it gets better, I would expect gradual improvement over the next 1 to 2 weeks  Please follow-up if pain not improving with time, Tylenol, developing numbness or tingling, loss of control of urination or bowel movements, numbness or tingling  [facility] to [facility] if you develop abdominal pain, leaking of fluid vaginally or bleeding
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
## 2018-07-02 ENCOUNTER — Other Ambulatory Visit (HOSPITAL_COMMUNITY)
Admission: RE | Admit: 2018-07-02 | Discharge: 2018-07-02 | Disposition: A | Discharge status: Home / Self Care | Payer: Medicare Other | Source: Ambulatory Visit | Attending: Obstetrics and Gynecology | Admitting: Obstetrics and Gynecology

## 2018-07-02 ENCOUNTER — Encounter: Payer: Self-pay | Admitting: Obstetrics and Gynecology

## 2018-07-02 ENCOUNTER — Ambulatory Visit (INDEPENDENT_AMBULATORY_CARE_PROVIDER_SITE_OTHER): Payer: Medicare Other | Admitting: Obstetrics and Gynecology

## 2018-07-02 VITALS — BP 119/85 | HR 92 | Wt 331.6 lb

## 2018-07-02 DIAGNOSIS — O0992 Supervision of high risk pregnancy, unspecified, second trimester: Secondary | ICD-10-CM

## 2018-07-02 DIAGNOSIS — O10919 Unspecified pre-existing hypertension complicating pregnancy, unspecified trimester: Secondary | ICD-10-CM | POA: Insufficient documentation

## 2018-07-02 DIAGNOSIS — O09521 Supervision of elderly multigravida, first trimester: Secondary | ICD-10-CM

## 2018-07-02 DIAGNOSIS — O10912 Unspecified pre-existing hypertension complicating pregnancy, second trimester: Secondary | ICD-10-CM

## 2018-07-02 DIAGNOSIS — O09522 Supervision of elderly multigravida, second trimester: Secondary | ICD-10-CM

## 2018-07-02 DIAGNOSIS — O99210 Obesity complicating pregnancy, unspecified trimester: Secondary | ICD-10-CM | POA: Insufficient documentation

## 2018-07-02 DIAGNOSIS — O99212 Obesity complicating pregnancy, second trimester: Secondary | ICD-10-CM

## 2018-07-02 DIAGNOSIS — O09529 Supervision of elderly multigravida, unspecified trimester: Secondary | ICD-10-CM | POA: Insufficient documentation

## 2018-07-02 DIAGNOSIS — O0990 Supervision of high risk pregnancy, unspecified, unspecified trimester: Secondary | ICD-10-CM | POA: Diagnosis present

## 2018-07-02 DIAGNOSIS — O99211 Obesity complicating pregnancy, first trimester: Secondary | ICD-10-CM

## 2018-07-02 LAB — POCT URINALYSIS DIP (DEVICE)
Glucose, UA: NEGATIVE mg/dL
Leukocytes, UA: NEGATIVE
Nitrite: NEGATIVE
PH: 5.5 (ref 5.0–8.0)
Protein, ur: 30 mg/dL — AB
Specific Gravity, Urine: >=1.03 (ref 1.005–1.030)
Urobilinogen, UA: 1 mg/dL (ref 0.0–1.0)

## 2018-07-02 MED ORDER — LABETALOL HCL 200 MG PO TABS: 100.0000 mg | ORAL_TABLET | Freq: Two times a day (BID) | ORAL | 3 refills | Status: DC

## 2018-07-02 MED ORDER — ASPIRIN EC 81 MG PO TBEC: 81.0000 mg | DELAYED_RELEASE_TABLET | Freq: Every day | ORAL | 2 refills | Status: DC

## 2018-07-02 NOTE — Patient Instructions (Signed)
First Trimester of Pregnancy  The first trimester of pregnancy is from week 1 until the end of week 13 (months 1 through 3). A week after a sperm fertilizes an egg, the egg will implant on the wall of the uterus. This embryo will begin to develop into a baby. Genes from you and your partner will form the baby. The female genes will determine whether the baby will be a boy or a girl. At 6-8 weeks, the eyes and face will be formed, and the heartbeat can be seen on ultrasound. At the end of 12 weeks, all the baby's organs will be formed.  Now that you are pregnant, you will want to do everything you can to have a healthy baby. Two of the most important things are to get good prenatal care and to follow your health care provider's instructions. Prenatal care is all the medical care you receive before the baby's birth. This care will help prevent, find, and treat any problems during the pregnancy and childbirth.  Body changes during your first trimester  Your body goes through many changes during pregnancy. The changes vary from woman to woman.   You may gain or lose a couple of pounds at first.   You may feel sick to your stomach (nauseous) and you may throw up (vomit). If the vomiting is uncontrollable, call your health care provider.   You may tire easily.   You may develop headaches that can be relieved by medicines. All medicines should be approved by your health care provider.   You may urinate more often. Painful urination may mean you have a bladder infection.   You may develop heartburn as a result of your pregnancy.   You may develop constipation because certain hormones are causing the muscles that push stool through your intestines to slow down.   You may develop hemorrhoids or swollen veins (varicose veins).   Your breasts may begin to grow larger and become tender. Your nipples may stick out more, and the tissue that surrounds them (areola) may become darker.   Your gums may bleed and may be  sensitive to brushing and flossing.   Dark spots or blotches (chloasma, mask of pregnancy) may develop on your face. This will likely fade after the baby is born.   Your menstrual periods will stop.   You may have a loss of appetite.   You may develop cravings for certain kinds of food.   You may have changes in your emotions from day to day, such as being excited to be pregnant or being concerned that something may go wrong with the pregnancy and baby.   You may have more vivid and strange dreams.   You may have changes in your hair. These can include thickening of your hair, rapid growth, and changes in texture. Some women also have hair loss during or after pregnancy, or hair that feels dry or thin. Your hair will most likely return to normal after your baby is born.  What to expect at prenatal visits  During a routine prenatal visit:   You will be weighed to make sure you and the baby are growing normally.   Your blood pressure will be taken.   Your abdomen will be measured to track your baby's growth.   The fetal heartbeat will be listened to between weeks 10 and 14 of your pregnancy.   Test results from any previous visits will be discussed.  Your health care provider may ask you:     How you are feeling.   If you are feeling the baby move.   If you have had any abnormal symptoms, such as leaking fluid, bleeding, severe headaches, or abdominal cramping.   If you are using any tobacco products, including cigarettes, chewing tobacco, and electronic cigarettes.   If you have any questions.  Other tests that may be performed during your first trimester include:   Blood tests to find your blood type and to check for the presence of any previous infections. The tests will also be used to check for low iron levels (anemia) and protein on red blood cells (Rh antibodies). Depending on your risk factors, or if you previously had diabetes during pregnancy, you may have tests to check for high blood sugar  that affects pregnant women (gestational diabetes).   Urine tests to check for infections, diabetes, or protein in the urine.   An ultrasound to confirm the proper growth and development of the baby.   Fetal screens for spinal cord problems (spina bifida) and Down syndrome.   HIV (human immunodeficiency virus) testing. Routine prenatal testing includes screening for HIV, unless you choose not to have this test.   You may need other tests to make sure you and the baby are doing well.  Follow these instructions at home:  Medicines   Follow your health care provider's instructions regarding medicine use. Specific medicines may be either safe or unsafe to take during pregnancy.   Take a prenatal vitamin that contains at least 600 micrograms (mcg) of folic acid.   If you develop constipation, try taking a stool softener if your health care provider approves.  Eating and drinking     Eat a balanced diet that includes fresh fruits and vegetables, whole grains, good sources of protein such as meat, eggs, or tofu, and low-fat dairy. Your health care provider will help you determine the amount of weight gain that is right for you.   Avoid raw meat and uncooked cheese. These carry germs that can cause birth defects in the baby.   Eating four or five small meals rather than three large meals a day may help relieve nausea and vomiting. If you start to feel nauseous, eating a few soda crackers can be helpful. Drinking liquids between meals, instead of during meals, also seems to help ease nausea and vomiting.   Limit foods that are high in fat and processed sugars, such as fried and sweet foods.   To prevent constipation:  ? Eat foods that are high in fiber, such as fresh fruits and vegetables, whole grains, and beans.  ? Drink enough fluid to keep your urine clear or pale yellow.  Activity   Exercise only as directed by your health care provider. Most women can continue their usual exercise routine during  pregnancy. Try to exercise for 30 minutes at least 5 days a week. Exercising will help you:  ? Control your weight.  ? Stay in shape.  ? Be prepared for labor and delivery.   Experiencing pain or cramping in the lower abdomen or lower back is a good sign that you should stop exercising. Check with your health care provider before continuing with normal exercises.   Try to avoid standing for long periods of time. Move your legs often if you must stand in one place for a long time.   Avoid heavy lifting.   Wear low-heeled shoes and practice good posture.   You may continue to have sex unless your health care   provider tells you not to.  Relieving pain and discomfort   Wear a good support bra to relieve breast tenderness.   Take warm sitz baths to soothe any pain or discomfort caused by hemorrhoids. Use hemorrhoid cream if your health care provider approves.   Rest with your legs elevated if you have leg cramps or low back pain.   If you develop varicose veins in your legs, wear support hose. Elevate your feet for 15 minutes, 3-4 times a day. Limit salt in your diet.  Prenatal care   Schedule your prenatal visits by the twelfth week of pregnancy. They are usually scheduled monthly at first, then more often in the last 2 months before delivery.   Write down your questions. Take them to your prenatal visits.   Keep all your prenatal visits as told by your health care provider. This is important.  Safety   Wear your seat belt at all times when driving.   Make a list of emergency phone numbers, including numbers for family, friends, the hospital, and police and fire departments.  General instructions   Ask your health care provider for a referral to a local prenatal education class. Begin classes no later than the beginning of month 6 of your pregnancy.   Ask for help if you have counseling or nutritional needs during pregnancy. Your health care provider can offer advice or refer you to specialists for help  with various needs.   Do not use hot tubs, steam rooms, or saunas.   Do not douche or use tampons or scented sanitary pads.   Do not cross your legs for long periods of time.   Avoid cat litter boxes and soil used by cats. These carry germs that can cause birth defects in the baby and possibly loss of the fetus by miscarriage or stillbirth.   Avoid all smoking, herbs, alcohol, and medicines not prescribed by your health care provider. Chemicals in these products affect the formation and growth of the baby.   Do not use any products that contain nicotine or tobacco, such as cigarettes and e-cigarettes. If you need help quitting, ask your health care provider. You may receive counseling support and other resources to help you quit.   Schedule a dentist appointment. At home, brush your teeth with a soft toothbrush and be gentle when you floss.  Contact a health care provider if:   You have dizziness.   You have mild pelvic cramps, pelvic pressure, or nagging pain in the abdominal area.   You have persistent nausea, vomiting, or diarrhea.   You have a bad smelling vaginal discharge.   You have pain when you urinate.   You notice increased swelling in your face, hands, legs, or ankles.   You are exposed to fifth disease or chickenpox.   You are exposed to German measles (rubella) and have never had it.  Get help right away if:   You have a fever.   You are leaking fluid from your vagina.   You have spotting or bleeding from your vagina.   You have severe abdominal cramping or pain.   You have rapid weight gain or loss.   You vomit blood or material that looks like coffee grounds.   You develop a severe headache.   You have shortness of breath.   You have any kind of trauma, such as from a fall or a car accident.  Summary   The first trimester of pregnancy is from week 1 until   the end of week 13 (months 1 through 3).   Your body goes through many changes during pregnancy. The changes vary from  woman to woman.   You will have routine prenatal visits. During those visits, your health care provider will examine you, discuss any test results you may have, and talk with you about how you are feeling.  This information is not intended to replace advice given to you by your health care provider. Make sure you discuss any questions you have with your health care provider.  Document Released: 06/05/2001 Document Revised: 05/23/2016 Document Reviewed: 05/23/2016  Elsevier Interactive Patient Education  2019 [employer]

## 2018-07-02 NOTE — Addendum Note (Signed)
Addended by: Henrietta Dine on: 07/02/2018 02:31 PM   Modules accepted: Orders

## 2018-07-02 NOTE — Progress Notes (Signed)
Subjective:  Anna King is a 38 y.o. G3P2002 at [redacted]w[redacted]d being seen today for her first OB visit. EDD by first trimester U/S. H/O CHTN, unknown medication. H/O [facility] but no bleeding presently. H/O TSVD x 2 without problems.  She is currently monitored for the following issues for this high-risk pregnancy and has Essential hypertension; Subchorionic hematoma in first trimester; Supervision of high risk pregnancy, antepartum; Obesity affecting pregnancy; Advanced maternal age in multigravida; and Chronic hypertension in pregnancy on their problem list.  Patient reports no complaints.  Contractions: Not present. Vag. Bleeding: Scant.  Movement: Absent. Denies leaking of fluid.   The following portions of the patient's history were reviewed and updated as appropriate: allergies, current medications, past family history, past medical history, past social history, past surgical history and problem list. Problem list updated.  Objective:   Vitals:   07/02/18 1018  BP: 119/85  Pulse: 92  Weight: (!) 331 lb 9.6 oz (150.4 kg)    Fetal Status:     Movement: Absent     General:  Alert, oriented and cooperative. Patient is in no acute distress.  Skin: Skin is warm and dry. No rash noted.   Cardiovascular: Normal heart rate noted  Respiratory: Normal respiratory effort, no problems with respiration noted  Abdomen: Soft, gravid, appropriate for gestational age. Pain/Pressure: Present     Pelvic:  Cervical exam performed        Extremities: Normal range of motion.     Mental Status: Normal mood and affect. Normal behavior. Normal judgment and thought content.   Urinalysis:      Assessment and Plan:  Pregnancy: G3P2002 at [redacted]w[redacted]d  1. Supervision of high risk pregnancy, antepartum Prenatal care and labs reviewed with pt - Inheritest(R) CF/SMA Panel - [facility] AMB BABYSCRIPTS OPT IN - Comprehensive metabolic panel - Culture, OB Urine - Cystic Fibrosis Mutation 97 - Genetic Screening - Hemoglobinopathy  Evaluation - Obstetric Panel, Including HIV - Protein / creatinine ratio, urine - SMN1 COPY NUMBER ANALYSIS (SMA Carrier Screen) - [place] MFM OB DETAIL +14 WK; Future - POCT urinalysis dip (device)  2. Obesity affecting pregnancy in first trimester Wt management, diet and wt gain in pregnancy reviewed with pt - TSH - Hemoglobin A1c  3. Chronic hypertension in pregnancy CHTN and pregnancy reviewed with pt.  Indications for BASA reviewed Antenatal testing and growth scans discussed - TSH - labetalol (NORMODYNE) 200 MG tablet; Take 0.5 tablets (100 mg total) by mouth 2 (two) times daily.  Dispense: 60 tablet; Refill: 3 - aspirin EC 81 MG tablet; Take 1 tablet (81 mg total) by mouth daily. Take after 12 weeks for prevention of preeclampsia later in pregnancy  Dispense: 300 tablet; Refill: 2  4. Multigravida of advanced maternal age in first trimester Genetic testing reviewed and recommended d/t age. Panorama ordered today  Preterm labor symptoms and general obstetric precautions including but not limited to vaginal bleeding, contractions, leaking of fluid and fetal movement were reviewed in detail with the patient. Please refer to After Visit Summary for other counseling recommendations.  Return in about 4 weeks (around 07/30/2018) for OB visit.   Anna Staggers, MD

## 2018-07-03 LAB — CYTOLOGY - PAP
Chlamydia: NEGATIVE
Diagnosis: NEGATIVE
HPV: NOT DETECTED
Neisseria Gonorrhea: NEGATIVE

## 2018-07-04 LAB — CULTURE, OB URINE

## 2018-07-04 LAB — URINE CULTURE, OB REFLEX

## 2018-07-07 ENCOUNTER — Encounter: Payer: Self-pay | Admitting: *Deleted

## 2018-07-11 LAB — COMPREHENSIVE METABOLIC PANEL
ALBUMIN: 3.3 g/dL — AB (ref 3.5–5.5)
ALT: 8 IU/L (ref 0–32)
AST: 11 IU/L (ref 0–40)
Albumin/Globulin Ratio: 1 — ABNORMAL LOW (ref 1.2–2.2)
Alkaline Phosphatase: 64 IU/L (ref 39–117)
BUN / CREAT RATIO: 13 (ref 9–23)
BUN: 8 mg/dL (ref 6–20)
Bilirubin Total: 0.3 mg/dL (ref 0.0–1.2)
CO2: 21 mmol/L (ref 20–29)
Calcium: 9.3 mg/dL (ref 8.7–10.2)
Chloride: 105 mmol/L (ref 96–106)
Creatinine, Ser: 0.63 mg/dL (ref 0.57–1.00)
GFR calc Af Amer: 134 mL/min/{1.73_m2} (ref >59)
GFR calc non Af Amer: 117 mL/min/{1.73_m2} (ref >59)
Globulin, Total: 3.4 g/dL (ref 1.5–4.5)
Glucose: 78 mg/dL (ref 65–99)
Potassium: 4 mmol/L (ref 3.5–5.2)
SODIUM: 139 mmol/L (ref 134–144)
Total Protein: 6.7 g/dL (ref 6.0–8.5)

## 2018-07-11 LAB — INHERITEST(R) CF/SMA PANEL

## 2018-07-11 LAB — OBSTETRIC PANEL, INCLUDING HIV
Antibody Screen: NEGATIVE
Basophils Absolute: 0 10*3/uL (ref 0.0–0.2)
Basos: 1 %
EOS (ABSOLUTE): 0.1 10*3/uL (ref 0.0–0.4)
Eos: 1 %
HIV Screen 4th Generation wRfx: NONREACTIVE
Hematocrit: 36.7 % (ref 34.0–46.6)
Hemoglobin: 12.1 g/dL (ref 11.1–15.9)
Hepatitis B Surface Ag: NEGATIVE
IMMATURE GRANULOCYTES: 0 %
Immature Grans (Abs): 0 10*3/uL (ref 0.0–0.1)
Lymphocytes Absolute: 1.7 10*3/uL (ref 0.7–3.1)
Lymphs: 27 %
MCH: 26.3 pg — ABNORMAL LOW (ref 26.6–33.0)
MCHC: 33 g/dL (ref 31.5–35.7)
MCV: 80 fL (ref 79–97)
Monocytes Absolute: 0.7 10*3/uL (ref 0.1–0.9)
Monocytes: 12 %
Neutrophils Absolute: 3.6 10*3/uL (ref 1.4–7.0)
Neutrophils: 59 %
Platelets: 254 10*3/uL (ref 150–450)
RBC: 4.6 x10E6/uL (ref 3.77–5.28)
RDW: 15.8 % — AB (ref 11.7–15.4)
RPR Ser Ql: NONREACTIVE
Rh Factor: POSITIVE
Rubella Antibodies, IGG: 1.74 index (ref >0.99)
WBC: 6.1 10*3/uL (ref 3.4–10.8)

## 2018-07-11 LAB — HEMOGLOBINOPATHY EVALUATION
Ferritin: 32 ng/mL (ref 15–150)
Hgb A2 Quant: 2.1 % (ref 1.8–3.2)
Hgb A: 97.9 % (ref 96.4–98.8)
Hgb C: 0 %
Hgb F Quant: 0 % (ref 0.0–2.0)
Hgb S: 0 %
Hgb Solubility: NEGATIVE
Hgb Variant: 0 %

## 2018-07-11 LAB — HEMOGLOBIN A1C
Est. average glucose Bld gHb Est-mCnc: 105 mg/dL
Hgb A1c MFr Bld: 5.3 % (ref 4.8–5.6)

## 2018-07-11 LAB — PROTEIN / CREATININE RATIO, URINE
Creatinine, Urine: 364.3 mg/dL
PROTEIN/CREAT RATIO: 62 mg/g{creat} (ref 0–200)
Protein, Ur: 22.6 mg/dL

## 2018-07-11 LAB — TSH: TSH: 1.47 u[IU]/mL (ref 0.450–4.500)

## 2018-07-15 ENCOUNTER — Encounter: Payer: Self-pay | Admitting: *Deleted

## 2018-07-31 ENCOUNTER — Ambulatory Visit (INDEPENDENT_AMBULATORY_CARE_PROVIDER_SITE_OTHER): Payer: Medicare Other | Admitting: Obstetrics and Gynecology

## 2018-07-31 ENCOUNTER — Encounter: Payer: Self-pay | Admitting: Obstetrics and Gynecology

## 2018-07-31 VITALS — BP 123/89 | HR 85 | Wt 335.7 lb

## 2018-07-31 DIAGNOSIS — O10919 Unspecified pre-existing hypertension complicating pregnancy, unspecified trimester: Secondary | ICD-10-CM

## 2018-07-31 DIAGNOSIS — O09522 Supervision of elderly multigravida, second trimester: Secondary | ICD-10-CM

## 2018-07-31 DIAGNOSIS — O10912 Unspecified pre-existing hypertension complicating pregnancy, second trimester: Secondary | ICD-10-CM

## 2018-07-31 DIAGNOSIS — O0992 Supervision of high risk pregnancy, unspecified, second trimester: Secondary | ICD-10-CM

## 2018-07-31 DIAGNOSIS — O0990 Supervision of high risk pregnancy, unspecified, unspecified trimester: Secondary | ICD-10-CM

## 2018-07-31 NOTE — Patient Instructions (Signed)

## 2018-07-31 NOTE — Progress Notes (Signed)
Subjective:  Anna King is a 38 y.o. H6O3729 at [redacted]w[redacted]d being seen today for ongoing prenatal care.  She is currently monitored for the following issues for this high-risk pregnancy and has Essential hypertension; Subchorionic hematoma in first trimester; Supervision of high risk pregnancy, antepartum; Obesity affecting pregnancy; Advanced maternal age in multigravida; and Chronic hypertension in pregnancy on their problem list.  Patient reports no complaints.  Contractions: Not present. Vag. Bleeding: None.  Movement: Absent. Denies leaking of fluid.   The following portions of the patient's history were reviewed and updated as appropriate: allergies, current medications, past family history, past medical history, past social history, past surgical history and problem list. Problem list updated.  Objective:   Vitals:   07/31/18 0957  BP: 123/89  Pulse: 85  Weight: (!) 335 lb 11.2 oz (152.3 kg)    Fetal Status: Fetal Heart Rate (bpm): 145   Movement: Absent     General:  Alert, oriented and cooperative. Patient is in no acute distress.  Skin: Skin is warm and dry. No rash noted.   Cardiovascular: Normal heart rate noted  Respiratory: Normal respiratory effort, no problems with respiration noted  Abdomen: Soft, gravid, appropriate for gestational age. Pain/Pressure: Present     Pelvic:  Cervical exam deferred        Extremities: Normal range of motion.  Edema: None  Mental Status: Normal mood and affect. Normal behavior. Normal judgment and thought content.   Urinalysis:      Assessment and Plan:  Pregnancy: G4P2002 at [redacted]w[redacted]d  1. Supervision of high risk pregnancy, antepartum Stable Anatomy scan later this month  2. Chronic hypertension in pregnancy BP stable on Labetalol No S/Sx of PEC Continue plus BASA qd  3. Multigravida of advanced maternal age in second trimester NIPs LR  Preterm labor symptoms and general obstetric precautions including but not limited to vaginal  bleeding, contractions, leaking of fluid and fetal movement were reviewed in detail with the patient. Please refer to After Visit Summary for other counseling recommendations.  Return in about 4 weeks (around 08/28/2018) for OB visit.   Hermina Staggers, MD

## 2018-08-11 ENCOUNTER — Encounter (HOSPITAL_COMMUNITY): Payer: Self-pay

## 2018-08-15 ENCOUNTER — Encounter (HOSPITAL_COMMUNITY): Payer: Self-pay

## 2018-08-19 ENCOUNTER — Other Ambulatory Visit (HOSPITAL_COMMUNITY): Payer: Self-pay | Admitting: *Deleted

## 2018-08-19 ENCOUNTER — Encounter (HOSPITAL_COMMUNITY): Payer: Self-pay

## 2018-08-19 ENCOUNTER — Ambulatory Visit (HOSPITAL_COMMUNITY)
Admission: RE | Admit: 2018-08-19 | Discharge: 2018-08-19 | Disposition: A | Discharge status: Home / Self Care | Payer: Medicare Other | Source: Ambulatory Visit | Attending: Obstetrics and Gynecology | Admitting: Obstetrics and Gynecology

## 2018-08-19 DIAGNOSIS — Z363 Encounter for antenatal screening for malformations: Secondary | ICD-10-CM

## 2018-08-19 DIAGNOSIS — O09522 Supervision of elderly multigravida, second trimester: Secondary | ICD-10-CM | POA: Diagnosis not present

## 2018-08-19 DIAGNOSIS — O3412 Maternal care for benign tumor of corpus uteri, second trimester: Secondary | ICD-10-CM

## 2018-08-19 DIAGNOSIS — Z3A19 19 weeks gestation of pregnancy: Secondary | ICD-10-CM

## 2018-08-19 DIAGNOSIS — O10919 Unspecified pre-existing hypertension complicating pregnancy, unspecified trimester: Secondary | ICD-10-CM

## 2018-08-19 DIAGNOSIS — O99212 Obesity complicating pregnancy, second trimester: Secondary | ICD-10-CM

## 2018-08-19 DIAGNOSIS — O0990 Supervision of high risk pregnancy, unspecified, unspecified trimester: Secondary | ICD-10-CM | POA: Diagnosis not present

## 2018-08-25 ENCOUNTER — Encounter (HOSPITAL_COMMUNITY): Payer: Self-pay | Admitting: *Deleted

## 2018-08-25 ENCOUNTER — Inpatient Hospital Stay (HOSPITAL_COMMUNITY)
Admission: AD | Admit: 2018-08-25 | Discharge: 2018-08-25 | Disposition: A | Discharge status: Home / Self Care | Payer: Medicare Other | Attending: Obstetrics and Gynecology | Admitting: Obstetrics and Gynecology

## 2018-08-25 ENCOUNTER — Inpatient Hospital Stay (HOSPITAL_BASED_OUTPATIENT_CLINIC_OR_DEPARTMENT_OTHER): Payer: Medicare Other

## 2018-08-25 DIAGNOSIS — O3412 Maternal care for benign tumor of corpus uteri, second trimester: Secondary | ICD-10-CM

## 2018-08-25 DIAGNOSIS — O209 Hemorrhage in early pregnancy, unspecified: Secondary | ICD-10-CM

## 2018-08-25 DIAGNOSIS — R1030 Lower abdominal pain, unspecified: Secondary | ICD-10-CM | POA: Diagnosis not present

## 2018-08-25 DIAGNOSIS — O4692 Antepartum hemorrhage, unspecified, second trimester: Secondary | ICD-10-CM

## 2018-08-25 DIAGNOSIS — O09522 Supervision of elderly multigravida, second trimester: Secondary | ICD-10-CM | POA: Diagnosis not present

## 2018-08-25 DIAGNOSIS — O99212 Obesity complicating pregnancy, second trimester: Secondary | ICD-10-CM

## 2018-08-25 DIAGNOSIS — Z3A20 20 weeks gestation of pregnancy: Secondary | ICD-10-CM

## 2018-08-25 NOTE — Discharge Instructions (Signed)
Vaginal Bleeding During Pregnancy, Second Trimester  A small amount of bleeding (spotting) from the vagina is relatively common during pregnancy. It usually stops on its own. Various things can cause spotting during pregnancy. Sometimes the bleeding is normal and is not a sign of a problem in the pregnancy. However, bleeding can also be a sign of something serious. Be sure to tell your health care provider about any vaginal bleeding right away. Some possible causes of vaginal bleeding during the second trimester include:  Infection, inflammation, or growths (polyps) on the cervix.  A condition in which the placenta partially or completely covers the opening of the cervix inside the uterus (placenta previa).  The placenta separating from the uterus (placenta abruption).  Early (preterm) labor.  The cervix opening and thinning before pregnancy is at term and before labor starts (cervical insufficiency).  A mass of tissue developing in the uterus due to an egg being fertilized incorrectly (molar pregnancy). Follow these instructions at home: Activity  Follow instructions from your health care provider about limiting your activity. Ask what activities are safe for you.  If needed, make plans for someone to help with your regular activities.  Do not exercise or do activities that take a lot of effort unless your health care provider approves.  Do not lift anything that is heavier than 10 lb (4.5 kg), or the limit that your health care provider tells you, until he or she says that it is safe.  Do not have sex or orgasms until your health care provider says that this is safe. Medicines  Take over-the-counter and prescription medicines only as told by your health care provider.  Do not take aspirin because it can cause bleeding. General instructions  Pay attention to any changes in your symptoms.  Write down how many pads you use each day, how often you change pads, and how soaked  (saturated) they are.  Do not use tampons or douche.  If you pass any tissue from your vagina, save the tissue so you can show it to your health care provider.  Keep all follow-up visits as told by your health care provider. This is important. Contact a health care provider if:  You have vaginal bleeding during any time of your pregnancy.  You have cramps or labor pains.  You have a fever that does not get better when you take medicines. Get help right away if:  You have severe cramps in your back or abdomen.  You have contractions.  You have chills.  You pass large clots or a large amount of tissue from your vagina.  Your bleeding increases.  You feel light-headed or weak, or you faint.  You are leaking fluid or have a gush of fluid from your vagina. Summary  Various things can cause bleeding or spotting in pregnancy.  Be sure to tell your health care provider about any vaginal bleeding right away.  Follow instructions from your health care provider about limiting your activity. Ask what activities are safe for you. This information is not intended to replace advice given to you by your health care provider. Make sure you discuss any questions you have with your health care provider. Document Released: 03/21/2005 Document Revised: 09/13/2016 Document Reviewed: 09/13/2016 Elsevier Interactive Patient Education  2019 [employer]  Activity Restriction During Pregnancy Your health care provider may recommend specific activity restrictions during pregnancy for a variety of reasons. Activity restriction may require that you limit activities that require great effort, such as exercise,  lifting, or sex. The type of activity restriction will vary for each person, depending on your risk or the problems you are having. Activity restriction may be recommended for a period of time until your baby is delivered. Why are activity restrictions recommended? Activity restriction may be  recommended if:  Your placenta is partially or completely covering the opening of your cervix (placenta previa).  There is bleeding between the wall of the uterus and the amniotic sac in the first trimester of pregnancy (subchorionic hemorrhage).  You went into labor too early (preterm labor).  You have a history of miscarriage.  You have a condition that causes high blood pressure during pregnancy (preeclampsia or eclampsia).  You are pregnant with more than one baby.  Your baby is not growing well. What are the risks? The risks depend on your specific restriction. Strict bed rest has the most physical and emotional risks and is no longer routinely recommended. Risks of strict bed rest include:  Loss of muscle conditioning from not moving.  Blood clots.  Social isolation.  Depression.  Loss of income. Talk with your health care team about activity restriction to decide if it is best for you and your baby. Even if you are having problems during your pregnancy, you may be able to continue with normal levels of activity with careful monitoring by your health care team. Follow these instructions at home: If needed, based on your overall health and the health of your baby, your health care provider will decide which type of activity restriction is right for you. Activity restrictions may include:  Not lifting anything heavier than 10 pounds (4.5 kg).  Avoiding activities that take a lot of physical effort.  No lifting or straining.  Resting in a sitting position or lying down for periods of time during the day. Pelvic rest may be recommended along with activity restrictions. If pelvic rest is recommended, then:  Do not have sex, an orgasm, or use sexual stimulation.  Do not use tampons. Do not douche. Do not put anything into your vagina.  Do not lift anything that is heavier than 10 lb (4.5 kg).  Avoid activities that require a lot of effort.  Avoid any activity in which  your pelvic muscles could become strained, such as squatting. Questions to ask your health care provider  Why is my activity being limited?  How will activity restrictions affect my body?  Why is rest helpful for me and my baby?  What activities can I do?  When can I return to normal activities? When should I seek immediate medical care? Seek immediate medical care if you have:  Vaginal bleeding.  Vaginal discharge.  Cramping pain in your lower abdomen.  Regular contractions.  A low, dull backache. Summary  Your health care provider may recommend specific activity restrictions during pregnancy for a variety of reasons.  Activity restriction may require that you limit activities such as exercise, lifting, sex, or any other activity that requires great effort.  Discuss the risks and benefits of activity restriction with your health care team to decide if it is best for you and your baby.  Contact your health care provider right away if you think you are having contractions, or if you notice vaginal bleeding, discharge, or cramping. This information is not intended to replace advice given to you by your health care provider. Make sure you discuss any questions you have with your health care provider. Document Released: 10/06/2010 Document Revised: 10/01/2017 Document Reviewed: 10/01/2017  Chartered certified accountant Patient Education  [employer].

## 2018-08-25 NOTE — MAU Provider Note (Signed)
History     CSN: [id number]  Arrival date and time: 08/25/18 1746   First Provider Initiated Contact with Patient 08/25/18 1906      Chief Complaint  Patient presents with  . Vaginal Bleeding  . Abdominal Pain   HPI  Anna King is a 39 y.o. G3P2002 at [redacted]w[redacted]d who presents to MAU today with complaint of vaginal bleeding since earlier today. She states mild lower abdominal cramping. She has noted a lot of blood in the toilet with small clots noted with wiping. She has not worn a pad. She denies complications with the pregnancy. She had a normal [place] on 08/19/18.    OB History    Gravida  3   Para  2   Term  2   Preterm      AB      Living  2     SAB      TAB      Ectopic      Multiple      Live Births  2           Past Medical History:  Diagnosis Date  . Asthma   . BV (bacterial vaginosis)   . Chlamydia   . Hypertension     Past Surgical History:  Procedure Laterality Date  . NO PAST SURGERIES    . WISDOM TOOTH EXTRACTION      Family History  Problem Relation Age of Onset  . Diabetes Mother   . Hypertension Mother     Social History   Tobacco Use  . Smoking status: Never Smoker  . Smokeless tobacco: Never Used  Substance Use Topics  . Alcohol use: No  . Drug use: No    Allergies:  Allergies  Allergen Reactions  . Shellfish Allergy Anaphylaxis    Medications Prior to Admission  Medication Sig Dispense Refill Last Dose  . aspirin EC 81 MG tablet Take 1 tablet (81 mg total) by mouth daily. Take after 12 weeks for prevention of preeclampsia later in pregnancy 300 tablet 2 Taking  . labetalol (NORMODYNE) 200 MG tablet Take 0.5 tablets (100 mg total) by mouth 2 (two) times daily. 60 tablet 3 Taking  . Prenatal Vit-Fe Fumarate-FA (PRENATAL COMPLETE) 14-0.4 MG TABS Take 1 tablet by mouth daily. 60 each 0 Taking    Review of Systems  Constitutional: Negative for fever.  Gastrointestinal: Positive for abdominal pain. Negative for  constipation, diarrhea, nausea and vomiting.  Genitourinary: Positive for vaginal bleeding. Negative for vaginal discharge.   Physical Exam   Blood pressure (!) 142/79, pulse 73, temperature 98.3 F (36.8 C), resp. rate 18, height 5\' 7"  (1.702 m), weight (!) 155.6 kg, last menstrual period 04/06/2018, SpO2 100 %, unknown if currently breastfeeding.  Physical Exam  Nursing note and vitals reviewed. Constitutional: She is oriented to person, place, and time. She appears well-developed and well-nourished. No distress.  HENT:  Head: Normocephalic and atraumatic.  Cardiovascular: Normal rate.  Respiratory: Effort normal.  GI: Soft. She exhibits no distension and no mass. There is no abdominal tenderness. There is no rebound and no guarding.  Genitourinary: Uterus is enlarged.    Vaginal bleeding (small with small clots) present.     No vaginal discharge.  There is bleeding (small with small clots) in the vagina.  Neurological: She is alert and oriented to person, place, and time.  Skin: Skin is warm and dry. No erythema.  Psychiatric: She has a normal mood and affect.  Cervix:  closed, thick    MAU Course  Procedures  MDM FHR - 151 bpm Limited OB US ordered due to bleeding  US shows cervical length 3.6 cm and normal placenta without evidence of previa or abruption.   Assessment and Plan  A: SIUP at [redacted]w[redacted]d Vaginal bleeding in pregnancy, second trimester   P: Discharge home Bleeding precautions and pelvic rest discussed Patient advised to follow-up with [facility] as scheduled for routine prenatal care  Patient may return to MAU as needed or if her condition were to change or worsen  Vonzella Nipple, PA-C 08/25/2018, 7:07 PM

## 2018-08-25 NOTE — MAU Note (Signed)
Pt reports vaginal bleeding like a period since 1600, some lower abd cramping.

## 2018-08-28 ENCOUNTER — Ambulatory Visit (INDEPENDENT_AMBULATORY_CARE_PROVIDER_SITE_OTHER): Payer: Medicare Other | Admitting: Obstetrics & Gynecology

## 2018-08-28 VITALS — BP 127/87 | HR 70 | Wt 344.8 lb

## 2018-08-28 DIAGNOSIS — O10919 Unspecified pre-existing hypertension complicating pregnancy, unspecified trimester: Secondary | ICD-10-CM

## 2018-08-28 DIAGNOSIS — O99212 Obesity complicating pregnancy, second trimester: Secondary | ICD-10-CM

## 2018-08-28 DIAGNOSIS — O10912 Unspecified pre-existing hypertension complicating pregnancy, second trimester: Secondary | ICD-10-CM

## 2018-08-28 DIAGNOSIS — Z3A20 20 weeks gestation of pregnancy: Secondary | ICD-10-CM

## 2018-08-28 DIAGNOSIS — O0990 Supervision of high risk pregnancy, unspecified, unspecified trimester: Secondary | ICD-10-CM

## 2018-08-28 LAB — POCT URINALYSIS DIP (DEVICE)
Glucose, UA: NEGATIVE mg/dL
Hgb urine dipstick: NEGATIVE
Leukocytes,Ua: NEGATIVE
Nitrite: NEGATIVE
Protein, ur: 30 mg/dL — AB
Specific Gravity, Urine: 1.025 (ref 1.005–1.030)
Urobilinogen, UA: 1 mg/dL (ref 0.0–1.0)
pH: 6 (ref 5.0–8.0)

## 2018-08-28 NOTE — Progress Notes (Signed)
   PRENATAL VISIT NOTE  Subjective:  Anna King is a 38 y.o. G3P2002 at [redacted]w[redacted]d being seen today for ongoing prenatal care.  She is currently monitored for the following issues for this high-risk pregnancy and has Essential hypertension; Subchorionic hematoma in first trimester; Supervision of high risk pregnancy, antepartum; Obesity affecting pregnancy; Advanced maternal age in multigravida; and Chronic hypertension in pregnancy on their problem list.  Patient reports no complaints.  Contractions: Not present. Vag. Bleeding: None.  Movement: Present. Denies leaking of fluid.   The following portions of the patient's history were reviewed and updated as appropriate: allergies, current medications, past family history, past medical history, past social history, past surgical history and problem list. Problem list updated.  Objective:   Vitals:   08/28/18 1133  BP: 127/87  Pulse: 70  Weight: (!) 344 lb 12.8 oz (156.4 kg)    Fetal Status: Fetal Heart Rate (bpm): 152   Movement: Present     General:  Alert, oriented and cooperative. Patient is in no acute distress.  Skin: Skin is warm and dry. No rash noted.   Cardiovascular: Normal heart rate noted  Respiratory: Normal respiratory effort, no problems with respiration noted  Abdomen: Soft, gravid, appropriate for gestational age.  Pain/Pressure: Absent     Pelvic: Cervical exam deferred        Extremities: Normal range of motion.  Edema: None  Mental Status: Normal mood and affect. Normal behavior. Normal judgment and thought content.   Assessment and Plan:  Pregnancy: G3P2002 at [redacted]w[redacted]d  1. Supervision of high risk pregnancy, antepartum US=dates  2. Obesity affecting pregnancy in second trimester   3. Chronic hypertension in pregnancy Good BP control  Preterm labor symptoms and general obstetric precautions including but not limited to vaginal bleeding, contractions, leaking of fluid and fetal movement were reviewed in detail  with the patient. Please refer to After Visit Summary for other counseling recommendations.  Return in about 4 weeks (around 09/25/2018).  Future Appointments  Date Time Provider Department Center  09/16/2018  9:30 AM [facility] [place] 5 [facility]    Scheryl Darter, MD

## 2018-08-28 NOTE — Patient Instructions (Signed)

## 2018-09-16 ENCOUNTER — Other Ambulatory Visit: Payer: Self-pay

## 2018-09-16 ENCOUNTER — Other Ambulatory Visit (HOSPITAL_COMMUNITY): Payer: Self-pay | Admitting: *Deleted

## 2018-09-16 ENCOUNTER — Encounter (HOSPITAL_COMMUNITY): Payer: Self-pay

## 2018-09-16 ENCOUNTER — Ambulatory Visit (HOSPITAL_COMMUNITY)
Admission: RE | Admit: 2018-09-16 | Discharge: 2018-09-16 | Disposition: A | Discharge status: Home / Self Care | Payer: Medicare Other | Source: Ambulatory Visit | Attending: Obstetrics and Gynecology | Admitting: Obstetrics and Gynecology

## 2018-09-16 ENCOUNTER — Ambulatory Visit (HOSPITAL_COMMUNITY): Payer: Medicare Other | Admitting: *Deleted

## 2018-09-16 VITALS — BP 115/54 | HR 77 | Temp 98.3°F

## 2018-09-16 DIAGNOSIS — O3412 Maternal care for benign tumor of corpus uteri, second trimester: Secondary | ICD-10-CM

## 2018-09-16 DIAGNOSIS — O09522 Supervision of elderly multigravida, second trimester: Secondary | ICD-10-CM

## 2018-09-16 DIAGNOSIS — O10919 Unspecified pre-existing hypertension complicating pregnancy, unspecified trimester: Secondary | ICD-10-CM

## 2018-09-16 DIAGNOSIS — Z3A23 23 weeks gestation of pregnancy: Secondary | ICD-10-CM

## 2018-09-16 DIAGNOSIS — O99212 Obesity complicating pregnancy, second trimester: Secondary | ICD-10-CM | POA: Diagnosis not present

## 2018-09-16 DIAGNOSIS — O09523 Supervision of elderly multigravida, third trimester: Secondary | ICD-10-CM

## 2018-09-16 DIAGNOSIS — Z362 Encounter for other antenatal screening follow-up: Secondary | ICD-10-CM | POA: Diagnosis not present

## 2018-09-16 DIAGNOSIS — O10913 Unspecified pre-existing hypertension complicating pregnancy, third trimester: Secondary | ICD-10-CM

## 2018-09-24 ENCOUNTER — Telehealth: Payer: Self-pay | Admitting: Obstetrics and Gynecology

## 2018-09-24 NOTE — Telephone Encounter (Signed)
postpone--needs cuff ASAP--HOB // Return in about 4 weeks (around 09/25/2018). // Per Dr. Shawnie Pons.  Rescheduled the patient's appointment for 4 weeks. Also called the patient, left a voicemail stating to call our clinic. Sending a reminder letter with the new appointment information.  --- If the patient call back please ask if she has blood pressure cuffs.

## 2018-09-25 ENCOUNTER — Encounter: Payer: Self-pay | Admitting: Family Medicine

## 2018-10-21 ENCOUNTER — Encounter (HOSPITAL_COMMUNITY): Payer: Self-pay

## 2018-10-21 ENCOUNTER — Ambulatory Visit (HOSPITAL_COMMUNITY)
Admission: RE | Admit: 2018-10-21 | Discharge: 2018-10-21 | Disposition: A | Discharge status: Home / Self Care | Payer: Medicare Other | Source: Ambulatory Visit | Attending: Obstetrics and Gynecology | Admitting: Obstetrics and Gynecology

## 2018-10-21 ENCOUNTER — Ambulatory Visit (HOSPITAL_COMMUNITY): Payer: Medicare Other | Admitting: *Deleted

## 2018-10-21 ENCOUNTER — Other Ambulatory Visit: Payer: Self-pay

## 2018-10-21 ENCOUNTER — Other Ambulatory Visit (HOSPITAL_COMMUNITY): Payer: Self-pay | Admitting: *Deleted

## 2018-10-21 VITALS — BP 123/66 | HR 89 | Temp 98.8°F

## 2018-10-21 DIAGNOSIS — O10913 Unspecified pre-existing hypertension complicating pregnancy, third trimester: Secondary | ICD-10-CM

## 2018-10-21 DIAGNOSIS — Z362 Encounter for other antenatal screening follow-up: Secondary | ICD-10-CM

## 2018-10-21 DIAGNOSIS — O10919 Unspecified pre-existing hypertension complicating pregnancy, unspecified trimester: Secondary | ICD-10-CM | POA: Insufficient documentation

## 2018-10-22 ENCOUNTER — Encounter: Payer: Self-pay | Admitting: Obstetrics and Gynecology

## 2018-10-22 ENCOUNTER — Ambulatory Visit (INDEPENDENT_AMBULATORY_CARE_PROVIDER_SITE_OTHER): Payer: Medicare Other | Admitting: Obstetrics and Gynecology

## 2018-10-22 VITALS — BP 116/71 | HR 85 | Wt 348.0 lb

## 2018-10-22 DIAGNOSIS — O10919 Unspecified pre-existing hypertension complicating pregnancy, unspecified trimester: Secondary | ICD-10-CM

## 2018-10-22 DIAGNOSIS — O10913 Unspecified pre-existing hypertension complicating pregnancy, third trimester: Secondary | ICD-10-CM | POA: Diagnosis not present

## 2018-10-22 DIAGNOSIS — O0990 Supervision of high risk pregnancy, unspecified, unspecified trimester: Secondary | ICD-10-CM

## 2018-10-22 DIAGNOSIS — Z6841 Body Mass Index (BMI) 40.0 and over, adult: Secondary | ICD-10-CM | POA: Insufficient documentation

## 2018-10-22 DIAGNOSIS — Z3A28 28 weeks gestation of pregnancy: Secondary | ICD-10-CM

## 2018-10-22 DIAGNOSIS — O09523 Supervision of elderly multigravida, third trimester: Secondary | ICD-10-CM | POA: Diagnosis present

## 2018-10-22 DIAGNOSIS — Z23 Encounter for immunization: Secondary | ICD-10-CM

## 2018-10-22 NOTE — Progress Notes (Signed)
Prenatal Visit Note Date: 10/22/2018 Clinic: [facility]  Subjective:  Anna King is a 38 y.o. G3P2002 at [redacted]w[redacted]d being seen today for ongoing prenatal care.  She is currently monitored for the following issues for this high-risk pregnancy and has Essential hypertension; Supervision of high risk pregnancy, antepartum; Obesity affecting pregnancy; Advanced maternal age in multigravida; Chronic hypertension in pregnancy; and BMI 50.0-59.9, adult (HCC) on their problem list.  Patient reports no complaints.   Contractions: Not present. Vag. Bleeding: Scant.  Movement: Present. Denies leaking of fluid.   The following portions of the patient's history were reviewed and updated as appropriate: allergies, current medications, past family history, past medical history, past social history, past surgical history and problem list. Problem list updated.  Objective:   Vitals:   10/22/18 1349  BP: 116/71  Pulse: 85  Weight: (!) 348 lb (157.9 kg)    Fetal Status: Fetal Heart Rate (bpm): 152   Movement: Present     General:  Alert, oriented and cooperative. Patient is in no acute distress.  Skin: Skin is warm and dry. No rash noted.   Cardiovascular: Normal heart rate noted  Respiratory: Normal respiratory effort, no problems with respiration noted  Abdomen: Soft, gravid, appropriate for gestational age. Pain/Pressure: Present     Pelvic:  Cervical exam deferred        Extremities: Normal range of motion.  Edema: None  Mental Status: Normal mood and affect. Normal behavior. Normal judgment and thought content.   Urinalysis:      Assessment and Plan:  Pregnancy: G3P2002 at [redacted]w[redacted]d  1. Supervision of high risk pregnancy, antepartum Routine care. Last ate this morning. Will do 1hr, 28wk labs today. D/w her re: weight. Mirena. Trying to set up for home bp monitoring.   2. Multigravida of advanced maternal age in third trimester No issues  3. Chronic hypertension in  pregnancy Patient doing 200 in the morning; it was written for 100 bid. Continue with 200 in morning  Preterm labor symptoms and general obstetric precautions including but not limited to vaginal bleeding, contractions, leaking of fluid and fetal movement were reviewed in detail with the patient. Please refer to After Visit Summary for other counseling recommendations.  Return in about 2 weeks (around 11/05/2018) for hrob. virtual.   [place] Bing, MD

## 2018-10-22 NOTE — Addendum Note (Signed)
Addended by: Kathee Delton on: 10/22/2018 05:07 PM   Modules accepted: Orders

## 2018-10-23 ENCOUNTER — Telehealth: Payer: Self-pay

## 2018-10-23 LAB — GLUCOSE TOLERANCE, 1 HOUR: Glucose, 1Hr PP: 104 mg/dL (ref 65–199)

## 2018-10-23 LAB — CBC
Hematocrit: 35.8 % (ref 34.0–46.6)
Hemoglobin: 11.5 g/dL (ref 11.1–15.9)
MCH: 26.4 pg — ABNORMAL LOW (ref 26.6–33.0)
MCHC: 32.1 g/dL (ref 31.5–35.7)
MCV: 82 fL (ref 79–97)
Platelets: 313 10*3/uL (ref 150–450)
RBC: 4.35 x10E6/uL (ref 3.77–5.28)
RDW: 14.5 % (ref 11.7–15.4)
WBC: 9.6 10*3/uL (ref 3.4–10.8)

## 2018-10-23 LAB — HIV ANTIBODY (ROUTINE TESTING W REFLEX): HIV Screen 4th Generation wRfx: NONREACTIVE

## 2018-10-23 LAB — RPR: RPR Ser Ql: NONREACTIVE

## 2018-10-23 NOTE — Telephone Encounter (Signed)
Called to verify if patient has access to BP cuff. Left message.  Rolm Bookbinder, CNM 10/23/18 11:11 AM

## 2018-11-06 ENCOUNTER — Other Ambulatory Visit (HOSPITAL_COMMUNITY): Payer: Self-pay | Admitting: *Deleted

## 2018-11-06 ENCOUNTER — Other Ambulatory Visit: Payer: Self-pay

## 2018-11-06 ENCOUNTER — Ambulatory Visit: Payer: Medicare Other | Admitting: Obstetrics & Gynecology

## 2018-11-06 ENCOUNTER — Telehealth: Payer: Self-pay | Admitting: Obstetrics and Gynecology

## 2018-11-06 DIAGNOSIS — O99212 Obesity complicating pregnancy, second trimester: Secondary | ICD-10-CM

## 2018-11-06 DIAGNOSIS — O10913 Unspecified pre-existing hypertension complicating pregnancy, third trimester: Secondary | ICD-10-CM

## 2018-11-06 NOTE — Progress Notes (Signed)
dnka

## 2018-11-06 NOTE — Telephone Encounter (Signed)
The patient called about 20 minutes later stating she missed a call from our clinic. Informed the patient of rescheduling. Gave the patient the new appointment. She also stated she has the app downloaded.

## 2018-11-18 ENCOUNTER — Telehealth (INDEPENDENT_AMBULATORY_CARE_PROVIDER_SITE_OTHER): Payer: Medicare Other

## 2018-11-18 ENCOUNTER — Inpatient Hospital Stay (HOSPITAL_COMMUNITY)
Admission: AD | Admit: 2018-11-18 | Discharge: 2018-11-20 | DRG: 832 | Disposition: A | Discharge status: Home / Self Care | Payer: Medicare Other | Attending: Obstetrics and Gynecology | Admitting: Obstetrics and Gynecology

## 2018-11-18 ENCOUNTER — Other Ambulatory Visit: Payer: Self-pay

## 2018-11-18 ENCOUNTER — Encounter (HOSPITAL_COMMUNITY): Payer: Self-pay

## 2018-11-18 DIAGNOSIS — Y9301 Activity, walking, marching and hiking: Secondary | ICD-10-CM | POA: Diagnosis present

## 2018-11-18 DIAGNOSIS — W010XXA Fall on same level from slipping, tripping and stumbling without subsequent striking against object, initial encounter: Secondary | ICD-10-CM | POA: Diagnosis present

## 2018-11-18 DIAGNOSIS — O99210 Obesity complicating pregnancy, unspecified trimester: Secondary | ICD-10-CM | POA: Diagnosis not present

## 2018-11-18 DIAGNOSIS — Y92009 Unspecified place in unspecified non-institutional (private) residence as the place of occurrence of the external cause: Secondary | ICD-10-CM

## 2018-11-18 DIAGNOSIS — O09523 Supervision of elderly multigravida, third trimester: Secondary | ICD-10-CM | POA: Diagnosis not present

## 2018-11-18 DIAGNOSIS — Z3A32 32 weeks gestation of pregnancy: Secondary | ICD-10-CM

## 2018-11-18 DIAGNOSIS — O10919 Unspecified pre-existing hypertension complicating pregnancy, unspecified trimester: Secondary | ICD-10-CM

## 2018-11-18 DIAGNOSIS — Z1159 Encounter for screening for other viral diseases: Secondary | ICD-10-CM | POA: Diagnosis not present

## 2018-11-18 DIAGNOSIS — O10013 Pre-existing essential hypertension complicating pregnancy, third trimester: Secondary | ICD-10-CM | POA: Diagnosis present

## 2018-11-18 DIAGNOSIS — O10913 Unspecified pre-existing hypertension complicating pregnancy, third trimester: Secondary | ICD-10-CM | POA: Diagnosis not present

## 2018-11-18 DIAGNOSIS — Z362 Encounter for other antenatal screening follow-up: Secondary | ICD-10-CM | POA: Diagnosis not present

## 2018-11-18 DIAGNOSIS — O26893 Other specified pregnancy related conditions, third trimester: Secondary | ICD-10-CM | POA: Diagnosis present

## 2018-11-18 DIAGNOSIS — O3413 Maternal care for benign tumor of corpus uteri, third trimester: Secondary | ICD-10-CM | POA: Diagnosis not present

## 2018-11-18 DIAGNOSIS — O0993 Supervision of high risk pregnancy, unspecified, third trimester: Secondary | ICD-10-CM | POA: Diagnosis not present

## 2018-11-18 LAB — URINALYSIS, ROUTINE W REFLEX MICROSCOPIC
Bilirubin Urine: NEGATIVE
Glucose, UA: NEGATIVE mg/dL
Hgb urine dipstick: NEGATIVE
Ketones, ur: 5 mg/dL — AB
Nitrite: NEGATIVE
Protein, ur: 30 mg/dL — AB
Specific Gravity, Urine: 1.032 — ABNORMAL HIGH (ref 1.005–1.030)
WBC, UA: >50 WBC/hpf — ABNORMAL HIGH (ref 0–5)
pH: 5 (ref 5.0–8.0)

## 2018-11-18 LAB — TYPE AND SCREEN
ABO/RH(D): O POS
Antibody Screen: NEGATIVE

## 2018-11-18 LAB — CBC
HCT: 35.1 % — ABNORMAL LOW (ref 36.0–46.0)
Hemoglobin: 11.3 g/dL — ABNORMAL LOW (ref 12.0–15.0)
MCH: 26.3 pg (ref 26.0–34.0)
MCHC: 32.2 g/dL (ref 30.0–36.0)
MCV: 81.6 fL (ref 80.0–100.0)
Platelets: 273 10*3/uL (ref 150–400)
RBC: 4.3 MIL/uL (ref 3.87–5.11)
RDW: 15.2 % (ref 11.5–15.5)
WBC: 12.9 10*3/uL — ABNORMAL HIGH (ref 4.0–10.5)
nRBC: 0 % (ref 0.0–0.2)

## 2018-11-18 LAB — SARS CORONAVIRUS 2 BY RT PCR (HOSPITAL ORDER, PERFORMED IN ~~LOC~~ HOSPITAL LAB): SARS Coronavirus 2: NEGATIVE

## 2018-11-18 LAB — ABO/RH: ABO/RH(D): O POS

## 2018-11-18 MED ORDER — MAGNESIUM SULFATE 40 G IN LACTATED RINGERS - SIMPLE
2.0000 g/h | INTRAVENOUS | Status: AC
  Administered 2018-11-19: 18:00:00 2 g/h via INTRAVENOUS
  Filled 2018-11-18 (×2): qty 500

## 2018-11-18 MED ORDER — ONDANSETRON HCL 4 MG/2ML IJ SOLN: 4.0000 mg | Freq: Four times a day (QID) | INTRAMUSCULAR | Status: DC | PRN

## 2018-11-18 MED ORDER — BETAMETHASONE SOD PHOS & ACET 6 (3-3) MG/ML IJ SUSP
12.0000 mg | INTRAMUSCULAR | Status: AC
  Administered 2018-11-18 – 2018-11-19 (×2): 12 mg via INTRAMUSCULAR
  Filled 2018-11-18 (×3): qty 2

## 2018-11-18 MED ORDER — LACTATED RINGERS IV SOLN: 500.0000 mL | INTRAVENOUS | Status: DC | PRN

## 2018-11-18 MED ORDER — LACTATED RINGERS IV SOLN
INTRAVENOUS | Status: DC
  Administered 2018-11-18 – 2018-11-19 (×2): via INTRAVENOUS

## 2018-11-18 MED ORDER — MAGNESIUM SULFATE BOLUS VIA INFUSION
4.0000 g | Freq: Once | INTRAVENOUS | Status: AC
  Administered 2018-11-18: 23:00:00 4 g via INTRAVENOUS
  Filled 2018-11-18: qty 500

## 2018-11-18 MED ORDER — LIDOCAINE HCL (PF) 1 % IJ SOLN: 30.0000 mL | INTRAMUSCULAR | Status: DC | PRN

## 2018-11-18 MED ORDER — OXYTOCIN BOLUS FROM INFUSION: 500.0000 mL | Freq: Once | INTRAVENOUS | Status: DC

## 2018-11-18 MED ORDER — HYDROXYZINE HCL 50 MG PO TABS
50.0000 mg | ORAL_TABLET | Freq: Four times a day (QID) | ORAL | Status: DC | PRN
  Administered 2018-11-19: 50 mg via ORAL
  Filled 2018-11-18 (×2): qty 1

## 2018-11-18 MED ORDER — CYCLOBENZAPRINE HCL 10 MG PO TABS
10.0000 mg | ORAL_TABLET | Freq: Once | ORAL | Status: AC
  Administered 2018-11-18: 20:00:00 10 mg via ORAL
  Filled 2018-11-18: qty 1

## 2018-11-18 MED ORDER — ACETAMINOPHEN 325 MG PO TABS: 650.0000 mg | ORAL_TABLET | ORAL | Status: DC | PRN

## 2018-11-18 MED ORDER — OXYTOCIN 40 UNITS IN NORMAL SALINE INFUSION - SIMPLE MED: 2.5000 [IU]/h | INTRAVENOUS | Status: DC

## 2018-11-18 NOTE — MAU Provider Note (Signed)
History     CSN: [id number]  Arrival date and time: 11/18/18 1736   First Provider Initiated Contact with Patient 11/18/18 1908      Chief Complaint  Patient presents with  . Fall   HPI  Ms.  Anna King is a 38 y.o. year old 733P2002 female at 5656w2d weeks gestation who presents to MAU reporting that she slipped and fell while carrying her infant cousin. She hit her RT knee and landed on her back this AM at 1000. Since the fall she has had "contractions" in her back and "my uterus hurts." She used a rice sock earlier with relief enough to go to sleep, but wok up very stiff and in more pain. She reports very good FM. She denies VB or LOF. She called [facility] after waking up this afternoon and was instructed to come to MAU for evaluation.   Past Medical History:  Diagnosis Date  . Asthma   . BV (bacterial vaginosis)   . Chlamydia   . Hypertension     Past Surgical History:  Procedure Laterality Date  . WISDOM TOOTH EXTRACTION      Family History  Problem Relation Age of Onset  . Diabetes Mother   . Hypertension Mother     Social History   Tobacco Use  . Smoking status: Never Smoker  . Smokeless tobacco: Never Used  Substance Use Topics  . Alcohol use: No  . Drug use: No    Allergies:  Allergies  Allergen Reactions  . Shellfish Allergy Anaphylaxis  . Betadine [Povidone Iodine] Rash    Itching     Medications Prior to Admission  Medication Sig Dispense Refill Last Dose  . ferrous sulfate 325 (65 FE) MG EC tablet Take 325 mg by mouth 3 (three) times daily with meals.   11/18/2018 at Unknown time  . labetalol (NORMODYNE) 200 MG tablet Take 0.5 tablets (100 mg total) by mouth 2 (two) times daily. 60 tablet 3 11/18/2018 at Unknown time  . Prenatal Vit-Fe Fumarate-FA (PRENATAL COMPLETE) 14-0.4 MG TABS Take 1 tablet by mouth daily. 60 each 0 11/18/2018 at Unknown time  . aspirin EC 81 MG tablet Take 1 tablet (81 mg total) by mouth daily. Take after 12 weeks for  prevention of preeclampsia later in pregnancy 300 tablet 2 Taking    Review of Systems  Constitutional: Negative.   HENT: Negative.   Eyes: Negative.   Respiratory: Negative.   Cardiovascular: Negative.   Gastrointestinal: Positive for abdominal pain.  Endocrine: Negative.   Genitourinary: Positive for pelvic pain.  Musculoskeletal: Negative.   Skin: Negative.   Allergic/Immunologic: Negative.   Neurological: Negative.   Hematological: Negative.   Psychiatric/Behavioral: Negative.    Physical Exam   Blood pressure 129/80, pulse (!) 115, temperature 98.2 F (36.8 C), temperature source Oral, resp. rate 20, last menstrual period 04/06/2018, SpO2 98 %.  Physical Exam  Nursing note and vitals reviewed. Constitutional: She is oriented to person, place, and time. She appears well-developed and well-nourished.  HENT:  Head: Normocephalic and atraumatic.  Eyes: Pupils are equal, round, and reactive to light.  Neck: Normal range of motion.  Cardiovascular: Normal rate.  Respiratory: Effort normal.  GI: Soft.  Genitourinary:    Genitourinary Comments: Dilation: 2.5 Effacement (%): 50 Cervical Position: Middle Station: Ballotable Presentation: Undeterminable Exam by: Carloyn Jaeger. Chaunce Winkels, CNM    Musculoskeletal: Normal range of motion.  Neurological: She is alert and oriented to person, place, and time.  Skin: Skin is warm and  dry.  Psychiatric: She has a normal mood and affect. Her behavior is normal. Judgment and thought content normal.    MAU Course  Procedures  MDM [facility] Flexeril 10 mg po -- pain unresolved NST - FHR: 135 bpm / moderate variability / accels present / decels absent / TOCO:unable to determine with external monitoring d/t maternal body habitus -- pt reports contractions even after medication  *Consult with Dr. Adrian Blackwater @ 2109 - notified of patient's complaints, assessments, lab & NST results, recommended tx plan admit to L&D, start MgSO4, give BMZ 12 mg now and again  24 hrs  Consult with Dr. Leary Roca @ 2113 - notified of patient's admission -- ok to admit to hospital, but notify patient that if baby delivers early there is a chance the baby may have to be transferred  Results for orders placed or performed during the hospital encounter of 11/18/18 (from the past 24 hour(s))  Urinalysis, Routine w reflex microscopic     Status: Abnormal   Collection Time: 11/18/18  6:18 PM  Result Value Ref Range   Color, Urine AMBER (A) YELLOW   APPearance CLOUDY (A) CLEAR   Specific Gravity, Urine 1.032 (H) 1.005 - 1.030   pH 5.0 5.0 - 8.0   Glucose, UA NEGATIVE NEGATIVE mg/dL   Hgb urine dipstick NEGATIVE NEGATIVE   Bilirubin Urine NEGATIVE NEGATIVE   Ketones, ur 5 (A) NEGATIVE mg/dL   Protein, ur 30 (A) NEGATIVE mg/dL   Nitrite NEGATIVE NEGATIVE   Leukocytes,Ua LARGE (A) NEGATIVE   RBC / HPF 0-5 0 - 5 RBC/hpf   WBC, UA >50 (H) 0 - 5 WBC/hpf   Bacteria, UA MANY (A) NONE SEEN   Squamous Epithelial / LPF 21-50 0 - 5   Mucus PRESENT    Ca Oxalate Crys, UA PRESENT     Assessment and Plan  Preterm labor in third trimester without delivery - Admit to L&D - MgSO4 4 gm loading dose, then 2 gm/hr - BMZ 12 mg 1 dose every 24 hrs x 2 doses - Report given to M. Mayford Knife, CNM and Dr. Adrian Blackwater -- care assumed upon admission  Raelyn Mora, MSN, CNM 11/18/2018, 9:18 PM

## 2018-11-18 NOTE — H&P (Signed)
Anna King is a 38 y.o. female presenting for Fall at home with resultant contractions which led to cervical dilation.  She is therefore being admitted for observation, Magnesium Sulfate, and Betamethasone series  MAU note: Ms.  Anna King is a 38 y.o. year old [id number] female at 1347w2d weeks gestation who presents to MAU reporting that she slipped and fell while carrying her infant cousin. She hit her RT knee and landed on her back this AM at 1000. Since the fall she has had "contractions" in her back and "my uterus hurts." She used a rice sock earlier with relief enough to go to sleep, but wok up very stiff and in more pain. She reports very good FM. She denies VB or LOF . OB History    Gravida  3   Para  2   Term  2   Preterm      AB      Living  2     SAB      TAB      Ectopic      Multiple      Live Births  2          Past Medical History:  Diagnosis Date  . Asthma   . BV (bacterial vaginosis)   . Chlamydia   . Hypertension    Past Surgical History:  Procedure Laterality Date  . WISDOM TOOTH EXTRACTION     Family History: family history includes Diabetes in her mother; Hypertension in her mother. Social History:  reports that she has never smoked. She has never used smokeless tobacco. She reports that she does not drink alcohol or use drugs.     Maternal Diabetes: No Genetic Screening: Normal Maternal Ultrasounds/Referrals: Normal Fetal Ultrasounds or other Referrals:  None Maternal Substance Abuse:  No Significant Maternal Medications:  None Significant Maternal Lab Results:  None Other Comments:  None  Review of Systems  Constitutional: Negative for chills and fever.  Respiratory: Negative for shortness of breath.   Gastrointestinal: Positive for abdominal pain. Negative for constipation, diarrhea, nausea and vomiting.   Maternal Medical History:  Reason for admission: Contractions.  Nausea.  Contractions: Onset was 13-24 hours ago.    Frequency: irregular.   Perceived severity is mild.    Fetal activity: Perceived fetal activity is normal.   Last perceived fetal movement was within the past hour.    Prenatal complications: PIH and preterm labor.   No bleeding, placental abnormality or pre-eclampsia.   Prenatal Complications - Diabetes: none.    Dilation: 2.5 Effacement (%): 50 Station: Ballotable Exam by:: Carloyn Jaeger. Dawson, CNM Blood pressure 129/80, pulse (!) 115, temperature 98.2 F (36.8 C), temperature source Oral, resp. rate 20, last menstrual period 04/06/2018, SpO2 98 %, unknown if currently breastfeeding. Maternal Exam:  Uterine Assessment: Contraction strength is mild.  Contraction frequency is irregular.   Abdomen: Patient reports no abdominal tenderness. Introitus: Normal vulva. Normal vagina.  Ferning test: not done.  Nitrazine test: not done. Amniotic fluid character: not assessed.  Pelvis: adequate for delivery.   Cervix: Cervix evaluated by digital exam.     Fetal Exam Fetal Monitor Review: Mode: ultrasound.   Baseline rate: 140.  Variability: moderate (6-25 bpm).   Pattern: accelerations present and no decelerations.    Fetal State Assessment: Category I - tracings are normal.     Physical Exam  Constitutional: She is oriented to person, place, and time. She appears well-developed and well-nourished. No distress.  HENT:  Head:  Normocephalic.  Cardiovascular: Normal rate and regular rhythm.  Respiratory: Effort normal. No respiratory distress.  GI: Soft. She exhibits no distension. There is no abdominal tenderness. There is no rebound and no guarding.  Genitourinary:    Vulva normal.     Genitourinary Comments: Dilation: 2.5 Effacement (%): 50 Cervical Position: Middle Station: Ballotable Presentation: Undeterminable Exam by:: Carloyn Jaeger, CNM    Musculoskeletal: Normal range of motion.  Neurological: She is alert and oriented to person, place, and time.  Skin: Skin is warm and  dry.  Psychiatric: She has a normal mood and affect.    Prenatal labs: ABO, Rh: --/--/PENDING (05/26 2215) Antibody: PENDING (05/26 2215) Rubella: 1.74 (01/08 1127) RPR: Non Reactive (04/29 1538)  HBsAg: Negative (01/08 1127)  HIV: Non Reactive (04/29 1538)  GBS:     Assessment/Plan: SIngle intrauterine pregnancy at [redacted]w[redacted]d S/P Fall Preterm uterine contractions Preterm cervical dilation  Admit to Labor and Delivery Routine orders Magnesium Sulfate infusion Betamethasone series   Wynelle Bourgeois 11/18/2018, 10:46 PM

## 2018-11-18 NOTE — Telephone Encounter (Signed)
Pt called and stated that she feel today and is having sharp pain and does not feel.  Can someone call her tomorrow. LM for pt that I am returning her call and to please go to MAU asap in regards to her concern.

## 2018-11-18 NOTE — MAU Note (Signed)
Slipped and fell, hit rt knee and thinks maybe her back.  Happened this morning.  Back has been hurting, ? Contractions in her back. No bleeding or water leaking.  Felt baby moving earlier, unsure now cause she is hurting so much.  Uterus is also hurting bad.

## 2018-11-19 ENCOUNTER — Telehealth: Payer: Self-pay | Admitting: Lactation Services

## 2018-11-19 ENCOUNTER — Inpatient Hospital Stay (HOSPITAL_COMMUNITY): Payer: Medicare Other

## 2018-11-19 DIAGNOSIS — Z362 Encounter for other antenatal screening follow-up: Secondary | ICD-10-CM

## 2018-11-19 DIAGNOSIS — O10013 Pre-existing essential hypertension complicating pregnancy, third trimester: Secondary | ICD-10-CM

## 2018-11-19 DIAGNOSIS — Z3A32 32 weeks gestation of pregnancy: Secondary | ICD-10-CM

## 2018-11-19 DIAGNOSIS — O3413 Maternal care for benign tumor of corpus uteri, third trimester: Secondary | ICD-10-CM

## 2018-11-19 DIAGNOSIS — O09523 Supervision of elderly multigravida, third trimester: Secondary | ICD-10-CM

## 2018-11-19 DIAGNOSIS — O99210 Obesity complicating pregnancy, unspecified trimester: Secondary | ICD-10-CM

## 2018-11-19 LAB — RPR: RPR Ser Ql: NONREACTIVE

## 2018-11-19 MED ORDER — LABETALOL HCL 100 MG PO TABS
100.0000 mg | ORAL_TABLET | Freq: Two times a day (BID) | ORAL | Status: DC
  Administered 2018-11-19 – 2018-11-20 (×3): 100 mg via ORAL
  Filled 2018-11-19 (×4): qty 1

## 2018-11-19 MED ORDER — ZOLPIDEM TARTRATE 5 MG PO TABS: 5.0000 mg | ORAL_TABLET | Freq: Every evening | ORAL | Status: DC | PRN

## 2018-11-19 MED ORDER — LACTATED RINGERS IV SOLN
INTRAVENOUS | Status: AC
  Administered 2018-11-19 (×2): via INTRAVENOUS

## 2018-11-19 MED ORDER — PRENATAL MULTIVITAMIN CH
1.0000 | ORAL_TABLET | Freq: Every day | ORAL | Status: DC
  Administered 2018-11-19: 13:00:00 1 via ORAL
  Filled 2018-11-19 (×2): qty 1

## 2018-11-19 MED ORDER — DOCUSATE SODIUM 100 MG PO CAPS
100.0000 mg | ORAL_CAPSULE | Freq: Every day | ORAL | Status: DC
  Administered 2018-11-19 – 2018-11-20 (×2): 100 mg via ORAL
  Filled 2018-11-19 (×2): qty 1

## 2018-11-19 MED ORDER — ACETAMINOPHEN 325 MG PO TABS: 650.0000 mg | ORAL_TABLET | ORAL | Status: DC | PRN

## 2018-11-19 MED ORDER — FERROUS SULFATE 325 (65 FE) MG PO TABS
325.0000 mg | ORAL_TABLET | Freq: Three times a day (TID) | ORAL | Status: DC
  Administered 2018-11-19 – 2018-11-20 (×3): 325 mg via ORAL
  Filled 2018-11-19 (×4): qty 1

## 2018-11-19 MED ORDER — ASPIRIN EC 81 MG PO TBEC
81.0000 mg | DELAYED_RELEASE_TABLET | Freq: Every day | ORAL | Status: DC
  Administered 2018-11-19 – 2018-11-20 (×2): 81 mg via ORAL
  Filled 2018-11-19 (×3): qty 1

## 2018-11-19 MED ORDER — CALCIUM CARBONATE ANTACID 500 MG PO CHEW
2.0000 | CHEWABLE_TABLET | ORAL | Status: DC | PRN
  Administered 2018-11-19: 13:00:00 400 mg via ORAL
  Filled 2018-11-19: qty 2

## 2018-11-19 NOTE — Progress Notes (Signed)
OB Note Category I with accels, toco negative Pt asymptomatic. Cx unchanged at 2-3/50/high Okay to eat. Keep Mg until 2nd bmz tonight.  Will see if can get growth u/s today or prior to discharge tomorrow   Cornelia Copa MD Attending Center for [facility] (Faculty [facility]) 11/19/2018 Time: 1153am

## 2018-11-19 NOTE — Telephone Encounter (Signed)
Pt called and left message on nurse voicemail. She reports she missed a call from [place] and was returning her call. Pt did not leave information on what the call was about. Routed to Clinical Pool for Follow up.

## 2018-11-19 NOTE — Progress Notes (Signed)
Patient ID: Anna King, female   DOB: [birth year], 38 y.o.   MRN: [id number] Requesting to eat  Vitals:   11/19/18 0003 11/19/18 0103 11/19/18 0145 11/19/18 0412  BP: 116/63 121/74 (!) 123/59 133/82  Pulse: 95 95 91 96  Resp: 18 18    Temp:   97.7 F (36.5 C)   TempSrc:   Oral   SpO2:       Fetal heart rate has been reassuring all night UCs have spaced out to every 5-9 min with occasional irritability  Cervix:  Deferred  Will let her eat and reeval in am for possible transfer to High Risk.

## 2018-11-19 NOTE — Telephone Encounter (Signed)
Called and spoke with pt. Pt is in the hospital with PTL and cervical dilation. She is to stay in the hospital through at least tomorrow.   Pt concerned she will miss her appts tomorrow, informed her I would get them cancelled and she is to follow up again on 6/4 at 0945 with Maternal Fetal Medicine. Pt voiced understanding.   MFM Registrar notified verbally that pt is currently in the hospital and does not expect to be discharged before tomorrow for follow up.   Pt to call with further questions/concerns as needed.

## 2018-11-20 ENCOUNTER — Encounter (HOSPITAL_COMMUNITY): Payer: Self-pay

## 2018-11-20 ENCOUNTER — Ambulatory Visit (HOSPITAL_COMMUNITY): Admission: RE | Admit: 2018-11-20 | Payer: Medicare Other | Source: Ambulatory Visit

## 2018-11-20 ENCOUNTER — Encounter: Payer: Self-pay | Admitting: Obstetrics and Gynecology

## 2018-11-20 ENCOUNTER — Ambulatory Visit (HOSPITAL_COMMUNITY): Payer: Medicare Other

## 2018-11-20 ENCOUNTER — Ambulatory Visit (INDEPENDENT_AMBULATORY_CARE_PROVIDER_SITE_OTHER): Payer: Medicare Other | Admitting: Obstetrics and Gynecology

## 2018-11-20 DIAGNOSIS — O10913 Unspecified pre-existing hypertension complicating pregnancy, third trimester: Secondary | ICD-10-CM | POA: Diagnosis not present

## 2018-11-20 DIAGNOSIS — O09523 Supervision of elderly multigravida, third trimester: Secondary | ICD-10-CM

## 2018-11-20 DIAGNOSIS — O0990 Supervision of high risk pregnancy, unspecified, unspecified trimester: Secondary | ICD-10-CM

## 2018-11-20 DIAGNOSIS — Z3A32 32 weeks gestation of pregnancy: Secondary | ICD-10-CM

## 2018-11-20 DIAGNOSIS — O0993 Supervision of high risk pregnancy, unspecified, third trimester: Secondary | ICD-10-CM

## 2018-11-20 DIAGNOSIS — O10919 Unspecified pre-existing hypertension complicating pregnancy, unspecified trimester: Secondary | ICD-10-CM

## 2018-11-20 DIAGNOSIS — Z6841 Body Mass Index (BMI) 40.0 and over, adult: Secondary | ICD-10-CM

## 2018-11-20 NOTE — Discharge Summary (Signed)
Discharge Summary   Admit Date: 11/18/2018 Discharge Date: 11/20/2018 Discharging Service: Antepartum  Primary OBGYN: [facility] Admitting Physician: Levie HeritageJacob J Stinson, DO  Discharge Physician: Vergie LivingPickens  Referring Provider: MAU  Primary Care Provider: Quitman LivingsHassan, Sami, MD  Admission Diagnoses: *Pregnancy at 32/2 *s/p fall on back *preterm labor (2-3cm/50) *cHTN *AMA  Discharge Diagnoses: *Pregnancy at 32/4 *resolved preterm labor (unchanged cervix at 2-3/50) *cHTN *AMA  Consult Orders: None   Surgeries/Procedures Performed: None  History and Physical: Anna King is a 38 y.o. female presenting for Fall at home with resultant contractions which led to cervical dilation.  She is therefore being admitted for observation, Magnesium Sulfate, and Betamethasone series  MAU note: Ms.Anna King a 38 y.o.year old 393P2002 female at 5655w2d weeks gestation who presents to MAU reporting that she slipped and fell while carrying her infant cousin.She hit her RT knee and landed on her back this AM at 1000. Since the fall she has had "contractions" in her back and "my uterus hurts." She used a rice sock earlier with relief enough to go to sleep, but wok up very stiff and in more pain. She reports very good FM. She denies VB or LOF .         OB History    Gravida  3   Para  2   Term  2   Preterm      AB      Living  2     SAB      TAB      Ectopic      Multiple      Live Births  2              Past Medical History:  Diagnosis Date  . Asthma   . BV (bacterial vaginosis)   . Chlamydia   . Hypertension         Past Surgical History:  Procedure Laterality Date  . WISDOM TOOTH EXTRACTION     Family History: family history includes Diabetes in her mother; Hypertension in her mother. Social History:  reports that she has never smoked. She has never used smokeless tobacco. She reports that she does not drink alcohol or use drugs.      Maternal Diabetes: No Genetic Screening: Normal Maternal Ultrasounds/Referrals: Normal Fetal Ultrasounds or other Referrals:  None Maternal Substance Abuse:  No Significant Maternal Medications:  None Significant Maternal Lab Results:  None Other Comments:  None  Review of Systems  Constitutional: Negative for chills and fever.  Respiratory: Negative for shortness of breath.   Gastrointestinal: Positive for abdominal pain. Negative for constipation, diarrhea, nausea and vomiting.   Maternal Medical History:  Reason for admission: Contractions.  Nausea.  Contractions: Onset was 13-24 hours ago.   Frequency: irregular.   Perceived severity is mild.    Fetal activity: Perceived fetal activity is normal.   Last perceived fetal movement was within the past hour.    Prenatal complications: PIH and preterm labor.   No bleeding, placental abnormality or pre-eclampsia.   Prenatal Complications - Diabetes: none.    Dilation: 2.5 Effacement (%): 50 Station: Ballotable Exam by:: Carloyn Jaeger. Dawson, CNM Blood pressure 129/80, pulse (!) 115, temperature 98.2 F (36.8 C), temperature source Oral, resp. rate 20, last menstrual period 04/06/2018, SpO2 98 %, unknown if currently breastfeeding. Maternal Exam:  Uterine Assessment: Contraction strength is mild.  Contraction frequency is irregular.   Abdomen: Patient reports no abdominal tenderness. Introitus: Normal vulva. Normal vagina.  Ferning test:  not done.  Nitrazine test: not done. Amniotic fluid character: not assessed.  Pelvis: adequate for delivery.   Cervix: Cervix evaluated by digital exam.     Fetal Exam Fetal Monitor Review: Mode: ultrasound.   Baseline rate: 140.  Variability: moderate (6-25 bpm).   Pattern: accelerations present and no decelerations.    Fetal State Assessment: Category I - tracings are normal.     Physical Exam  Constitutional: She is oriented to person, place, and time. She appears  well-developed and well-nourished. No distress.  HENT:  Head: Normocephalic.  Cardiovascular: Normal rate and regular rhythm.  Respiratory: Effort normal. No respiratory distress.  GI: Soft. She exhibits no distension. There is no abdominal tenderness. There is no rebound and no guarding.  Genitourinary:    Vulva normal.     Genitourinary Comments: Dilation: 2.5 Effacement (%): 50 Cervical Position: Middle Station: Ballotable Presentation: Undeterminable Exam by:: Carloyn Jaeger, CNM    Musculoskeletal: Normal range of motion.  Neurological: She is alert and oriented to person, place, and time.  Skin: Skin is warm and dry.  Psychiatric: She has a normal mood and affect.    Prenatal labs: ABO, Rh: --/--/PENDING (05/26 2215) Antibody: PENDING (05/26 2215) Rubella: 1.74 (01/08 1127) RPR: Non Reactive (04/29 1538)  HBsAg: Negative (01/08 1127)  HIV: Non Reactive (04/29 1538)  GBS:     Assessment/Plan: SIngle intrauterine pregnancy at [redacted]w[redacted]d S/P Fall Preterm uterine contractions Preterm cervical dilation  Admit to Labor and Delivery Routine orders Magnesium Sulfate infusion Betamethasone series              Wynelle Bourgeois 11/18/2018, 10:46 PM           Cosigned by: Levie Heritage, DO at 11/18/2018 11:48 PM  Electronically signed by Aviva Signs, CNM at 11/18/2018 10:52 PM Electronically signed by Levie Heritage, DO at 11/18/2018 11:48 PM    Hospital Course: Patient received BMZ course on HD#1 and HD#2 and received Mg for 24hrs. She had an unchanged cervix compared to admission and no s/s of PTL on ROS or on EFM. 5/27 surveillance u/s showed bpp 8/8, efw 36% and ac 51%, cephalic.   cHTN stable and no change in meds  Discharge Exam:   Current Vital Signs 24h Vital Sign Ranges  T 97.9 F (36.6 C) Temp  Avg: 98 F (36.7 C)  Min: 97.7 F (36.5 C)  Max: 98.5 F (36.9 C)  BP 119/67 BP  Min: 119/67  Max: 142/78  HR 78 Pulse  Avg: 87.1  Min: 78  Max: 96  RR 18  Resp  Avg: 18.1  Min: 17  Max: 20  SaO2 98 % Room Air SpO2  Avg: 97.3 %  Min: 95 %  Max: 98 %       24 Hour I/O Current Shift I/O  Time Ins Outs 05/27 0701 - 05/28 0700 In: 3075.7 [P.O.:1580; I.V.:1495.7] Out: 2075 [Urine:2075] 05/28 0701 - 05/28 1900 In: -  Out: 1000 [Urine:1000]   Patient Vitals for the past 12 hrs:  BP Temp Temp src Pulse Resp SpO2  11/20/18 0754 119/67 97.9 F (36.6 C) Oral 78 18 98 %  11/20/18 0517 120/64 98 F (36.7 C) Oral 93 17 98 %  11/19/18 2316 128/80 97.9 F (36.6 C) Oral 79 18 98 %    Patient Vitals for the past 6 hrs:  BP Temp Temp src Pulse Resp SpO2  11/20/18 0754 119/67 97.9 F (36.6 C) Oral 78 18 98 %  11/20/18  0517 120/64 98 F (36.7 C) Oral 93 17 98 %     Patient Vitals for the past 24 hrs:  BP Temp Temp src Pulse Resp SpO2 Height Weight  11/20/18 0754 119/67 97.9 F (36.6 C) Oral 78 18 98 % - -  11/20/18 0517 120/64 98 F (36.7 C) Oral 93 17 98 % - -  11/19/18 2316 128/80 97.9 F (36.6 C) Oral 79 18 98 % - -  11/19/18 2001 125/88 98.5 F (36.9 C) Oral 84 18 97 % - -  11/19/18 1900 - - - - 18 - - -  11/19/18 1800 - - - - 17 - - -  11/19/18 1700 - - - - 18 - - -  11/19/18 1600 - - - - 18 - - -  11/19/18 1522 135/81 98.2 F (36.8 C) Oral 87 19 97 % - -  11/19/18 1500 - - - - 18 - - -  11/19/18 1300 - - - - 18 - - -  11/19/18 1230 - - - - - - - (!) 161.6 kg  11/19/18 1218 (!) 142/78 97.7 F (36.5 C) Oral 93 18 98 % - -  11/19/18 1200 - - - - 20 - - -  11/19/18 1100 - - - - 18 - - -  11/19/18 5051 129/68 97.7 F (36.5 C) Oral 96 18 95 % 5\' 6"  (1.676 m) -    General appearance: Well nourished, well developed female in no acute distress.  Neck:  Supple, normal appearance, and no thyromegaly  Cardiovascular: S1, S2 normal, no murmur, rub or gallop, regular rate and rhythm Respiratory:  Clear to auscultation bilateral. Normal respiratory effort Abdomen:gravid, nttp, obese Neuro/Psych:  Normal mood and affect.  Skin:  Warm  and dry.  Lymphatic:  No inguinal lymphadenopathy.   Pelvic exam: 2-3/50/high  Discharge Disposition:  Home  Patient Instructions:  Standard   Results Pending at Discharge:  none  Discharge Medications: Allergies as of 11/20/2018      Reactions   Shellfish Allergy Anaphylaxis   Betadine [povidone Iodine] Rash   Itching      Medication List    TAKE these medications   aspirin EC 81 MG tablet Take 1 tablet (81 mg total) by mouth daily. Take after 12 weeks for prevention of preeclampsia later in pregnancy   ferrous sulfate 325 (65 FE) MG EC tablet Take 325 mg by mouth 3 (three) times daily with King.   labetalol 200 MG tablet Commonly known as:  NORMODYNE Take 0.5 tablets (100 mg total) by mouth 2 (two) times daily.   Prenatal Complete 14-0.4 MG Tabs Take 1 tablet by mouth daily.        Future Appointments  Date Time Provider Department Center  11/20/2018  9:45 AM [facility]-MFC NURSE WH-MFC MFC-US  11/20/2018  9:45 AM WH-MFC [place] 2 WH-MFCUS MFC-US  11/20/2018  3:35 PM Conan Bowens, MD [facility]  11/27/2018  9:45 AM WH-MFC NURSE WH-MFC MFC-US  11/27/2018  9:45 AM WH-MFC [place] 5 WH-MFCUS MFC-US  12/04/2018 10:00 AM WH-MFC NURSE WH-MFC MFC-US  12/04/2018 10:00 AM WH-MFC [place] 3 WH-MFCUS MFC-US    Cornelia Copa MD Attending Center for [facility])

## 2018-11-20 NOTE — Progress Notes (Signed)
   TELEHEALTH OBSTETRICS PRENATAL VIRTUAL VIDEO VISIT ENCOUNTER NOTE  Provider location: Center for [employer] at [facility]   I connected with [name] on 11/20/18 at  3:35 PM EDT by WebEx Video Encounter at home and verified that I am speaking with the correct person using two identifiers.  I discussed the limitations, risks, security and privacy concerns of performing an evaluation and management service by telephone and the availability of in person appointments. I also discussed with the patient that there may be a patient responsible charge related to this service. The patient expressed understanding and agreed to proceed. Subjective:  KAYLEY LOLLI is a 38 y.o. G3P2002 at [redacted]w[redacted]d being seen today for ongoing prenatal care.  She is currently monitored for the following issues for this high-risk pregnancy and has Essential hypertension; Supervision of high risk pregnancy, antepartum; Obesity affecting pregnancy; Advanced maternal age in multigravida; Chronic hypertension in pregnancy; BMI 50.0-59.9, adult (HCC); and Preterm labor in third trimester on their problem list.  Patient reports occasional contractions.  Contractions: Not present. Vag. Bleeding: Scant.  Movement: Present. Denies any leaking of fluid.   The following portions of the patient's history were reviewed and updated as appropriate: allergies, current medications, past family history, past medical history, past social history, past surgical history and problem list.   Objective:  There were no vitals filed for this visit.  Fetal Status:     Movement: Present     General:  Alert, oriented and cooperative. Patient is in no acute distress.  Respiratory: Normal respiratory effort, no problems with respiration noted  Mental Status: Normal mood and affect. Normal behavior. Normal judgment and thought content.  Rest of physical exam deferred due to type of encounter  Imaging: No results found.  Assessment and  Plan:  Pregnancy: G3P2002 at [redacted]w[redacted]d  1. Supervision of high risk pregnancy, antepartum  2. Chronic hypertension in pregnancy Cont labetalol 100 mg BID Cont baby ASA  3. Multigravida of advanced maternal age in third trimester  4. BMI 50.0-59.9, adult (HCC)  5. Preterm labor in third trimester without delivery Discharged this am  Preterm labor symptoms and general obstetric precautions including but not limited to vaginal bleeding, contractions, leaking of fluid and fetal movement were reviewed in detail with the patient. I discussed the assessment and treatment plan with the patient. The patient was provided an opportunity to ask questions and all were answered. The patient agreed with the plan and demonstrated an understanding of the instructions. The patient was advised to call back or seek an in-person office evaluation/go to MAU at [facility] for any urgent or concerning symptoms. Please refer to After Visit Summary for other counseling recommendations.   I provided 16 minutes of face-to-face time during this encounter.  Return in about 2 weeks (around 12/04/2018) for OB visit (MD), virtual.  Future Appointments  Date Time Provider Department Center  11/27/2018  9:45 AM WH-MFC NURSE WH-MFC MFC-US  11/27/2018  9:45 AM WH-MFC [place] 5 WH-MFCUS MFC-US  12/04/2018 10:00 AM WH-MFC NURSE WH-MFC MFC-US  12/04/2018 10:00 AM WH-MFC [place] 3 WH-MFCUS MFC-US    Conan Bowens, MD Center for [facility], [facility]

## 2018-11-20 NOTE — Discharge Instructions (Signed)
Preterm Labor and Birth Information °Pregnancy normally lasts 39-41 weeks. Preterm labor is when labor starts early. It starts before you have been pregnant for 37 whole weeks. °What are the risk factors for preterm labor? °Preterm labor is more likely to occur in women who: °· Have an infection while pregnant. °· Have a cervix that is short. °· Have gone into preterm labor before. °· Have had surgery on their cervix. °· Are younger than age 36. °· Are older than age 35. °· Are African American. °· Are pregnant with two or more babies. °· Take street drugs while pregnant. °· Smoke while pregnant. °· Do not gain enough weight while pregnant. °· Got pregnant right after another pregnancy. °What are the symptoms of preterm labor? °Symptoms of preterm labor include: °· Cramps. The cramps may feel like the cramps some women get during their period. The cramps may happen with watery poop (diarrhea). °· Pain in the belly (abdomen). °· Pain in the lower back. °· Regular contractions or tightening. It may feel like your belly is getting tighter. °· Pressure in the lower belly that seems to get stronger. °· More fluid (discharge) leaking from the vagina. The fluid may be watery or bloody. °· Water breaking. °Why is it important to notice signs of preterm labor? °Babies who are born early may not be fully developed. They have a higher chance for: °· Long-term heart problems. °· Long-term lung problems. °· Trouble controlling body systems, like breathing. °· Bleeding in the brain. °· A condition called cerebral palsy. °· Learning difficulties. °· Death. °These risks are highest for babies who are born before 34 weeks of pregnancy. °How is preterm labor treated? °Treatment depends on: °· How long you were pregnant. °· Your condition. °· The health of your baby. °Treatment may involve: °· Having a stitch (suture) placed in your cervix. When you give birth, your cervix opens so the baby can come out. The stitch keeps the cervix  from opening too soon. °· Staying at the hospital. °· Taking or getting medicines, such as: °? Hormone medicines. °? Medicines to stop contractions. °? Medicines to help the baby’s lungs develop. °? Medicines to prevent your baby from having cerebral palsy. °What should I do if I am in preterm labor? °If you think you are going into labor too soon, call your doctor right away. °How can I prevent preterm labor? °· Do not use any tobacco products. °? Examples of these are cigarettes, chewing tobacco, and e-cigarettes. °? If you need help quitting, ask your doctor. °· Do not use street drugs. °· Do not use any medicines unless you ask your doctor if they are safe for you. °· Talk with your doctor before taking any herbal supplements. °· Make sure you gain enough weight. °· Watch for infection. If you think you might have an infection, get it checked right away. °· If you have gone into preterm labor before, tell your doctor. °This information is not intended to replace advice given to you by your health care provider. Make sure you discuss any questions you have with your health care provider. °Document Released: 09/07/2008 Document Revised: 11/22/2015 Document Reviewed: 11/02/2015 °Elsevier Interactive Patient Education © 2019 [employer] ° °

## 2018-11-20 NOTE — Progress Notes (Signed)
I connected with  Anna King on 11/20/18 at  3:35 PM EDT by telephone and verified that I am speaking with the correct person using two identifiers.   I discussed the limitations, risks, security and privacy concerns of performing an evaluation and management service by telephone and the availability of in person appointments. I also discussed with the patient that there may be a patient responsible charge related to this service. The patient expressed understanding and agreed to proceed.  Marylynn Pearson, RN 11/20/2018  3:48 PM

## 2018-11-21 ENCOUNTER — Inpatient Hospital Stay (HOSPITAL_COMMUNITY)
Admission: AD | Admit: 2018-11-21 | Discharge: 2018-11-23 | DRG: 776 | Disposition: A | Discharge status: Home / Self Care | Payer: Medicare Other | Attending: Obstetrics and Gynecology | Admitting: Obstetrics and Gynecology

## 2018-11-21 ENCOUNTER — Encounter (HOSPITAL_COMMUNITY): Payer: Self-pay

## 2018-11-21 DIAGNOSIS — O9081 Anemia of the puerperium: Secondary | ICD-10-CM | POA: Diagnosis present

## 2018-11-21 DIAGNOSIS — O1003 Pre-existing essential hypertension complicating the puerperium: Principal | ICD-10-CM | POA: Diagnosis present

## 2018-11-21 DIAGNOSIS — O99215 Obesity complicating the puerperium: Secondary | ICD-10-CM | POA: Diagnosis present

## 2018-11-21 DIAGNOSIS — Z30430 Encounter for insertion of intrauterine contraceptive device: Secondary | ICD-10-CM

## 2018-11-21 LAB — TYPE AND SCREEN
ABO/RH(D): O POS
Antibody Screen: NEGATIVE

## 2018-11-21 LAB — CBC
HCT: 30 % — ABNORMAL LOW (ref 36.0–46.0)
Hemoglobin: 9.6 g/dL — ABNORMAL LOW (ref 12.0–15.0)
MCH: 26.3 pg (ref 26.0–34.0)
MCHC: 32 g/dL (ref 30.0–36.0)
MCV: 82.2 fL (ref 80.0–100.0)
Platelets: 266 10*3/uL (ref 150–400)
RBC: 3.65 MIL/uL — ABNORMAL LOW (ref 3.87–5.11)
RDW: 15.7 % — ABNORMAL HIGH (ref 11.5–15.5)
WBC: 19.4 10*3/uL — ABNORMAL HIGH (ref 4.0–10.5)
nRBC: 0 % (ref 0.0–0.2)

## 2018-11-21 MED ORDER — ZOLPIDEM TARTRATE 5 MG PO TABS: 5.0000 mg | ORAL_TABLET | Freq: Every evening | ORAL | Status: DC | PRN

## 2018-11-21 MED ORDER — OXYCODONE-ACETAMINOPHEN 5-325 MG PO TABS: 1.0000 | ORAL_TABLET | ORAL | Status: DC | PRN

## 2018-11-21 MED ORDER — OXYTOCIN BOLUS FROM INFUSION: 500.0000 mL | Freq: Once | INTRAVENOUS | Status: DC

## 2018-11-21 MED ORDER — LIDOCAINE HCL (PF) 1 % IJ SOLN
30.0000 mL | INTRAMUSCULAR | Status: DC | PRN
  Filled 2018-11-21 (×2): qty 30

## 2018-11-21 MED ORDER — SOD CITRATE-CITRIC ACID 500-334 MG/5ML PO SOLN: 30.0000 mL | ORAL | Status: DC | PRN

## 2018-11-21 MED ORDER — LACTATED RINGERS IV SOLN: INTRAVENOUS | Status: DC

## 2018-11-21 MED ORDER — METHYLERGONOVINE MALEATE 0.2 MG/ML IJ SOLN: 0.2000 mg | INTRAMUSCULAR | Status: DC | PRN

## 2018-11-21 MED ORDER — PRENATAL MULTIVITAMIN CH
1.0000 | ORAL_TABLET | Freq: Every day | ORAL | Status: DC
  Administered 2018-11-21 – 2018-11-23 (×3): 1 via ORAL
  Filled 2018-11-21 (×3): qty 1

## 2018-11-21 MED ORDER — COCONUT OIL OIL: 1.0000 "application " | TOPICAL_OIL | Status: DC | PRN

## 2018-11-21 MED ORDER — OXYTOCIN 10 UNIT/ML IJ SOLN
INTRAMUSCULAR | Status: AC
  Filled 2018-11-21: qty 2

## 2018-11-21 MED ORDER — FLEET ENEMA 7-19 GM/118ML RE ENEM: 1.0000 | ENEMA | Freq: Every day | RECTAL | Status: DC | PRN

## 2018-11-21 MED ORDER — OXYCODONE-ACETAMINOPHEN 5-325 MG PO TABS
2.0000 | ORAL_TABLET | ORAL | Status: DC | PRN
  Administered 2018-11-21: 2 via ORAL
  Filled 2018-11-21: qty 2

## 2018-11-21 MED ORDER — MISOPROSTOL 200 MCG PO TABS
ORAL_TABLET | ORAL | Status: AC
  Filled 2018-11-21: qty 4

## 2018-11-21 MED ORDER — OXYTOCIN 40 UNITS IN NORMAL SALINE INFUSION - SIMPLE MED
1.0000 m[IU]/min | Freq: Once | INTRAVENOUS | Status: AC
  Administered 2018-11-21: 08:00:00 83.333 m[IU]/min via INTRAVENOUS

## 2018-11-21 MED ORDER — MISOPROSTOL 200 MCG PO TABS
800.0000 ug | ORAL_TABLET | Freq: Once | ORAL | Status: AC
  Administered 2018-11-21: 800 ug via RECTAL

## 2018-11-21 MED ORDER — IBUPROFEN 600 MG PO TABS
600.0000 mg | ORAL_TABLET | Freq: Four times a day (QID) | ORAL | Status: DC
  Administered 2018-11-21 – 2018-11-23 (×9): 600 mg via ORAL
  Filled 2018-11-21 (×9): qty 1

## 2018-11-21 MED ORDER — FENTANYL CITRATE (PF) 100 MCG/2ML IJ SOLN
100.0000 ug | Freq: Once | INTRAMUSCULAR | Status: AC
  Administered 2018-11-21: 07:00:00 100 ug via INTRAVENOUS

## 2018-11-21 MED ORDER — SIMETHICONE 80 MG PO CHEW: 80.0000 mg | CHEWABLE_TABLET | ORAL | Status: DC | PRN

## 2018-11-21 MED ORDER — WITCH HAZEL-GLYCERIN EX PADS: 1.0000 "application " | MEDICATED_PAD | CUTANEOUS | Status: DC | PRN

## 2018-11-21 MED ORDER — ONDANSETRON HCL 4 MG PO TABS: 4.0000 mg | ORAL_TABLET | ORAL | Status: DC | PRN

## 2018-11-21 MED ORDER — OXYTOCIN 40 UNITS IN NORMAL SALINE INFUSION - SIMPLE MED: 2.5000 [IU]/h | INTRAVENOUS | Status: DC

## 2018-11-21 MED ORDER — ACETAMINOPHEN 325 MG PO TABS
650.0000 mg | ORAL_TABLET | ORAL | Status: DC | PRN
  Administered 2018-11-21 – 2018-11-23 (×7): 650 mg via ORAL
  Filled 2018-11-21 (×7): qty 2

## 2018-11-21 MED ORDER — ONDANSETRON HCL 4 MG/2ML IJ SOLN: 4.0000 mg | INTRAMUSCULAR | Status: DC | PRN

## 2018-11-21 MED ORDER — DIBUCAINE (PERIANAL) 1 % EX OINT: 1.0000 "application " | TOPICAL_OINTMENT | CUTANEOUS | Status: DC | PRN

## 2018-11-21 MED ORDER — ACETAMINOPHEN 325 MG PO TABS: 650.0000 mg | ORAL_TABLET | ORAL | Status: DC | PRN

## 2018-11-21 MED ORDER — OXYTOCIN 40 UNITS IN NORMAL SALINE INFUSION - SIMPLE MED
1.0000 m[IU]/min | Freq: Once | INTRAVENOUS | Status: DC
  Filled 2018-11-21: qty 1000

## 2018-11-21 MED ORDER — DOCUSATE SODIUM 100 MG PO CAPS
100.0000 mg | ORAL_CAPSULE | Freq: Two times a day (BID) | ORAL | Status: DC
  Administered 2018-11-21 – 2018-11-23 (×4): 100 mg via ORAL
  Filled 2018-11-21 (×4): qty 1

## 2018-11-21 MED ORDER — TETANUS-DIPHTH-ACELL PERTUSSIS 5-2.5-18.5 LF-MCG/0.5 IM SUSP: 0.5000 mL | Freq: Once | INTRAMUSCULAR | Status: DC

## 2018-11-21 MED ORDER — LACTATED RINGERS IV SOLN: 500.0000 mL | INTRAVENOUS | Status: DC | PRN

## 2018-11-21 MED ORDER — DIPHENHYDRAMINE HCL 25 MG PO CAPS: 25.0000 mg | ORAL_CAPSULE | Freq: Four times a day (QID) | ORAL | Status: DC | PRN

## 2018-11-21 MED ORDER — FENTANYL CITRATE (PF) 100 MCG/2ML IJ SOLN
INTRAMUSCULAR | Status: AC
  Administered 2018-11-21: 100 ug via INTRAVENOUS
  Filled 2018-11-21: qty 2

## 2018-11-21 MED ORDER — OXYTOCIN 40 UNITS IN NORMAL SALINE INFUSION - SIMPLE MED: 10.0000 [IU]/h | Freq: Once | INTRAVENOUS | Status: DC

## 2018-11-21 MED ORDER — BENZOCAINE-MENTHOL 20-0.5 % EX AERO
1.0000 "application " | INHALATION_SPRAY | CUTANEOUS | Status: DC | PRN
  Administered 2018-11-21: 1 via TOPICAL
  Filled 2018-11-21: qty 56

## 2018-11-21 MED ORDER — METHYLERGONOVINE MALEATE 0.2 MG PO TABS: 0.2000 mg | ORAL_TABLET | ORAL | Status: DC | PRN

## 2018-11-21 MED ORDER — BISACODYL 10 MG RE SUPP: 10.0000 mg | Freq: Every day | RECTAL | Status: DC | PRN

## 2018-11-21 MED ORDER — ONDANSETRON HCL 4 MG/2ML IJ SOLN: 4.0000 mg | Freq: Four times a day (QID) | INTRAMUSCULAR | Status: DC | PRN

## 2018-11-21 MED ORDER — MEASLES, MUMPS & RUBELLA VAC IJ SOLR: 0.5000 mL | Freq: Once | INTRAMUSCULAR | Status: DC

## 2018-11-21 MED ORDER — FERROUS SULFATE 325 (65 FE) MG PO TABS
325.0000 mg | ORAL_TABLET | Freq: Two times a day (BID) | ORAL | Status: DC
  Administered 2018-11-21 – 2018-11-23 (×4): 325 mg via ORAL
  Filled 2018-11-21 (×4): qty 1

## 2018-11-21 NOTE — H&P (Signed)
Anna King is a 38 y.o. female G62P2002 with IUP at [redacted]w[redacted]d presenting for delivery at home.  She thought she had gas starting around 0300, got int the tub, started feeling like the baby was coming. Mom called EMS, they arrived just as the baby came out. Placenta delivered by EMS.  APpears to be intact.  [facility]  Prenatal History/Complications:  CHTN Threatened PTL, admitted 5/26-(BMZ, MgS04)5/27 after a fall  Past Medical History: Past Medical History:  Diagnosis Date  . Asthma   . BV (bacterial vaginosis)   . Chlamydia   . Hypertension     Past Surgical History: Past Surgical History:  Procedure Laterality Date  . WISDOM TOOTH EXTRACTION      Obstetrical History: OB History    Gravida  3   Para  2   Term  2   Preterm      AB      Living  2     SAB      TAB      Ectopic      Multiple      Live Births  2            Social History: Social History   Socioeconomic History  . Marital status: Single    Spouse name: Not on file  . Number of children: Not on file  . Years of education: Not on file  . Highest education level: Not on file  Occupational History  . Not on file  Social Needs  . Financial resource strain: Not on file  . Food insecurity:    Worry: Never true    Inability: Sometimes true  . Transportation needs:    Medical: Yes    Non-medical: No  Tobacco Use  . Smoking status: Never Smoker  . Smokeless tobacco: Never Used  Substance and Sexual Activity  . Alcohol use: No  . Drug use: No  . Sexual activity: Yes    Birth control/protection: None  Lifestyle  . Physical activity:    Days per week: Not on file    Minutes per session: Not on file  . Stress: Not on file  Relationships  . Social connections:    Talks on phone: Not on file    Gets together: Not on file    Attends religious service: Not on file    Active member of club or organization: Not on file    Attends meetings of clubs or organizations: Not on file   Relationship status: Not on file  Other Topics Concern  . Not on file  Social History Narrative  . Not on file    Family History: Family History  Problem Relation Age of Onset  . Diabetes Mother   . Hypertension Mother     Allergies: Allergies  Allergen Reactions  . Shellfish Allergy Anaphylaxis  . Betadine [Povidone Iodine] Rash    Itching     Medications Prior to Admission  Medication Sig Dispense Refill Last Dose  . aspirin EC 81 MG tablet Take 1 tablet (81 mg total) by mouth daily. Take after 12 weeks for prevention of preeclampsia later in pregnancy 300 tablet 2 Taking  . ferrous sulfate 325 (65 FE) MG EC tablet Take 325 mg by mouth 3 (three) times daily with meals.   Taking  . labetalol (NORMODYNE) 200 MG tablet Take 0.5 tablets (100 mg total) by mouth 2 (two) times daily. 60 tablet 3 Taking  . Prenatal Vit-Fe Fumarate-FA (PRENATAL COMPLETE) 14-0.4 MG  TABS Take 1 tablet by mouth daily. 60 each 0 Taking        Review of Systems   Constitutional: Negative for fever and chills Eyes: Negative for visual disturbances Respiratory: Negative for shortness of breath, dyspnea Cardiovascular: Negative for chest pain or palpitations  Gastrointestinal: Negative for vomiting, diarrhea and constipation.  POSITIVE for abdominal pain (cramps) Genitourinary: Negative for dysuria and urgency Musculoskeletal: Negative for back pain, joint pain, myalgias  Neurological: Negative for dizziness and headaches      Temperature 98.3 F (36.8 C), temperature source Oral, last menstrual period 04/06/2018, SpO2 99 %, unknown if currently breastfeeding. General appearance: alert, cooperative and no distress Lungs: normal respiratory effort Heart: regular rate and rhythm Abdomen: soft, non-tender; bowel sounds normal Fundus at U+2 Pelvic:  Vagina intact.  493+cc clots evacuated manually from LUS.  PItocin running IV and cytotec 800 mcg given PR Extremities: Homans sign is negative, no  sign of DVT DTR's 2+   Prenatal labs: ABO, Rh: --/--/O POS (05/26 2215) Antibody: NEG (05/26 2215) Rubella: 1.74 (01/08 1127) RPR: Non Reactive (05/26 2208)  HBsAg: Negative (01/08 1127)  HIV: Non Reactive (04/29 1538)  GBS:   unknown   Nursing Staff Provider  Office Location  [facility] Dating    Language  English Anatomy US    Flu Vaccine  declined Genetic Screen  NIPS: low risk    TDaP vaccine  10/22/18   Hgb A1C or  GTT Early  Third trimester: neg  Rhogam  n/a   LAB RESULTS   Feeding Plan breast Blood Type --/--/O POS [facility]formed at [facility], [address] [place], [place] [address]  [phone](12/02 1547)   Contraception IUD vs BTL Antibody  neg  Circumcision n/a Rubella  imm  Pediatrician  [facility] RPR   neg  Support Person [facility]   neg  Prenatal Classes  HIV Non Reactive (12/02 1538)  BTL Consent  GBS  (For PCN allergy, check sensitivities)   VBAC Consent  Pap  neg 2020    Hgb Electro  neg    CF neg    SMA neg    Waterbirth  [ ]  Class [ ]  Consent [ ]  CNM visit     Prenatal Transfer Tool  Maternal Diabetes: No Genetic Screening: Normal Maternal Ultrasounds/Referrals: Normal Fetal Ultrasounds or other Referrals:  None Maternal Substance Abuse:  No Significant Maternal Medications:  None Significant Maternal Lab Results: GBS ?     No results found for this or any previous visit (from the past 24 hour(s)).  Assessment: Anna King is a 38 y.o. [id number] with an IUP at 6966w5d presenting for postpartum care  Plan: Routine pp care Watch bleeding   Jacklyn ShellFrances Cresenzo-Dishmon 11/21/2018, 7:10 AM

## 2018-11-21 NOTE — MAU Note (Signed)
Patient presents via EMS for delivering her baby at 0545 in the bathtub at home.  She thought she was having gas and then delivered unexpectedly.  Patient received 2 doses of BMZ after a fall earlier in the pregnancy where she had dilated to 3 cm.  EMS stated she was hypotensive on the truck.  Patient has had a large amount of bleeding since delivery-placenta has also delivered.

## 2018-11-22 DIAGNOSIS — Z30430 Encounter for insertion of intrauterine contraceptive device: Secondary | ICD-10-CM

## 2018-11-22 LAB — CBC
HCT: 25.4 % — ABNORMAL LOW (ref 36.0–46.0)
Hemoglobin: 8.1 g/dL — ABNORMAL LOW (ref 12.0–15.0)
MCH: 26.4 pg (ref 26.0–34.0)
MCHC: 31.9 g/dL (ref 30.0–36.0)
MCV: 82.7 fL (ref 80.0–100.0)
Platelets: 228 10*3/uL (ref 150–400)
RBC: 3.07 MIL/uL — ABNORMAL LOW (ref 3.87–5.11)
RDW: 15.9 % — ABNORMAL HIGH (ref 11.5–15.5)
WBC: 16.3 10*3/uL — ABNORMAL HIGH (ref 4.0–10.5)
nRBC: 0.3 % — ABNORMAL HIGH (ref 0.0–0.2)

## 2018-11-22 LAB — RPR: RPR Ser Ql: NONREACTIVE

## 2018-11-22 MED ORDER — LEVONORGESTREL 19.5 MCG/DAY IU IUD
INTRAUTERINE_SYSTEM | INTRAUTERINE | Status: AC
  Filled 2018-11-22: qty 1

## 2018-11-22 MED ORDER — FERROUS SULFATE 325 (65 FE) MG PO TABS: 325.0000 mg | ORAL_TABLET | Freq: Two times a day (BID) | ORAL | Status: DC

## 2018-11-22 MED ORDER — LEVONORGESTREL 19.5 MCG/DAY IU IUD
INTRAUTERINE_SYSTEM | Freq: Once | INTRAUTERINE | Status: DC
  Filled 2018-11-22: qty 1

## 2018-11-22 NOTE — Procedures (Signed)
Post-Placental IUD Insertion Procedure Note  Pt had baby at home (accidentally) and strongly desired in hospital IUD.    Patient identified, informed consent signed prior to delivery, signed copy in chart, time out was performed.      - IUD inserted with inserter per manufacturer's instructions. Sounded to 11cms   Strings trimmed to the level of the introitus. Patient tolerated procedure well.  Lot # [id number] Expiration Date01/2024 Patient given post procedure instructions and IUD care card with expiration date.  Patient is asked to keep IUD strings tucked in her vagina until her postpartum follow up visit in 4-6 weeks. Patient advised to abstain from sexual intercourse and pulling on strings before her follow-up visit. Patient verbalized an understanding of the plan of care and agrees.

## 2018-11-22 NOTE — Lactation Note (Signed)
This note was copied from a baby's chart. Lactation Consultation Note  Patient Name: Anna King [id number] Date: 11/22/2018   Attempted to visit with mom twice, at 2:25 pm and just now. Mom is still in the shower. LC to come back later to do Lactation assessment, mom has a baby in NICU and DEBP has already been set up in the room.  Maternal Data    Feeding Feeding Type: Donor Breast Milk   Interventions    Lactation Tools Discussed/Used     Consult Status      Anna King 11/22/2018, 2:53 PM

## 2018-11-22 NOTE — Lactation Note (Signed)
This note was copied from a baby's chart. Lactation Consultation Note  Patient Name: Girl Raivyn Barbot LPNPY'Y Date: 11/22/2018 Reason for consult: Initial assessment;NICU baby;Preterm <34wks;Infant < 6lbs;1st time breastfeeding  Visited with mom of 34 hours old pre-term < 6 lbs female in NICU, she just started pumping this morning. Mom is a P3 but this is her first time BF, she didn't BF her other kids. She participated in the [facility] program [facility] the [facility] but she's not familiar with hand expression. LC offered to teach her but mom politely declined a hands on, she was having lunch, but asked LC for instructions on how to do it, discussed the "C" hold and asked mom to call for assistance when needed.  Mom hasn't pumped since the morning, she said she had to pump for an hour to get 5 ml of colostrum that has already taken to the NICU. Praised her for her efforts but also let her know that prolonged pumping is not necessary and frequent pumping is actually more effective, she voiced understanding. She left discouraged in her last pumping session because "nothing came out". Explained to mom that the purpose of pumping early on is mainly for breast stimulation and not to get volume. RN Romana Juniper was very proactive and brought some coconut oil, mom was instructed on how to use it. Reviewed milk storage guidelines, pumping log and benefits of premature milk.   Feeding plan:  1. Encouraged mom to pump every 2-3 hours during the day and at least once at night. Mom wasn't thrilled about pumping frequency but let her know that any pumping is better than no pumping 2. She'll use coconut oil prior pumping 3. Mom will continue taking her EBM to the NICU following milk storage guidelines for NICU babies  BF brochure, BF resources and NICU booklet were reviewed. Parents reported all questions and concerns were answered, they're both aware of LC services and will call PRN.   Maternal Data Formula Feeding for Exclusion:  No Has patient been taught Hand Expression?: Yes Does the patient have breastfeeding experience prior to this delivery?: No(Mom didn't BF her other kids, this is her first time BF)  Feeding Feeding Type: Donor Breast Milk   Interventions Interventions: Breast feeding basics reviewed;Coconut oil;DEBP  Lactation Tools Discussed/Used Tools: Pump;Coconut oil Breast pump type: Double-Electric Breast Pump WIC Program: Yes Pump Review: Setup, frequency, and cleaning;Milk Storage Initiated by:: RN and MPeck Date initiated:: 11/22/18   Consult Status Consult Status: PRN Date: 11/23/18 Follow-up type: In-patient    Elvia Aydin Venetia Constable 11/22/2018, 3:55 PM

## 2018-11-22 NOTE — Discharge Instructions (Signed)
Intrauterine Device Insertion, Care After    This sheet gives you information about how to care for yourself after your procedure. Your health care provider may also give you more specific instructions. If you have problems or questions, contact your health care provider.  What can I expect after the procedure?  After the procedure, it is common to have:  · Cramps and pain in the abdomen.  · Light bleeding (spotting) or heavier bleeding that is like your menstrual period. This may last for up to a few days.  · Lower back pain.  · Dizziness.  · Headaches.  · Nausea.  Follow these instructions at home:  · Before resuming sexual activity, check to make sure that you can feel the IUD string(s). You should be able to feel the end of the string(s) below the opening of your cervix. If your IUD string is in place, you may resume sexual activity.  ? If you had a hormonal IUD inserted more than 7 days after your most recent period started, you will need to use a backup method of birth control for 7 days after IUD insertion. Ask your health care provider whether this applies to you.  · Continue to check that the IUD is still in place by feeling for the string(s) after every menstrual period, or once a month.  · Take over-the-counter and prescription medicines only as told by your health care provider.  · Do not drive or use heavy machinery while taking prescription pain medicine.  · Keep all follow-up visits as told by your health care provider. This is important.  Contact a health care provider if:  · You have bleeding that is heavier or lasts longer than a normal menstrual cycle.  · You have a fever.  · You have cramps or abdominal pain that get worse or do not get better with medicine.  · You develop abdominal pain that is new or is not in the same area of earlier cramping and pain.  · You feel lightheaded or weak.  · You have abnormal or bad-smelling discharge from your vagina.  · You have pain during sexual  activity.  · You have any of the following problems with your IUD string(s):  ? The string bothers or hurts you or your sexual partner.  ? You cannot feel the string.  ? The string has gotten longer.  · You can feel the IUD in your vagina.  · You think you may be pregnant, or you miss your menstrual period.  · You think you may have an STI (sexually transmitted infection).  Get help right away if:  · You have flu-like symptoms.  · You have a fever and chills.  · You can feel that your IUD has slipped out of place.  Summary  · After the procedure, it is common to have cramps and pain in the abdomen. It is also common to have light bleeding (spotting) or heavier bleeding that is like your menstrual period.  · Continue to check that the IUD is still in place by feeling for the string(s) after every menstrual period, or once a month.  · Keep all follow-up visits as told by your health care provider. This is important.  · Contact your health care provider if you have problems with your IUD string(s), such as the string getting longer or bothering you or your sexual partner.  This information is not intended to replace advice given to you by your health care provider. Make   sure you discuss any questions you have with your health care provider.  Document Released: 02/07/2011 Document Revised: 05/02/2016 Document Reviewed: 05/02/2016  Elsevier Interactive Patient Education © 2019 Elsevier Inc.

## 2018-11-22 NOTE — Progress Notes (Signed)
POSTPARTUM PROGRESS NOTE  Post Partum Day 1  Subjective:  Anna King is a 38 y.o. [id number] s/p SVD at [redacted]w[redacted]d.  No acute events overnight.  Pt denies problems with ambulating, voiding or po intake.  She denies nausea or vomiting.  Pain is well controlled.  She has had flatus. Lochia Small.   Objective: Blood pressure 130/70, pulse 61, temperature 97.8 F (36.6 C), temperature source Oral, resp. rate 18, last menstrual period 04/06/2018, SpO2 100 %, unknown if currently breastfeeding.  Physical Exam:  General: alert, cooperative and no distress Chest: no respiratory distress Heart:regular rate, distal pulses intact Abdomen: soft, nontender,  Uterine Fundus: firm, appropriately tender DVT Evaluation: No calf swelling or tenderness Extremities: 1+ edema Skin: warm, dry  Recent Labs    11/21/18 0856 11/22/18 0531  HGB 9.6* 8.1*  HCT 30.0* 25.4*    Assessment/Plan: Anna King is a 38 y.o. I5O2774 s/p SVD at [redacted]w[redacted]d. Patient delivery in route with EMS. Baby in NICU  PPD# 1 - Doing well Contraception: PP IUD. Patient requests to have it placed prior to leaving the hospital.  Feeding: Breast Dispo: Plan for discharge tomorrow. # Anemia Hgb 9.6 > 8.1. BID iron started    LOS: 1 day    Venia Carbon I, NP 11/22/2018, 8:51 AM

## 2018-11-22 NOTE — Discharge Summary (Signed)
Postpartum Discharge Summary     Patient Name: Anna King DOB: March 21, 1981 MRN: [id number]  Date of admission: 11/21/2018 Delivering Provider:   None (delivered at home)  Date of discharge: 11/23/2018  Admitting diagnosis: DELIVERED Intrauterine pregnancy: [redacted]w[redacted]d     Secondary diagnosis:  Active Problems:   Vaginal delivery   Postpartum care following vaginal delivery   Encounter for IUD insertion  Additional problems: morbid obesity, chronic HTN     Discharge diagnosis: Preterm Pregnancy Delivered                                                                                                Post partum procedures:Liletta IUD placement  Augmentation: n/a  Complications: Hemorrhage>1031mL  Hospital course:  Delivered at home, placenta in ambulance. Admitted and found to have lost >1000cc blood, 500cc of clots removed, meds given and bleeding stabilized.  Hgb 11.3>8.1, asymptomatic.  Liletta IUD placed 5/30 w/o complications. Baby doing well, in NICU.   Magnesium Sulfate recieved: No BMZ received: Yes  Physical exam  Vitals:   11/22/18 0546 11/22/18 1415 11/23/18 0000 11/23/18 0504  BP: 130/70 115/73 124/81 (!) 96/52  Pulse: 61 69 76 67  Resp: 18 17 16 16   Temp: 97.8 F (36.6 C) 97.8 F (36.6 C) 97.9 F (36.6 C) 98.4 F (36.9 C)  TempSrc: Oral Oral Oral Oral  SpO2: 100% 100%     General: alert, cooperative and no distress Lochia: appropriate Uterine Fundus: firm Incision: N/A DVT Evaluation: No evidence of DVT seen on physical exam. Negative Homan's sign. No cords or calf tenderness. No significant calf/ankle edema. Labs: Lab Results  Component Value Date   WBC 16.3 (H) 11/22/2018   HGB 8.1 (L) 11/22/2018   HCT 25.4 (L) 11/22/2018   MCV 82.7 11/22/2018   PLT 228 11/22/2018   CMP Latest Ref Rng & Units 07/02/2018  Glucose 65 - 99 mg/dL 78  BUN 6 - 20 mg/dL 8  Creatinine 1.91 - 6.60 mg/dL 6.00  Sodium 459 - 977 mmol/L 139  Potassium 3.5 - 5.2 mmol/L  4.0  Chloride 96 - 106 mmol/L 105  CO2 20 - 29 mmol/L 21  Calcium 8.7 - 10.2 mg/dL 9.3  Total Protein 6.0 - 8.5 g/dL 6.7  Total Bilirubin 0.0 - 1.2 mg/dL 0.3  Alkaline Phos 39 - 117 IU/L 64  AST 0 - 40 IU/L 11  ALT 0 - 32 IU/L 8    Discharge instruction: per After Visit Summary and "Baby and Me Booklet".  After visit meds:  Allergies as of 11/23/2018      Reactions   Shellfish Allergy Anaphylaxis   Betadine [povidone Iodine] Rash   Itching      Medication List    STOP taking these medications   aspirin EC 81 MG tablet   ferrous sulfate 325 (65 FE) MG EC tablet   labetalol 200 MG tablet Commonly known as:  NORMODYNE     TAKE these medications   ibuprofen 600 MG tablet Commonly known as:  ADVIL Take 1 tablet (600 mg total) by mouth every 6 (six) hours.  Prenatal Complete 14-0.4 MG Tabs Take 1 tablet by mouth daily.       Diet: routine diet  Activity: Advance as tolerated. Pelvic rest for 6 weeks.    Follow up Appt: Future Appointments  Date Time Provider Department Center  11/27/2018  9:45 AM [facility] NURSE [facility] M[facility]-US  11/27/2018  9:45 AM [facility] US 5 [facility]-M[facility]US MFC-US  12/04/2018 10:00 AM [facility] NURSE [facility] MFC-US  12/04/2018 10:00 AM [facility] US 3 WH-MFCUS MFC-US  12/08/2018  1:15 PM Adam PhenixArnold, James G, MD [facility] WOC   Follow up Visit: Follow-up Information    [facility]. Schedule an appointment as soon as possible for a visit in 1 week(s).   Specialty:  Obstetrics and Gynecology Why:  for virtual BP check and 4 weeks for postpartum checkup (and IUD check) Contact information: [address], [address] [id number] [place] [phone]           Please schedule this patient for Postpartum visit in: 4 weeks with the following provider: Any provider For C/S patients schedule nurse incision check in weeks 2 weeks:  High risk pregnancy complicated by: HTN Delivery mode:   SVD Anticipated Birth Control:  IUD placed while inpt PP Procedures needed: BP check (can do virtual visit) Schedule Integrated BH visit: no      Newborn Data: Live born female  Birth Weight: 4 lb 3.7 oz (1920 g) APGAR: ,9/9  Newborn Delivery   Birth date/time:  11/21/2018 05:45:00 Delivery type:  Vaginal, Spontaneous     Baby Feeding: Breast Disposition:NICU   11/23/2018 Jacklyn ShellFrances Cresenzo-Dishmon, CNM

## 2018-11-23 MED ORDER — IBUPROFEN 600 MG PO TABS: 600.0000 mg | ORAL_TABLET | Freq: Four times a day (QID) | ORAL | 0 refills | Status: DC

## 2018-11-23 NOTE — Lactation Note (Signed)
This note was copied from a baby's chart. Lactation Consultation Note  Patient Name: Anna King [id number] Date: 11/23/2018 Reason for consult: Follow-up assessment;Preterm <34wks;1st time breastfeeding;NICU baby  Visited with P3 Mom of baby in the NICU.  Baby 51 hrs old.  Mom was pumping at side of bed, and able to express colostrum to collect in colostrum container for baby.  About 5 ml expressed and Mom is very pleased.  When Mom was pumping, noted that nipples were compressed within flange.  Switched to 27 mm flange size for a better fit.  Mom using coconut oil prior to pumping.  No nipple trauma noted.    Mom interested in obtaining a [facility] loaner pump as she will be discharged today.  Mom aware of Symphony pump in the baby's room for her use, and also showed her and FOB how to reassemble pump parts for a manual double pump.  Paperwork given and Mom will call back when she has the exact change of $30.   Reviewed importance of disassembling pump parts and washing, rinsing and air drying in separate basin.   Engorgement prevention and treatment reviewed.  Mom knows to call prn for concerns.  Interventions Interventions: Breast feeding basics reviewed;Skin to skin;Breast massage;Hand express;Coconut oil;DEBP  Lactation Tools Discussed/Used Tools: Pump;Coconut oil Breast pump type: Double-Electric Breast Pump   Consult Status Consult Status: Complete Date: 11/23/18 Follow-up type: Call as needed    [name] 11/23/2018, 8:49 AM

## 2018-11-25 NOTE — Progress Notes (Signed)
Patient screened out for psychosocial assessment since none of the following apply: °Psychosocial stressors documented in mother or baby's chart °Gestation less than 32 weeks °Code at delivery  °Infant with anomalies °Please contact the Clinical Social Worker if specific needs arise, by MOB's request, or if MOB scores greater than 9/yes to question 10 on Edinburgh Postpartum Depression Screen. ° °Kenndra Morris Boyd-Gilyard, MSW, LCSW °Clinical Social Work °[phone] °  °

## 2018-11-27 ENCOUNTER — Ambulatory Visit (HOSPITAL_COMMUNITY): Payer: Medicare Other

## 2018-12-04 ENCOUNTER — Telehealth: Payer: Self-pay | Admitting: Family Medicine

## 2018-12-04 ENCOUNTER — Ambulatory Visit (HOSPITAL_COMMUNITY): Payer: Medicare Other

## 2018-12-04 NOTE — Telephone Encounter (Signed)
Attempted to call patient about virtual bp check on 6/12 @ 8:30. No answer left a voicemail instructing patient to make sure she has the [employer] before her appointment. Patient was not screened for covid symptoms due to it being a virtual appointment.

## 2018-12-05 ENCOUNTER — Telehealth (INDEPENDENT_AMBULATORY_CARE_PROVIDER_SITE_OTHER): Payer: Medicare Other | Admitting: *Deleted

## 2018-12-05 ENCOUNTER — Other Ambulatory Visit: Payer: Self-pay

## 2018-12-05 DIAGNOSIS — O10919 Unspecified pre-existing hypertension complicating pregnancy, unspecified trimester: Secondary | ICD-10-CM

## 2018-12-05 NOTE — Progress Notes (Signed)
I connected with  Anna King on 12/05/18 at 11:40 AM by telephone and verified that I am speaking with the correct person using two identifiers.   I discussed the limitations, risks, security and privacy concerns of performing an evaluation and management service by telephone and the availability of in person appointments. I also discussed with the patient that there may be a patient responsible charge related to this service. The patient expressed understanding and agreed to proceed.  Pt has had great difficulty today with trying to access MyChart App for video visit so visit was completed by telephone. Pt states she last took BP 2 days ago and the result was 152/83. She had a H/A at the time. She also reports H/A yesterday however did not check BP. She took ibuprofen 600 mg yesterday with relief of H/A for several hours but then it returned. She denies H/A or visual disturbances today.  I asked pt to sit quietly for 5 minutes now, then take BP and I will call her back for the result. Pt agreed.    1155  Called pt back and she reported BP 116/89. She has been taking BP in lower arm due to size of her upper arm however has not been raising lower arm to chest level. I asked pt to re-check BP following my instructions. She then reported BP 91/37. Pt was advised of all the reasons she should need to go to MAU and voiced understanding. Since she had IUD placed following delivery of baby and needs strings checked and cut, pt's next appt will be changed to face to face visit in office. She will be notified of date and time.   Day, Ronnell Freshwater, RN 12/05/2018  11:44 AM

## 2018-12-08 ENCOUNTER — Encounter: Payer: Medicare Other | Admitting: Obstetrics & Gynecology

## 2018-12-08 NOTE — Progress Notes (Signed)
I have reviewed this chart and agree with the RN/CMA assessment and management.    Kidus Delman C Deina Lipsey, MD, FACOG Attending Physician, [facility]  

## 2018-12-23 ENCOUNTER — Telehealth: Payer: Medicare Other | Admitting: Advanced Practice Midwife

## 2018-12-23 ENCOUNTER — Telehealth: Payer: Self-pay | Admitting: Advanced Practice Midwife

## 2018-12-23 NOTE — Telephone Encounter (Signed)
Called the patient to complete the pre-screen. The patient answered no to COVID19 symptoms and/or being previously diagnosed. Informed the patient of the wearing a face mask, sanitizing hands at the sanitizing station upon entering our office, and no visitors or children are allowed due to the COVID19 restrictions. The patient verbalized understanding. °

## 2018-12-24 ENCOUNTER — Other Ambulatory Visit: Payer: Self-pay

## 2018-12-24 ENCOUNTER — Ambulatory Visit (INDEPENDENT_AMBULATORY_CARE_PROVIDER_SITE_OTHER): Payer: Medicare Other | Admitting: Obstetrics & Gynecology

## 2018-12-24 ENCOUNTER — Encounter: Payer: Self-pay | Admitting: Obstetrics & Gynecology

## 2018-12-24 DIAGNOSIS — Z1389 Encounter for screening for other disorder: Secondary | ICD-10-CM | POA: Diagnosis not present

## 2018-12-24 DIAGNOSIS — I10 Essential (primary) hypertension: Secondary | ICD-10-CM

## 2018-12-24 NOTE — Patient Instructions (Signed)
Return to clinic for any scheduled appointments or for any gynecologic concerns as needed.   

## 2018-12-24 NOTE — Progress Notes (Signed)
Subjective:     Anna King is a 38 y.o. [phone] female who presents for a postpartum visit. She is 6 weeks postpartum following a SVD I have fully reviewed the prenatal and intrapartum course. The delivery was at [redacted]w[redacted]d gestational weeks at home. Outcome: spontaneous vaginal delivery. Anesthesia: none. Postpartum course has been unremarkable; not taking anything for her CHTN. Baby's course has been unremarkable. Baby is feeding by bottle - gerber goodstart. Bleeding no bleeding. Bowel function is normal. Bladder function is normal. Patient is sexually active. Contraception method is IUD, Anna King was placed postplacentally. Postpartum depression screening: negative.  The following portions of the patient's history were reviewed and updated as appropriate: allergies, current medications, past family history, past medical history, past social history, past surgical history and problem list. Normal pap in 07/02/2018  Review of Systems Pertinent items noted in HPI and remainder of comprehensive ROS otherwise negative.   Objective:    BP 135/88   Pulse 69   Wt (!) 337 lb 9.6 oz (153.1 kg)   BMI 54.49 kg/m   General:  alert and no distress   Breasts:  inspection negative, no nipple discharge or bleeding, no masses or nodularity palpable  Lungs: clear to auscultation bilaterally  Heart:  regular rate and rhythm  Abdomen: soft, non-tender; bowel sounds normal; no masses,  no organomegaly   Vulva:  normal  Vagina: normal vagina, no discharge, exudate, lesion, or erythema  Cervix:  multiparous appearance. IUD strings seen and were long, cut to 2 cm in length and this tucked nicely into the multiparous os folds.  Corpus: normal size, contour, position, consistency, mobility, non-tender  Adnexa:  not evaluated  Rectal Exam: Not performed.        Assessment:     Normal postpartum exam. Stable BP.   Plan:    1. Contraception: IUD in place, doing well.  Will keep in place for 7 years but can be  removed earlier if needed. 2. Follow up with PCP for CHTN 3. Follow up as needed.    Anna Schneiders, MD, Anna King for [employer], [place]

## 2019-02-28 ENCOUNTER — Ambulatory Visit (HOSPITAL_COMMUNITY)
Admission: EM | Admit: 2019-02-28 | Discharge: 2019-02-28 | Disposition: A | Discharge status: Home / Self Care | Payer: Medicare Other | Attending: Emergency Medicine | Admitting: Emergency Medicine

## 2019-02-28 ENCOUNTER — Other Ambulatory Visit: Payer: Self-pay

## 2019-02-28 ENCOUNTER — Encounter (HOSPITAL_COMMUNITY): Payer: Self-pay

## 2019-02-28 DIAGNOSIS — N898 Other specified noninflammatory disorders of vagina: Secondary | ICD-10-CM | POA: Diagnosis present

## 2019-02-28 DIAGNOSIS — I10 Essential (primary) hypertension: Secondary | ICD-10-CM | POA: Diagnosis not present

## 2019-02-28 DIAGNOSIS — Z202 Contact with and (suspected) exposure to infections with a predominantly sexual mode of transmission: Secondary | ICD-10-CM | POA: Diagnosis present

## 2019-02-28 DIAGNOSIS — Z113 Encounter for screening for infections with a predominantly sexual mode of transmission: Secondary | ICD-10-CM | POA: Diagnosis not present

## 2019-02-28 MED ORDER — AZITHROMYCIN 250 MG PO TABS
ORAL_TABLET | ORAL | Status: AC
  Filled 2019-02-28: qty 4

## 2019-02-28 MED ORDER — AZITHROMYCIN 250 MG PO TABS
1000.0000 mg | ORAL_TABLET | Freq: Once | ORAL | Status: AC
  Administered 2019-02-28: 1000 mg via ORAL

## 2019-02-28 MED ORDER — CEFTRIAXONE SODIUM 250 MG IJ SOLR
INTRAMUSCULAR | Status: AC
  Filled 2019-02-28: qty 250

## 2019-02-28 MED ORDER — CEFTRIAXONE SODIUM 250 MG IJ SOLR
250.0000 mg | Freq: Once | INTRAMUSCULAR | Status: AC
  Administered 2019-02-28: 250 mg via INTRAMUSCULAR

## 2019-02-28 NOTE — ED Triage Notes (Signed)
Pt present she has been exposed to STD, Gonorrhea. Pt denies any symptoms.

## 2019-02-28 NOTE — Discharge Instructions (Signed)
We have treated you today for gonorrhea and chlamydia.  °Will notify you of any positive findings and if any changes to treatment are needed. You may monitor your results on your MyChart online as well.  °Please withhold from intercourse for the next week. °Please use condoms to prevent STD's.   ° °

## 2019-02-28 NOTE — ED Provider Notes (Signed)
[facility]    CSN: [id number] Arrival date & time: 02/28/19  1606      History   Chief Complaint Chief Complaint  Patient presents with  . Exposure to STD    HPI Anna NuttingLatisha F King is a 38 y.o. female.   Anna King presents with complaints of vaginal discharge. A partner notified her today that he tested positive for gonorrhea. They do not use condoms. No pelvic pain. No vaginal bleeding. Has had some itching. No back pain. No urinary symptoms. Has had STD in the past but has been years. She is concerned about any other potential std exposure, requesting blood testing as well.    ROS per HPI, negative if not otherwise mentioned.      Past Medical History:  Diagnosis Date  . Asthma   . BV (bacterial vaginosis)   . Chlamydia   . Hypertension     Patient Active Problem List   Diagnosis Date Noted  . BMI 50.0-59.9, adult (HCC) 10/22/2018  . Essential hypertension 02/03/2018    Past Surgical History:  Procedure Laterality Date  . WISDOM TOOTH EXTRACTION      OB History    Gravida  3   Para  3   Term  2   Preterm  1   AB      Living  3     SAB      TAB      Ectopic      Multiple      Live Births  3            Home Medications    Prior to Admission medications   Medication Sig Start Date End Date Taking? Authorizing Provider  ibuprofen (ADVIL) 600 MG tablet Take 1 tablet (600 mg total) by mouth every 6 (six) hours. 11/23/18   Cresenzo-Dishmon, Scarlette CalicoFrances, CNM  Prenatal Vit-Fe Fumarate-FA (PRENATAL COMPLETE) 14-0.4 MG TABS Take 1 tablet by mouth daily. 05/12/18   Georgetta HaberBurky,  B, NP    Family History Family History  Problem Relation Age of Onset  . Diabetes Mother   . Hypertension Mother     Social History Social History   Tobacco Use  . Smoking status: Never Smoker  . Smokeless tobacco: Never Used  Substance Use Topics  . Alcohol use: No  . Drug use: No     Allergies   Shellfish allergy and Betadine [povidone  iodine]   Review of Systems Review of Systems   Physical Exam Triage Vital Signs ED Triage Vitals  Enc Vitals Group     BP 02/28/19 1621 (!) 139/104     Pulse Rate 02/28/19 1621 77     Resp 02/28/19 1621 16     Temp 02/28/19 1621 98.7 F (37.1 C)     Temp Source 02/28/19 1621 Oral     SpO2 02/28/19 1621 100 %     Weight --      Height --      Head Circumference --      Peak Flow --      Pain Score 02/28/19 1623 4     Pain Loc --      Pain Edu? --      Excl. in GC? --    No data found.  Updated Vital Signs BP (!) 139/104 (BP Location: Left Arm)   Pulse 77   Temp 98.7 F (37.1 C) (Oral)   Resp 16   LMP 02/24/2019   SpO2 100%  Physical Exam Constitutional:      General: She is not in acute distress.    Appearance: She is well-developed.  Cardiovascular:     Rate and Rhythm: Normal rate.  Pulmonary:     Effort: Pulmonary effort is normal.  Abdominal:     Palpations: Abdomen is soft. Abdomen is not rigid.     Tenderness: There is no abdominal tenderness. There is no guarding or rebound.  Genitourinary:    Comments: Denies sores, lesions, vaginal bleeding; no pelvic pain; gu exam deferred at this time, vaginal self swab collected.   Skin:    General: Skin is warm and dry.  Neurological:     Mental Status: She is alert and oriented to person, place, and time.      UC Treatments / Results  Labs (all labs ordered are listed, but only abnormal results are displayed) Labs Reviewed  HIV ANTIBODY (ROUTINE TESTING W REFLEX)  RPR  CERVICOVAGINAL ANCILLARY ONLY    EKG   Radiology No results found.  Procedures Procedures (including critical care time)  Medications Ordered in UC Medications  azithromycin (ZITHROMAX) tablet 1,000 mg (has no administration in time range)  cefTRIAXone (ROCEPHIN) injection 250 mg (has no administration in time range)    Initial Impression / Assessment and Plan / UC Course  I have reviewed the triage vital signs and the  nursing notes.  Pertinent labs & imaging results that were available during my care of the patient were reviewed by me and considered in my medical decision making (see chart for details).     Empiric treatment for gonorrhea administered today. Std testing pending. Will notify of any positive findings and if any changes to treatment are needed.  Safe sex encouraged. Patient verbalized understanding and agreeable to plan.   Final Clinical Impressions(s) / UC Diagnoses   Final diagnoses:  Exposure to STD  Vaginal discharge     Discharge Instructions     We have treated you today for gonorrhea and chlamydia.  Will notify you of any positive findings and if any changes to treatment are needed.  You may monitor your results on your MyChart online as well.   Please withhold from intercourse for the next week. Please use condoms to prevent STD's.     ED Prescriptions    None     Controlled Substance Prescriptions [place] Controlled Substance Registry consulted? Not Applicable   Zigmund Gottron, NP 02/28/19 [phone]

## 2019-03-01 LAB — HIV ANTIBODY (ROUTINE TESTING W REFLEX): HIV Screen 4th Generation wRfx: NONREACTIVE

## 2019-03-01 LAB — RPR: RPR Ser Ql: NONREACTIVE

## 2019-03-03 ENCOUNTER — Other Ambulatory Visit: Payer: Self-pay

## 2019-03-03 ENCOUNTER — Emergency Department (HOSPITAL_COMMUNITY): Payer: Medicare Other

## 2019-03-03 ENCOUNTER — Encounter (HOSPITAL_COMMUNITY): Payer: Self-pay

## 2019-03-03 ENCOUNTER — Emergency Department (HOSPITAL_COMMUNITY)
Admission: EM | Admit: 2019-03-03 | Discharge: 2019-03-03 | Disposition: A | Discharge status: Home / Self Care | Payer: Medicare Other | Attending: Emergency Medicine | Admitting: Emergency Medicine

## 2019-03-03 DIAGNOSIS — K8020 Calculus of gallbladder without cholecystitis without obstruction: Secondary | ICD-10-CM | POA: Diagnosis not present

## 2019-03-03 DIAGNOSIS — R109 Unspecified abdominal pain: Secondary | ICD-10-CM | POA: Diagnosis present

## 2019-03-03 DIAGNOSIS — I10 Essential (primary) hypertension: Secondary | ICD-10-CM | POA: Diagnosis not present

## 2019-03-03 DIAGNOSIS — Z79899 Other long term (current) drug therapy: Secondary | ICD-10-CM | POA: Diagnosis not present

## 2019-03-03 DIAGNOSIS — R1010 Upper abdominal pain, unspecified: Secondary | ICD-10-CM

## 2019-03-03 LAB — URINALYSIS, ROUTINE W REFLEX MICROSCOPIC
Bacteria, UA: NONE SEEN
Bilirubin Urine: NEGATIVE
Glucose, UA: NEGATIVE mg/dL
Hgb urine dipstick: NEGATIVE
Ketones, ur: NEGATIVE mg/dL
Nitrite: NEGATIVE
Protein, ur: NEGATIVE mg/dL
Specific Gravity, Urine: 1.01 (ref 1.005–1.030)
pH: 7 (ref 5.0–8.0)

## 2019-03-03 LAB — CBC
HCT: 37.7 % (ref 36.0–46.0)
Hemoglobin: 11.1 g/dL — ABNORMAL LOW (ref 12.0–15.0)
MCH: 21.2 pg — ABNORMAL LOW (ref 26.0–34.0)
MCHC: 29.4 g/dL — ABNORMAL LOW (ref 30.0–36.0)
MCV: 71.9 fL — ABNORMAL LOW (ref 80.0–100.0)
Platelets: 439 K/uL — ABNORMAL HIGH (ref 150–400)
RBC: 5.24 MIL/uL — ABNORMAL HIGH (ref 3.87–5.11)
RDW: 18.9 % — ABNORMAL HIGH (ref 11.5–15.5)
WBC: 7.6 K/uL (ref 4.0–10.5)
nRBC: 0 % (ref 0.0–0.2)

## 2019-03-03 LAB — COMPREHENSIVE METABOLIC PANEL WITH GFR
ALT: 17 U/L (ref 0–44)
AST: 16 U/L (ref 15–41)
Albumin: 3.6 g/dL (ref 3.5–5.0)
Alkaline Phosphatase: 93 U/L (ref 38–126)
Anion gap: 9 (ref 5–15)
BUN: 8 mg/dL (ref 6–20)
CO2: 24 mmol/L (ref 22–32)
Calcium: 9 mg/dL (ref 8.9–10.3)
Chloride: 104 mmol/L (ref 98–111)
Creatinine, Ser: 0.82 mg/dL (ref 0.44–1.00)
GFR calc Af Amer: >60 mL/min (ref >60)
GFR calc non Af Amer: >60 mL/min (ref >60)
Glucose, Bld: 101 mg/dL — ABNORMAL HIGH (ref 70–99)
Potassium: 3.6 mmol/L (ref 3.5–5.1)
Sodium: 137 mmol/L (ref 135–145)
Total Bilirubin: 0.3 mg/dL (ref 0.3–1.2)
Total Protein: 8.9 g/dL — ABNORMAL HIGH (ref 6.5–8.1)

## 2019-03-03 LAB — LIPASE, BLOOD: Lipase: 27 U/L (ref 11–51)

## 2019-03-03 LAB — I-STAT BETA HCG BLOOD, ED (MC, WL, AP ONLY): I-stat hCG, quantitative: <5 m[IU]/mL (ref <5)

## 2019-03-03 MED ORDER — SODIUM CHLORIDE 0.9 % IV BOLUS
1000.0000 mL | Freq: Once | INTRAVENOUS | Status: AC
  Administered 2019-03-03: 1000 mL via INTRAVENOUS

## 2019-03-03 MED ORDER — ONDANSETRON HCL 4 MG PO TABS: 4.0000 mg | ORAL_TABLET | Freq: Three times a day (TID) | ORAL | 0 refills | Status: DC | PRN

## 2019-03-03 MED ORDER — ONDANSETRON HCL 4 MG/2ML IJ SOLN
4.0000 mg | Freq: Once | INTRAMUSCULAR | Status: AC
  Administered 2019-03-03: 4 mg via INTRAVENOUS
  Filled 2019-03-03: qty 2

## 2019-03-03 MED ORDER — MORPHINE SULFATE (PF) 4 MG/ML IV SOLN
4.0000 mg | Freq: Once | INTRAVENOUS | Status: AC
  Administered 2019-03-03: 12:00:00 4 mg via INTRAVENOUS
  Filled 2019-03-03: qty 1

## 2019-03-03 MED ORDER — HYDROCODONE-ACETAMINOPHEN 5-325 MG PO TABS: 1.0000 | ORAL_TABLET | Freq: Four times a day (QID) | ORAL | 0 refills | Status: DC | PRN

## 2019-03-03 MED ORDER — FENTANYL CITRATE (PF) 100 MCG/2ML IJ SOLN
100.0000 ug | Freq: Once | INTRAMUSCULAR | Status: AC
  Administered 2019-03-03: 14:00:00 100 ug via INTRAVENOUS
  Filled 2019-03-03: qty 2

## 2019-03-03 MED ORDER — SODIUM CHLORIDE 0.9% FLUSH
3.0000 mL | Freq: Once | INTRAVENOUS | Status: AC
  Administered 2019-03-03: 12:00:00 3 mL via INTRAVENOUS

## 2019-03-03 NOTE — Discharge Instructions (Addendum)
Use Tylenol and ibuprofen as needed for mild to moderate pain. Use Norco as needed for severe breakthrough pain.  Have caution, this may make you tired or groggy.  Do not drive or operate machinery while taking this medicine. Use Zofran as needed for nausea or vomiting. Try to decrease the amount of fatty foods or eating, this will cause your pain to be worse. There is information in the paperwork.  Return to the emergency room if you develop high fevers, persistent vomiting, severe worsening pain.  Return with any new, worsening, concerning symptoms.

## 2019-03-03 NOTE — ED Notes (Signed)
An After Visit Summary was printed and given to the patient. Discharge instructions given and no further questions at this time.  Pt leaving with family.  

## 2019-03-03 NOTE — ED Triage Notes (Signed)
Patient c/o upper abdominal pain and emesis x 1 this AM.  Patient denies any diarrhea

## 2019-03-03 NOTE — ED Provider Notes (Signed)
[facility] Provider Note   CSN: [id number] Arrival date & time: 03/03/19  1057     History   Chief Complaint Chief Complaint  Patient presents with  . Abdominal Pain  . Emesis    HPI Anna King is a 38 y.o. female presenting for evaluation nausea, vomiting, abdominal pain.  Patient states between 6 and 7 AM she started developed severe abdominal pain.  Is sharp, mostly in her upper abdomen.  Is described as a cramping.  She has vomited once.  She states it is constant, nothing makes it better or worse.  She had heavy dinner with bacon at [facility] last night.  She drinks 2 to 3 cups of wine/night, but she does a several days a week.  She denies fevers, chills, cough, chest pain, shortness of breath, urinary symptoms, abnormal bowel movements.  She denies history of similar abdominal pain.  She denies history of previous abdominal surgeries.  No history of GERD or reflux. No sick contacts     HPI  Past Medical History:  Diagnosis Date  . Asthma   . BV (bacterial vaginosis)   . Chlamydia   . Hypertension     Patient Active Problem List   Diagnosis Date Noted  . BMI 50.0-59.9, adult (HCC) 10/22/2018  . Essential hypertension 02/03/2018    Past Surgical History:  Procedure Laterality Date  . WISDOM TOOTH EXTRACTION       OB History    Gravida  3   Para  3   Term  2   Preterm  1   AB      Living  3     SAB      TAB      Ectopic      Multiple      Live Births  3            Home Medications    Prior to Admission medications   Medication Sig Start Date End Date Taking? Authorizing Provider  labetalol (NORMODYNE) 200 MG tablet Take 100 mg by mouth 2 (two) times daily. 01/01/19  Yes [provider]  HYDROcodone-acetaminophen (NORCO/VICODIN) 5-325 MG tablet Take 1 tablet by mouth every 6 (six) hours as needed. 03/03/19   Jenelle Drennon, PA-C  ibuprofen (ADVIL) 600 MG tablet Take 1 tablet (600 mg  total) by mouth every 6 (six) hours. Patient not taking: Reported on 03/03/2019 11/23/18   Cresenzo-Dishmon, Scarlette Calico, CNM  ondansetron (ZOFRAN) 4 MG tablet Take 1 tablet (4 mg total) by mouth every 8 (eight) hours as needed. 03/03/19   Nastassja Witkop, PA-C  Prenatal Vit-Fe Fumarate-FA (PRENATAL COMPLETE) 14-0.4 MG TABS Take 1 tablet by mouth daily. Patient not taking: Reported on 03/03/2019 05/12/18   Georgetta Haber, NP    Family History Family History  Problem Relation Age of Onset  . Diabetes Mother   . Hypertension Mother     Social History Social History   Tobacco Use  . Smoking status: Never Smoker  . Smokeless tobacco: Never Used  Substance Use Topics  . Alcohol use: No  . Drug use: No     Allergies   Shellfish allergy and Betadine [povidone iodine]   Review of Systems Review of Systems  Gastrointestinal: Positive for abdominal pain, nausea and vomiting.  All other systems reviewed and are negative.    Physical Exam Updated Vital Signs BP (!) 156/100   Pulse 70   Temp 98 F (36.7 C) (Oral)   Resp  18   Ht 5\' 7"  (1.702 m)   Wt (!) 149.7 kg   LMP 02/24/2019   SpO2 99%   BMI 51.69 kg/m   Physical Exam Vitals signs and nursing note reviewed.  Constitutional:      General: She is not in acute distress.    Appearance: She is well-developed.     Comments: Morbidly obese patient who appears uncomfortable due to pain, nontoxic in appearance  HENT:     Head: Normocephalic and atraumatic.  Eyes:     Conjunctiva/sclera: Conjunctivae normal.     Pupils: Pupils are equal, round, and reactive to light.  Neck:     Musculoskeletal: Normal range of motion and neck supple.  Cardiovascular:     Rate and Rhythm: Normal rate and regular rhythm.     Pulses: Normal pulses.  Pulmonary:     Effort: Pulmonary effort is normal. No respiratory distress.     Breath sounds: Normal breath sounds. No wheezing.  Abdominal:     General: There is no distension.     Palpations:  Abdomen is soft. There is no mass.     Tenderness: There is abdominal tenderness. There is no guarding or rebound.     Comments: Tenderness palpation of upper abdomen.  Positive Murphy's.  No tenderness palpation of lower abdomen or CVA tenderness.  No rigidity, guarding, distention.  Negative rebound.  Musculoskeletal: Normal range of motion.  Skin:    General: Skin is warm and dry.     Capillary Refill: Capillary refill takes less than 2 seconds.  Neurological:     Mental Status: She is alert and oriented to person, place, and time.      ED Treatments / Results  Labs (all labs ordered are listed, but only abnormal results are displayed) Labs Reviewed  COMPREHENSIVE METABOLIC PANEL - Abnormal; Notable for the following components:      Result Value   Glucose, Bld 101 (*)    Total Protein 8.9 (*)    All other components within normal limits  CBC - Abnormal; Notable for the following components:   RBC 5.24 (*)    Hemoglobin 11.1 (*)    MCV 71.9 (*)    MCH 21.2 (*)    MCHC 29.4 (*)    RDW 18.9 (*)    Platelets 439 (*)    All other components within normal limits  URINALYSIS, ROUTINE W REFLEX MICROSCOPIC - Abnormal; Notable for the following components:   Color, Urine STRAW (*)    Leukocytes,Ua TRACE (*)    All other components within normal limits  LIPASE, BLOOD  I-STAT BETA HCG BLOOD, ED ([facility], [facility], [facility] ONLY)    EKG None  Radiology [place] Abdomen Limited Ruq  Result Date: 03/03/2019 CLINICAL DATA:  Abdominal pain since this morning with nausea and vomiting. EXAM: ULTRASOUND ABDOMEN LIMITED RIGHT UPPER QUADRANT COMPARISON:  None. FINDINGS: Gallbladder: Numerous echogenic shadowing gallstones. The largest gallstone measures 14 mm. No gallbladder wall thickening, pericholecystic fluid or sonographic Murphy sign to suggest acute cholecystitis. Common bile duct: Diameter: 5.5 mm Liver: Normal echogenicity without focal lesion or biliary dilatation. Portal vein is patent on color Doppler  imaging with normal direction of blood flow towards the liver. Other: None. IMPRESSION: 1. Cholelithiasis without sonographic findings for acute cholecystitis. 2. Top-normal common bile duct dilatation at 5.5 mm. 3. Normal liver. Electronically Signed   By: Rudie MeyerP.  Gallerani M.D.   On: 03/03/2019 14:14    Procedures Procedures (including critical care time)  Medications Ordered  in ED Medications  sodium chloride flush (NS) 0.9 % injection 3 mL (3 mLs Intravenous Given 03/03/19 1212)  morphine 4 MG/ML injection 4 mg (4 mg Intravenous Given 03/03/19 1211)  ondansetron (ZOFRAN) injection 4 mg (4 mg Intravenous Given 03/03/19 1212)  sodium chloride 0.9 % bolus 1,000 mL (0 mLs Intravenous Stopped 03/03/19 1355)  fentaNYL (SUBLIMAZE) injection 100 mcg (100 mcg Intravenous Given 03/03/19 1350)     Initial Impression / Assessment and Plan / ED Course  I have reviewed the triage vital signs and the nursing notes.  Pertinent labs & imaging results that were available during my care of the patient were reviewed by me and considered in my medical decision making (see chart for details).        Patient presenting for evaluation of abdominal pain.  Physical examination, she appears nontoxic.  She does have upper abdominal pain, and a positive Murphy's.  Likely gallbladder etiology.  Also consider pancreatitis versus PUD versus GERD versus viral illness.  Will obtain labs and right upper quadrant ultrasound.  Morphine and Zofran for symptom control.  Labs without leukocytosis.  Kidney, liver, pancreatic function reassuring.  Electrolytes stable.  Ultrasound pending.  Ultrasound positive for cholelithiasis without cholecystitis.  On reassessment, patient reports she still having a lot of pain.  Nausea is resolved.  Will give fentanyl and reassess.  On reassessment, patient reports pain is improved, but not completely gone.  I discussed diagnosis of cholelithiasis and symptomatic treatment.  Discussed importance of  follow-up with general surgery on an outpatient basis, as she does not have infection at this time.  Discussed return precautions, including signs of cholecystitis.  Discussed importance of diet.  At this time, patient appears safe for discharge.  Return precautions given.  Patient states she understands and agrees to plan.   Final Clinical Impressions(s) / ED Diagnoses   Final diagnoses:  Upper abdominal pain  Gallstones    ED Discharge Orders         Ordered    HYDROcodone-acetaminophen (NORCO/VICODIN) 5-325 MG tablet  Every 6 hours PRN     03/03/19 1504    ondansetron (ZOFRAN) 4 MG tablet  Every 8 hours PRN     03/03/19 1504           Mardy Hoppe, PA-C 03/03/19 1603    Lacretia Leigh, MD 03/04/19 [id number]

## 2019-03-04 ENCOUNTER — Telehealth (HOSPITAL_COMMUNITY): Payer: Self-pay | Admitting: Emergency Medicine

## 2019-03-04 LAB — CERVICOVAGINAL ANCILLARY ONLY
Bacterial vaginitis: POSITIVE — AB
Candida vaginitis: POSITIVE — AB
Chlamydia: NEGATIVE
Neisseria Gonorrhea: POSITIVE — AB
Trichomonas: NEGATIVE

## 2019-03-04 MED ORDER — METRONIDAZOLE 500 MG PO TABS: 500.0000 mg | ORAL_TABLET | Freq: Two times a day (BID) | ORAL | 0 refills | Status: AC

## 2019-03-04 MED ORDER — FLUCONAZOLE 150 MG PO TABS: 150.0000 mg | ORAL_TABLET | Freq: Once | ORAL | 0 refills | Status: AC

## 2019-03-04 NOTE — Telephone Encounter (Signed)
Test for gonorrhea was positive. This was treated at the urgent care visit with IM rocephin 250mg  and po zithromax 1g. Pt needs education to refrain from sexual intercourse for 7 days after treatment to give the medicine time to work. Sexual partners need to be notified and tested/treated. Condoms may reduce risk of reinfection. Recheck or followup with PCP for further evaluation if symptoms are not improving. [facility] notified.   Bacterial vaginosis is positive. This was not treated at the urgent care visit.  Flagyl 500 mg BID x 7 days #14 no refills sent to patients pharmacy of choice.    Test for candida (yeast) was positive.  Prescription for fluconazole 150mg  po now, repeat dose in 3d if needed, #2 no refills, sent to the pharmacy of record.  Recheck or followup with PCP for further evaluation if symptoms are not improving.    Patient contacted and made aware of    results, all questions answered

## 2019-03-06 ENCOUNTER — Ambulatory Visit: Payer: Self-pay | Admitting: General Surgery

## 2019-03-21 ENCOUNTER — Other Ambulatory Visit: Payer: Self-pay

## 2019-03-21 ENCOUNTER — Encounter (HOSPITAL_COMMUNITY): Payer: Self-pay | Admitting: Emergency Medicine

## 2019-03-21 ENCOUNTER — Emergency Department (HOSPITAL_COMMUNITY)
Admission: EM | Admit: 2019-03-21 | Discharge: 2019-03-21 | Disposition: A | Discharge status: Home / Self Care | Payer: Medicare Other | Attending: Emergency Medicine | Admitting: Emergency Medicine

## 2019-03-21 ENCOUNTER — Other Ambulatory Visit: Payer: Self-pay | Admitting: Emergency Medicine

## 2019-03-21 DIAGNOSIS — I10 Essential (primary) hypertension: Secondary | ICD-10-CM | POA: Insufficient documentation

## 2019-03-21 DIAGNOSIS — Z202 Contact with and (suspected) exposure to infections with a predominantly sexual mode of transmission: Secondary | ICD-10-CM | POA: Insufficient documentation

## 2019-03-21 DIAGNOSIS — Z113 Encounter for screening for infections with a predominantly sexual mode of transmission: Secondary | ICD-10-CM

## 2019-03-21 DIAGNOSIS — J45909 Unspecified asthma, uncomplicated: Secondary | ICD-10-CM | POA: Diagnosis not present

## 2019-03-21 DIAGNOSIS — N760 Acute vaginitis: Secondary | ICD-10-CM

## 2019-03-21 DIAGNOSIS — B9689 Other specified bacterial agents as the cause of diseases classified elsewhere: Secondary | ICD-10-CM

## 2019-03-21 DIAGNOSIS — Z79899 Other long term (current) drug therapy: Secondary | ICD-10-CM | POA: Insufficient documentation

## 2019-03-21 LAB — URINALYSIS, ROUTINE W REFLEX MICROSCOPIC
Bilirubin Urine: NEGATIVE
Glucose, UA: NEGATIVE mg/dL
Hgb urine dipstick: NEGATIVE
Ketones, ur: NEGATIVE mg/dL
Nitrite: NEGATIVE
Protein, ur: 30 mg/dL — AB
Specific Gravity, Urine: 1.02 (ref 1.005–1.030)
pH: 6 (ref 5.0–8.0)

## 2019-03-21 LAB — WET PREP, GENITAL
Sperm: NONE SEEN
Trich, Wet Prep: NONE SEEN
WBC, Wet Prep HPF POC: NONE SEEN
Yeast Wet Prep HPF POC: NONE SEEN

## 2019-03-21 LAB — PREGNANCY, URINE: Preg Test, Ur: NEGATIVE

## 2019-03-21 MED ORDER — METRONIDAZOLE 500 MG PO TABS: 500.0000 mg | ORAL_TABLET | Freq: Two times a day (BID) | ORAL | 0 refills | Status: DC

## 2019-03-21 MED ORDER — AZITHROMYCIN 1 G PO PACK
1.0000 g | PACK | Freq: Once | ORAL | Status: AC
  Administered 2019-03-21: 1 g via ORAL
  Filled 2019-03-21: qty 1

## 2019-03-21 MED ORDER — CEFTRIAXONE SODIUM 250 MG IJ SOLR
250.0000 mg | Freq: Once | INTRAMUSCULAR | Status: AC
  Administered 2019-03-21: 250 mg via INTRAMUSCULAR
  Filled 2019-03-21: qty 250

## 2019-03-21 MED ORDER — METRONIDAZOLE 500 MG PO TABS
500.0000 mg | ORAL_TABLET | Freq: Once | ORAL | Status: AC
  Administered 2019-03-21: 18:00:00 500 mg via ORAL
  Filled 2019-03-21: qty 1

## 2019-03-21 NOTE — Discharge Instructions (Signed)
You have bacterial vaginosis, which is not necessarily an STD.   You are tested for gonorrhea and chlamydia. Check your results online or call in 48 hrs. You were treated for both but your partner will need treatment if you are positive   See your doctor  Return to ER if you have vaginal discharge, pelvic pain, fever

## 2019-03-21 NOTE — ED Triage Notes (Signed)
Pt c/o exposure to STD, pt denies any symptoms of STD.

## 2019-03-21 NOTE — ED Provider Notes (Signed)
[facility] Provider Note   CSN: [id number] Arrival date & time: 03/21/19  1256     History   Chief Complaint Chief Complaint  Patient presents with  . Exposure to STD    HPI Anna King is a 38 y.o. female history of bacterial vaginosis and chlamydia here presenting with STD check.  Patient states that she had unprotected sex with a guy on 16 September.  He claims that she gave him an STD but did not tell her what it is.  She denies any vaginal discharge or pelvic pain or fevers.  She states that she was checked for STD earlier this month and did not have STD at that time.  Upon review of records, she actually had gonorrhea at that time and was treated with Rocephin. She also had BV and was given a course of flagyl.      The history is provided by the patient.    Past Medical History:  Diagnosis Date  . Asthma   . BV (bacterial vaginosis)   . Chlamydia   . Hypertension     Patient Active Problem List   Diagnosis Date Noted  . BMI 50.0-59.9, adult (HCC) 10/22/2018  . Essential hypertension 02/03/2018    Past Surgical History:  Procedure Laterality Date  . WISDOM TOOTH EXTRACTION       OB History    Gravida  3   Para  3   Term  2   Preterm  1   AB      Living  3     SAB      TAB      Ectopic      Multiple      Live Births  3            Home Medications    Prior to Admission medications   Medication Sig Start Date End Date Taking? Authorizing Provider  HYDROcodone-acetaminophen (NORCO/VICODIN) 5-325 MG tablet Take 1 tablet by mouth every 6 (six) hours as needed. 03/03/19   Caccavale, Sophia, PA-C  ibuprofen (ADVIL) 600 MG tablet Take 1 tablet (600 mg total) by mouth every 6 (six) hours. Patient not taking: Reported on 03/03/2019 11/23/18   Cresenzo-Dishmon, Scarlette Calico, CNM  labetalol (NORMODYNE) 200 MG tablet Take 100 mg by mouth 2 (two) times daily. 01/01/19   [provider]  ondansetron (ZOFRAN) 4 MG  tablet Take 1 tablet (4 mg total) by mouth every 8 (eight) hours as needed. 03/03/19   Caccavale, Sophia, PA-C  Prenatal Vit-Fe Fumarate-FA (PRENATAL COMPLETE) 14-0.4 MG TABS Take 1 tablet by mouth daily. Patient not taking: Reported on 03/03/2019 05/12/18   Georgetta Haber, NP    Family History Family History  Problem Relation Age of Onset  . Diabetes Mother   . Hypertension Mother     Social History Social History   Tobacco Use  . Smoking status: Never Smoker  . Smokeless tobacco: Never Used  Substance Use Topics  . Alcohol use: No  . Drug use: No     Allergies   Shellfish allergy and Betadine [povidone iodine]   Review of Systems Review of Systems  Genitourinary: Negative for vaginal bleeding, vaginal discharge and vaginal pain.  All other systems reviewed and are negative.    Physical Exam Updated Vital Signs BP (!) 154/124   Pulse 72   Temp 98.5 F (36.9 C) (Oral)   Resp 17   LMP 03/15/2019   SpO2 100%   Physical Exam  Vitals signs and nursing note reviewed.  HENT:     Head: Normocephalic.     Nose: Nose normal.     Mouth/Throat:     Mouth: Mucous membranes are moist.  Eyes:     Extraocular Movements: Extraocular movements intact.     Pupils: Pupils are equal, round, and reactive to light.  Neck:     Musculoskeletal: Normal range of motion.  Cardiovascular:     Rate and Rhythm: Normal rate and regular rhythm.     Pulses: Normal pulses.     Heart sounds: Normal heart sounds.  Pulmonary:     Effort: Pulmonary effort is normal.  Abdominal:     General: Abdomen is flat.  Genitourinary:    Comments: Pelvic- no obvious discharge, no CMT, IUD string visualized and appears in appropriate position  Skin:    General: Skin is warm.     Capillary Refill: Capillary refill takes less than 2 seconds.  Neurological:     General: No focal deficit present.     Mental Status: She is alert.  Psychiatric:        Mood and Affect: Mood normal.      ED  Treatments / Results  Labs (all labs ordered are listed, but only abnormal results are displayed) Labs Reviewed  WET PREP, GENITAL - Abnormal; Notable for the following components:      Result Value   Clue Cells Wet Prep HPF POC PRESENT (*)    All other components within normal limits  URINALYSIS, ROUTINE W REFLEX MICROSCOPIC - Abnormal; Notable for the following components:   APPearance HAZY (*)    Protein, ur 30 (*)    Leukocytes,Ua SMALL (*)    Bacteria, UA RARE (*)    All other components within normal limits  PREGNANCY, URINE  GC/CHLAMYDIA PROBE AMP ([place]) NOT AT [facility]    EKG None  Radiology No results found.  Procedures Procedures (including critical care time)  Medications Ordered in ED Medications  metroNIDAZOLE (FLAGYL) tablet 500 mg (has no administration in time range)  cefTRIAXone (ROCEPHIN) injection 250 mg (250 mg Intramuscular Given 03/21/19 1706)  azithromycin (ZITHROMAX) powder 1 g (1 g Oral Given 03/21/19 1706)     Initial Impression / Assessment and Plan / ED Course  I have reviewed the triage vital signs and the nursing notes.  Pertinent labs & imaging results that were available during my care of the patient were reviewed by me and considered in my medical decision making (see chart for details).       Anna King is a 38 y.o. female here with possible STD exposure. She had gonorrhea earlier in the month and was treated. She has no discharge or pelvic pain and is afebrile. Will get UA, wet prep, GC/chlamydia. She requested empiric treatment.   5:28 PM Wet prep + BV. Will dc home with course of flagyl. Told her to check her results online or call in 48 hrs. If she is positive, her partner will need treatment   Final Clinical Impressions(s) / ED Diagnoses   Final diagnoses:  None    ED Discharge Orders    None       Drenda Freeze, MD 03/21/19 1729

## 2019-03-24 LAB — CERVICOVAGINAL ANCILLARY ONLY
Chlamydia: NEGATIVE
Neisseria Gonorrhea: NEGATIVE

## 2019-04-13 NOTE — Pre-Procedure Instructions (Signed)
VANA ARIF  04/13/2019      [id number] [address], [place] [address] Phone: [phone] Fax: [phone]    Your procedure is scheduled on Oct. 28  Report to [facility] entrance A at 10:30 A.M.  Call this number if you have problems the morning of surgery:  [phone]   Remember:  Do not eat or drink after midnight.      Take these medicines the morning of surgery with A SIP OF WATER :              Atenolol (tenorman)             7 days prior to surgery STOP taking any Aspirin (unless otherwise instructed by your surgeon), Aleve, Naproxen, Ibuprofen, Motrin, Advil, Goody's, BC's, all herbal medications, fish oil, and all vitamins.    Do not wear jewelry, make-up or nail polish.  Do not wear lotions, powders, or perfumes, or deodorant.  Do not shave 48 hours prior to surgery.  Men may shave face and neck.  Do not bring valuables to the hospital.  [facility] is not responsible for any belongings or valuables.  Contacts, dentures or bridgework may not be worn into surgery.  Leave your suitcase in the car.  After surgery it may be brought to your room.  For patients admitted to the hospital, discharge time will be determined by your treatment team.  Patients discharged the day of surgery will not be allowed to drive home.    Special instructions:   [place]- Preparing For Surgery  Before surgery, you can play an important role. Because skin is not sterile, your skin needs to be as free of germs as possible. You can reduce the number of germs on your skin by washing with CHG (chlorahexidine gluconate) Soap before surgery.  CHG is an antiseptic cleaner which kills germs and bonds with the skin to continue killing germs even after washing.    Oral Hygiene is also important to reduce your risk of infection.  Remember - BRUSH YOUR TEETH THE MORNING OF SURGERY  WITH YOUR REGULAR TOOTHPASTE  Please do not use if you have an allergy to CHG or antibacterial soaps. If your skin becomes reddened/irritated stop using the CHG.  Do not shave (including legs and underarms) for at least 48 hours prior to first CHG shower. It is OK to shave your face.  Please follow these instructions carefully.   1. Shower the NIGHT BEFORE SURGERY and the MORNING OF SURGERY with CHG.   2. If you chose to wash your hair, wash your hair first as usual with your normal shampoo.  3. After you shampoo, rinse your hair and body thoroughly to remove the shampoo.  4. Use CHG as you would any other liquid soap. You can apply CHG directly to the skin and wash gently with a scrungie or a clean washcloth.   5. Apply the CHG Soap to your body ONLY FROM THE NECK DOWN.  Do not use on open wounds or open sores. Avoid contact with your eyes, ears, mouth and genitals (private parts). Wash Face and genitals (private parts)  with your normal soap.  6. Wash thoroughly, paying special attention to the area where your surgery will be performed.  7. Thoroughly rinse your body with warm water from the neck down.  8. DO NOT shower/wash with your normal soap after using  and rinsing off the CHG Soap.  9. Pat yourself dry with a CLEAN TOWEL.  10. Wear CLEAN PAJAMAS to bed the night before surgery, wear comfortable clothes the morning of surgery  11. Place CLEAN SHEETS on your bed the night of your first shower and DO NOT SLEEP WITH PETS.    Day of Surgery:  Do not apply any deodorants/lotions.  Please wear clean clothes to the hospital/surgery center.   Remember to brush your teeth WITH YOUR REGULAR TOOTHPASTE.    Please read over the following fact sheets that you were given. Coughing and Deep Breathing and Surgical Site Infection Prevention

## 2019-04-14 ENCOUNTER — Inpatient Hospital Stay (HOSPITAL_COMMUNITY)
Admission: RE | Admit: 2019-04-14 | Discharge: 2019-04-14 | Disposition: A | Discharge status: Home / Self Care | Payer: Medicare Other | Source: Ambulatory Visit

## 2019-04-14 NOTE — Pre-Procedure Instructions (Signed)
Anna King  04/14/2019      Your procedure is scheduled on  Wednesday, Oct. 28  Report to [facility] entrance A at 10:30 A.M.  Call this number if you have problems the morning of surgery:  [phone]   Remember:  Do not eat or drink after midnight Tuesday, October 27.    Take these medicines the morning of surgery with A SIP OF WATER :              Atenolol (tenorman)             7 days prior to surgery STOP taking any Aspirin (unless otherwise instructed by your surgeon), Aleve, Naproxen, Ibuprofen, Motrin, Advil, Goody's, BC's, all herbal medications, fish oil, and all vitamins.   Special instructions:   Anna King- Preparing For Surgery  Before surgery, you can play an important role. Because skin is not sterile, your skin needs to be as free of germs as possible. You can reduce the number of germs on your skin by washing with CHG (chlorahexidine gluconate) Soap before surgery.  CHG is an antiseptic cleaner which kills germs and bonds with the skin to continue killing germs even after washing.    Oral Hygiene is also important to reduce your risk of infection.  Remember - BRUSH YOUR TEETH THE MORNING OF SURGERY WITH YOUR REGULAR TOOTHPASTE  Please do not use if you have an allergy to CHG or antibacterial soaps. If your skin becomes reddened/irritated stop using the CHG.  Do not shave (including legs and underarms) for at least 48 hours prior to first CHG shower. It is OK to shave your face.  Please follow these instructions carefully.   1. Shower the NIGHT BEFORE SURGERY and the MORNING OF SURGERY with CHG.   2. If you chose to wash your hair, wash your hair first as usual with your normal shampoo.  3. After you shampoo, wash your face and private area with the soap you use at home, then rinse your hair and body thoroughly to remove the shampoo and soap.  4. Use CHG as you would any other liquid soap. You can apply CHG directly to the skin and wash gently  with a scrungie or a clean washcloth.   Apply the CHG Soap to your body ONLY FROM THE NECK DOWN.  Do not use on open wounds or open sores. Avoid contact with your eyes, ears, mouth and genitals (private parts).  5. Wash thoroughly, paying special attention to the area where your surgery will be performed.  6. Thoroughly rinse your body with warm water from the neck down.  7. DO NOT shower/wash with your normal soap after using and rinsing off the CHG Soap.  8. Pat yourself dry with a CLEAN TOWEL.  9. Wear CLEAN PAJAMAS to bed the night before surgery, wear comfortable clothes the morning of surgery  10. Place CLEAN SHEETS on your bed the night of your first shower and DO NOT SLEEP WITH PETS.   Day of Surgery: Shower as instructed above. Do not apply any deodorants/lotions, colones, powers. Please wear clean clothes to the hospital/surgery center.   Remember to brush your teeth WITH YOUR REGULAR TOOTHPASTE.  Do not wear jewelry, make-up or nail polish.  Do not shave 48 hours prior to surgery.    Do not bring valuables to the hospital.  [facility] is not responsible for any belongings or valuables.  Contacts, dentures or bridgework may not be worn into  surgery.  Leave your suitcase in the car.  After surgery it may be brought to your room.  Patients discharged the day of surgery will not be allowed to drive home.   Please read over the following fact sheets that you were given. Coughing and Deep Breathing and Surgical Site Infection Prevention

## 2019-04-16 ENCOUNTER — Encounter (HOSPITAL_COMMUNITY): Payer: Self-pay

## 2019-04-16 ENCOUNTER — Encounter (HOSPITAL_COMMUNITY)
Admission: RE | Admit: 2019-04-16 | Discharge: 2019-04-16 | Disposition: A | Discharge status: Home / Self Care | Payer: Medicare Other | Source: Ambulatory Visit | Attending: General Surgery | Admitting: General Surgery

## 2019-04-16 ENCOUNTER — Other Ambulatory Visit: Payer: Self-pay

## 2019-04-16 DIAGNOSIS — R9431 Abnormal electrocardiogram [ECG] [EKG]: Secondary | ICD-10-CM | POA: Insufficient documentation

## 2019-04-16 DIAGNOSIS — R030 Elevated blood-pressure reading, without diagnosis of hypertension: Secondary | ICD-10-CM | POA: Diagnosis not present

## 2019-04-16 DIAGNOSIS — Z01818 Encounter for other preprocedural examination: Secondary | ICD-10-CM | POA: Diagnosis present

## 2019-04-16 LAB — BASIC METABOLIC PANEL
Anion gap: 7 (ref 5–15)
BUN: 9 mg/dL (ref 6–20)
CO2: 25 mmol/L (ref 22–32)
Calcium: 8.7 mg/dL — ABNORMAL LOW (ref 8.9–10.3)
Chloride: 106 mmol/L (ref 98–111)
Creatinine, Ser: 0.67 mg/dL (ref 0.44–1.00)
GFR calc Af Amer: >60 mL/min (ref >60)
GFR calc non Af Amer: >60 mL/min (ref >60)
Glucose, Bld: 74 mg/dL (ref 70–99)
Potassium: 3.7 mmol/L (ref 3.5–5.1)
Sodium: 138 mmol/L (ref 135–145)

## 2019-04-16 LAB — CBC
HCT: 36.2 % (ref 36.0–46.0)
Hemoglobin: 10.7 g/dL — ABNORMAL LOW (ref 12.0–15.0)
MCH: 21.1 pg — ABNORMAL LOW (ref 26.0–34.0)
MCHC: 29.6 g/dL — ABNORMAL LOW (ref 30.0–36.0)
MCV: 71.3 fL — ABNORMAL LOW (ref 80.0–100.0)
Platelets: 434 10*3/uL — ABNORMAL HIGH (ref 150–400)
RBC: 5.08 MIL/uL (ref 3.87–5.11)
RDW: 21.1 % — ABNORMAL HIGH (ref 11.5–15.5)
WBC: 6.4 10*3/uL (ref 4.0–10.5)
nRBC: 0 % (ref 0.0–0.2)

## 2019-04-16 NOTE — Progress Notes (Addendum)
Scheduled for Covid test on 10/24 No associating sx of Covid. No recent h/o travel. No recent exposure to Covid positive patients.  PCP - Dr  Antonietta Jewel  Cardiologist - denies  Chest x-ray - denies  EKG - today  Stress Test - denies  ECHO - denies  Cardiac Cath -denies   AICD-denies PM-denies LOOP-denies  Sleep Study - denies CPAP - NA  LABS-cbc,bmp  ASA-denies  ERAS-NA  HA1C-denies Fasting Blood Sugar -  Checks Blood Sugar _____ times a day  Anesthesia-Y.  H/o HTN , Asthma, Obesity.  Pt denies having chest pain, sob, palpitations,dizziness or fever at this time. During PAT visit pt was hypertensive 173/112 (128)  ,HR=68. Pt stated that she had not taken her morning dose of Atenolol 100mg . Pt also states that usually her BP is under control although last self check was 2 mths back. Instructed pt to make sure she takes her BP meds the DOS, due to chances of surgery being cancelled if her BP is very high. Anna King, [place] notified.    All instructions explained to the pt, with a verbal understanding of the material. Pt agrees to go over the instructions while at home for a better understanding. Pt also instructed to self quarantine after being tested for COVID-19. The opportunity to ask questions was provided.

## 2019-04-17 NOTE — Anesthesia Preprocedure Evaluation (Addendum)
Anesthesia Evaluation  Patient identified by MRN, date of birth, ID band Patient awake    Reviewed: Allergy & Precautions, NPO status , Patient's Chart, lab work & pertinent test results  History of Anesthesia Complications Negative for: history of anesthetic complications  Airway Mallampati: II  TM Distance: >3 FB Neck ROM: Full    Dental no notable dental hx.    Pulmonary asthma , neg recent URI,    Pulmonary exam normal breath sounds clear to auscultation       Cardiovascular hypertension, Pt. on medications Normal cardiovascular exam Rhythm:Regular Rate:Normal     Neuro/Psych negative neurological ROS  negative psych ROS   GI/Hepatic Neg liver ROS, GALLSTONES   Endo/Other  Morbid obesity  Renal/GU negative Renal ROS     Musculoskeletal negative musculoskeletal ROS (+)   Abdominal   Peds  Hematology negative hematology ROS (+)   Anesthesia Other Findings   Reproductive/Obstetrics                           Anesthesia Physical Anesthesia Plan  ASA: III  Anesthesia Plan: General   Post-op Pain Management:    Induction: Intravenous and Rapid sequence  PONV Risk Score and Plan: 3 and Ondansetron and Dexamethasone  Airway Management Planned: Oral ETT  Additional Equipment: None  Intra-op Plan:   Post-operative Plan: Extubation in OR  Informed Consent: I have reviewed the patients History and Physical, chart, labs and discussed the procedure including the risks, benefits and alternatives for the proposed anesthesia with the patient or authorized representative who has indicated his/her understanding and acceptance.     Dental advisory given  Plan Discussed with: CRNA and Surgeon  Anesthesia Plan Comments: (BP significantly elevated at PAT appt, 173/112. Pt reportedly did not take her AM dose of atenolol 100mg . She was advised that uncontrolled BP could be cause for DOS  cancellation. She was advised to check her BP leading up to surgery, she said she is able to check it at home. If not improving she was advised to call PCP. Dr. Marlou Starks made aware of uncontrolled HTN.   EKG 04/16/19 showed sinus rhythm, rate 62, poor r wave progression, anterior T wave abnormality, no significant change compared to tracing from 2011.)   Anesthesia Quick Evaluation

## 2019-04-17 NOTE — Progress Notes (Signed)
Anesthesia Chart Review: BP significantly elevated at PAT appt, 173/112. Pt reportedly did not take her AM dose of atenolol 100mg . She was advised that uncontrolled BP could be cause for DOS cancellation. She was advised to check her BP leading up to surgery, she said she is able to check it at home. If not improving she was advised to call PCP. Dr. Marlou Starks made aware of uncontrolled HTN.   EKG 04/16/19 showed sinus rhythm, rate 62, poor r wave progression, anterior T wave abnormality, no significant change compared to tracing from 2011.  Anna King [facility]/Anesthesiology Phone [phone] 04/17/2019 12:31 PM

## 2019-04-18 ENCOUNTER — Other Ambulatory Visit (HOSPITAL_COMMUNITY): Payer: Self-pay | Admitting: Cardiology

## 2019-04-18 ENCOUNTER — Other Ambulatory Visit (HOSPITAL_COMMUNITY)
Admission: RE | Admit: 2019-04-18 | Discharge: 2019-04-18 | Disposition: A | Discharge status: Home / Self Care | Payer: Medicare Other | Source: Ambulatory Visit | Attending: General Surgery | Admitting: General Surgery

## 2019-04-18 DIAGNOSIS — Z20828 Contact with and (suspected) exposure to other viral communicable diseases: Secondary | ICD-10-CM | POA: Diagnosis not present

## 2019-04-18 DIAGNOSIS — Z01812 Encounter for preprocedural laboratory examination: Secondary | ICD-10-CM | POA: Diagnosis present

## 2019-04-19 LAB — NOVEL CORONAVIRUS, NAA (HOSP ORDER, SEND-OUT TO REF LAB; TAT 18-24 HRS): SARS-CoV-2, NAA: NOT DETECTED

## 2019-04-21 MED ORDER — DEXTROSE 5 % IV SOLN
3.0000 g | INTRAVENOUS | Status: AC
  Administered 2019-04-22: 12:00:00 3 g via INTRAVENOUS
  Filled 2019-04-21: qty 3

## 2019-04-22 ENCOUNTER — Encounter (HOSPITAL_COMMUNITY): Payer: Self-pay | Admitting: *Deleted

## 2019-04-22 ENCOUNTER — Ambulatory Visit (HOSPITAL_COMMUNITY): Payer: Medicare Other | Admitting: Registered Nurse

## 2019-04-22 ENCOUNTER — Encounter (HOSPITAL_COMMUNITY)
Admission: RE | Disposition: A | Discharge status: Home / Self Care | Payer: Self-pay | Source: Home / Self Care | Attending: General Surgery

## 2019-04-22 ENCOUNTER — Ambulatory Visit (HOSPITAL_COMMUNITY): Payer: Medicare Other

## 2019-04-22 ENCOUNTER — Other Ambulatory Visit: Payer: Self-pay

## 2019-04-22 ENCOUNTER — Ambulatory Visit (HOSPITAL_COMMUNITY): Payer: Medicare Other | Admitting: Physician Assistant

## 2019-04-22 ENCOUNTER — Ambulatory Visit (HOSPITAL_COMMUNITY)
Admission: RE | Admit: 2019-04-22 | Discharge: 2019-04-23 | Disposition: A | Discharge status: Home / Self Care | Payer: Medicare Other | Attending: General Surgery | Admitting: General Surgery

## 2019-04-22 DIAGNOSIS — K8020 Calculus of gallbladder without cholecystitis without obstruction: Secondary | ICD-10-CM | POA: Diagnosis present

## 2019-04-22 DIAGNOSIS — O165 Unspecified maternal hypertension, complicating the puerperium: Secondary | ICD-10-CM | POA: Diagnosis not present

## 2019-04-22 DIAGNOSIS — O99215 Obesity complicating the puerperium: Secondary | ICD-10-CM | POA: Diagnosis not present

## 2019-04-22 DIAGNOSIS — K8010 Calculus of gallbladder with chronic cholecystitis without obstruction: Secondary | ICD-10-CM | POA: Diagnosis not present

## 2019-04-22 DIAGNOSIS — J45909 Unspecified asthma, uncomplicated: Secondary | ICD-10-CM | POA: Insufficient documentation

## 2019-04-22 DIAGNOSIS — Z79899 Other long term (current) drug therapy: Secondary | ICD-10-CM | POA: Insufficient documentation

## 2019-04-22 DIAGNOSIS — Z8249 Family history of ischemic heart disease and other diseases of the circulatory system: Secondary | ICD-10-CM | POA: Diagnosis not present

## 2019-04-22 DIAGNOSIS — O9953 Diseases of the respiratory system complicating the puerperium: Secondary | ICD-10-CM | POA: Diagnosis not present

## 2019-04-22 DIAGNOSIS — K8063 Calculus of gallbladder and bile duct with acute cholecystitis with obstruction: Secondary | ICD-10-CM

## 2019-04-22 DIAGNOSIS — O9963 Diseases of the digestive system complicating the puerperium: Secondary | ICD-10-CM | POA: Diagnosis not present

## 2019-04-22 DIAGNOSIS — Z87891 Personal history of nicotine dependence: Secondary | ICD-10-CM | POA: Insufficient documentation

## 2019-04-22 HISTORY — PX: INTRAOPERATIVE CHOLANGIOGRAM: SHX5230

## 2019-04-22 HISTORY — PX: CHOLECYSTECTOMY: SHX55

## 2019-04-22 LAB — POCT PREGNANCY, URINE: Preg Test, Ur: NEGATIVE

## 2019-04-22 SURGERY — LAPAROSCOPIC CHOLECYSTECTOMY WITH INTRAOPERATIVE CHOLANGIOGRAM
Anesthesia: General | Site: Abdomen

## 2019-04-22 MED ORDER — 0.9 % SODIUM CHLORIDE (POUR BTL) OPTIME
TOPICAL | Status: DC | PRN
  Administered 2019-04-22: 13:00:00 1000 mL

## 2019-04-22 MED ORDER — ROCURONIUM BROMIDE 10 MG/ML (PF) SYRINGE
PREFILLED_SYRINGE | INTRAVENOUS | Status: AC
  Filled 2019-04-22: qty 10

## 2019-04-22 MED ORDER — ONDANSETRON HCL 4 MG/2ML IJ SOLN
INTRAMUSCULAR | Status: DC | PRN
  Administered 2019-04-22: 4 mg via INTRAVENOUS

## 2019-04-22 MED ORDER — DEXAMETHASONE SODIUM PHOSPHATE 10 MG/ML IJ SOLN
INTRAMUSCULAR | Status: AC
  Filled 2019-04-22: qty 1

## 2019-04-22 MED ORDER — ONDANSETRON HCL 4 MG/2ML IJ SOLN
INTRAMUSCULAR | Status: AC
  Filled 2019-04-22: qty 2

## 2019-04-22 MED ORDER — FENTANYL CITRATE (PF) 100 MCG/2ML IJ SOLN
INTRAMUSCULAR | Status: AC
  Administered 2019-04-22: 50 ug via INTRAVENOUS
  Filled 2019-04-22: qty 2

## 2019-04-22 MED ORDER — ONDANSETRON 4 MG PO TBDP: 4.0000 mg | ORAL_TABLET | Freq: Four times a day (QID) | ORAL | Status: DC | PRN

## 2019-04-22 MED ORDER — GABAPENTIN 300 MG PO CAPS
300.0000 mg | ORAL_CAPSULE | ORAL | Status: AC
  Administered 2019-04-22: 11:00:00 300 mg via ORAL

## 2019-04-22 MED ORDER — FENTANYL CITRATE (PF) 100 MCG/2ML IJ SOLN
INTRAMUSCULAR | Status: DC | PRN
  Administered 2019-04-22: 25 ug via INTRAVENOUS
  Administered 2019-04-22: 100 ug via INTRAVENOUS
  Administered 2019-04-22: 25 ug via INTRAVENOUS

## 2019-04-22 MED ORDER — GABAPENTIN 300 MG PO CAPS
ORAL_CAPSULE | ORAL | Status: AC
  Administered 2019-04-22: 300 mg via ORAL
  Filled 2019-04-22: qty 1

## 2019-04-22 MED ORDER — PROPOFOL 10 MG/ML IV BOLUS
INTRAVENOUS | Status: DC | PRN
  Administered 2019-04-22: 20 mg via INTRAVENOUS
  Administered 2019-04-22: 140 mg via INTRAVENOUS

## 2019-04-22 MED ORDER — EPHEDRINE SULFATE-NACL 50-0.9 MG/10ML-% IV SOSY
PREFILLED_SYRINGE | INTRAVENOUS | Status: DC | PRN
  Administered 2019-04-22 (×2): 10 mg via INTRAVENOUS

## 2019-04-22 MED ORDER — LIDOCAINE 2% (20 MG/ML) 5 ML SYRINGE
INTRAMUSCULAR | Status: DC | PRN
  Administered 2019-04-22: 60 mg via INTRAVENOUS

## 2019-04-22 MED ORDER — FENTANYL CITRATE (PF) 250 MCG/5ML IJ SOLN
INTRAMUSCULAR | Status: AC
  Filled 2019-04-22: qty 5

## 2019-04-22 MED ORDER — BUPIVACAINE-EPINEPHRINE (PF) 0.5% -1:200000 IJ SOLN
INTRAMUSCULAR | Status: DC | PRN
  Administered 2019-04-22: 25 mL via PERINEURAL

## 2019-04-22 MED ORDER — OXYCODONE HCL 5 MG PO TABS
ORAL_TABLET | ORAL | Status: AC
  Filled 2019-04-22: qty 1

## 2019-04-22 MED ORDER — ACETAMINOPHEN 500 MG PO TABS: 1000.0000 mg | ORAL_TABLET | Freq: Once | ORAL | Status: DC | PRN

## 2019-04-22 MED ORDER — PHENYLEPHRINE 40 MCG/ML (10ML) SYRINGE FOR IV PUSH (FOR BLOOD PRESSURE SUPPORT)
PREFILLED_SYRINGE | INTRAVENOUS | Status: DC | PRN
  Administered 2019-04-22 (×2): 40 ug via INTRAVENOUS

## 2019-04-22 MED ORDER — SUGAMMADEX SODIUM 200 MG/2ML IV SOLN
INTRAVENOUS | Status: DC | PRN
  Administered 2019-04-22: 350 mg via INTRAVENOUS

## 2019-04-22 MED ORDER — ROCURONIUM BROMIDE 50 MG/5ML IV SOSY
PREFILLED_SYRINGE | INTRAVENOUS | Status: DC | PRN
  Administered 2019-04-22: 100 mg via INTRAVENOUS

## 2019-04-22 MED ORDER — HYDROCODONE-ACETAMINOPHEN 5-325 MG PO TABS: 1.0000 | ORAL_TABLET | Freq: Four times a day (QID) | ORAL | 0 refills | Status: DC | PRN

## 2019-04-22 MED ORDER — CELECOXIB 200 MG PO CAPS
ORAL_CAPSULE | ORAL | Status: AC
  Administered 2019-04-22: 200 mg via ORAL
  Filled 2019-04-22: qty 1

## 2019-04-22 MED ORDER — FENTANYL CITRATE (PF) 100 MCG/2ML IJ SOLN
INTRAMUSCULAR | Status: AC
  Filled 2019-04-22: qty 2

## 2019-04-22 MED ORDER — OXYCODONE HCL 5 MG/5ML PO SOLN: 5.0000 mg | Freq: Once | ORAL | Status: AC | PRN

## 2019-04-22 MED ORDER — HYDROCODONE-ACETAMINOPHEN 5-325 MG PO TABS
1.0000 | ORAL_TABLET | ORAL | Status: DC | PRN
  Administered 2019-04-22 (×2): 1 via ORAL
  Administered 2019-04-23 (×2): 2 via ORAL
  Filled 2019-04-22 (×3): qty 2

## 2019-04-22 MED ORDER — SODIUM CHLORIDE 0.9 % IV SOLN
INTRAVENOUS | Status: DC | PRN
  Administered 2019-04-22: 13:00:00 10 mL

## 2019-04-22 MED ORDER — OXYCODONE HCL 5 MG PO TABS
5.0000 mg | ORAL_TABLET | Freq: Once | ORAL | Status: AC | PRN
  Administered 2019-04-22: 5 mg via ORAL

## 2019-04-22 MED ORDER — CHLORHEXIDINE GLUCONATE CLOTH 2 % EX PADS: 6.0000 | MEDICATED_PAD | Freq: Once | CUTANEOUS | Status: DC

## 2019-04-22 MED ORDER — MIDAZOLAM HCL 2 MG/2ML IJ SOLN
INTRAMUSCULAR | Status: AC
  Filled 2019-04-22: qty 2

## 2019-04-22 MED ORDER — ACETAMINOPHEN 500 MG PO TABS
1000.0000 mg | ORAL_TABLET | ORAL | Status: AC
  Administered 2019-04-22: 11:00:00 1000 mg via ORAL

## 2019-04-22 MED ORDER — DEXAMETHASONE SODIUM PHOSPHATE 10 MG/ML IJ SOLN
INTRAMUSCULAR | Status: DC | PRN
  Administered 2019-04-22: 10 mg via INTRAVENOUS

## 2019-04-22 MED ORDER — LACTATED RINGERS IV SOLN
INTRAVENOUS | Status: DC
  Administered 2019-04-22: 11:00:00 via INTRAVENOUS

## 2019-04-22 MED ORDER — MIDAZOLAM HCL 5 MG/5ML IJ SOLN
INTRAMUSCULAR | Status: DC | PRN
  Administered 2019-04-22: 2 mg via INTRAVENOUS

## 2019-04-22 MED ORDER — HEPARIN SODIUM (PORCINE) 5000 UNIT/ML IJ SOLN
5000.0000 [IU] | Freq: Three times a day (TID) | INTRAMUSCULAR | Status: DC
  Administered 2019-04-23: 5000 [IU] via SUBCUTANEOUS
  Filled 2019-04-22 (×2): qty 1

## 2019-04-22 MED ORDER — LIDOCAINE 2% (20 MG/ML) 5 ML SYRINGE
INTRAMUSCULAR | Status: AC
  Filled 2019-04-22: qty 5

## 2019-04-22 MED ORDER — PROPOFOL 10 MG/ML IV BOLUS
INTRAVENOUS | Status: AC
  Filled 2019-04-22: qty 20

## 2019-04-22 MED ORDER — CELECOXIB 200 MG PO CAPS
200.0000 mg | ORAL_CAPSULE | ORAL | Status: AC
  Administered 2019-04-22: 11:00:00 200 mg via ORAL

## 2019-04-22 MED ORDER — HYDROCODONE-ACETAMINOPHEN 5-325 MG PO TABS
ORAL_TABLET | ORAL | Status: AC
  Filled 2019-04-22: qty 1

## 2019-04-22 MED ORDER — ACETAMINOPHEN 160 MG/5ML PO SOLN: 1000.0000 mg | Freq: Once | ORAL | Status: DC | PRN

## 2019-04-22 MED ORDER — PANTOPRAZOLE SODIUM 40 MG IV SOLR
40.0000 mg | Freq: Every day | INTRAVENOUS | Status: DC
  Administered 2019-04-22: 40 mg via INTRAVENOUS
  Filled 2019-04-22: qty 40

## 2019-04-22 MED ORDER — HYDROCODONE-ACETAMINOPHEN 5-325 MG PO TABS
ORAL_TABLET | ORAL | Status: AC
  Administered 2019-04-22: 1 via ORAL
  Filled 2019-04-22: qty 1

## 2019-04-22 MED ORDER — ONDANSETRON HCL 4 MG/2ML IJ SOLN: 4.0000 mg | Freq: Four times a day (QID) | INTRAMUSCULAR | Status: DC | PRN

## 2019-04-22 MED ORDER — ACETAMINOPHEN 500 MG PO TABS
ORAL_TABLET | ORAL | Status: AC
  Administered 2019-04-22: 1000 mg via ORAL
  Filled 2019-04-22: qty 2

## 2019-04-22 MED ORDER — ACETAMINOPHEN 10 MG/ML IV SOLN: 1000.0000 mg | Freq: Once | INTRAVENOUS | Status: DC | PRN

## 2019-04-22 MED ORDER — BUPIVACAINE-EPINEPHRINE 0.5% -1:200000 IJ SOLN
INTRAMUSCULAR | Status: AC
  Filled 2019-04-22: qty 1

## 2019-04-22 MED ORDER — SODIUM CHLORIDE 0.9 % IR SOLN
Status: DC | PRN
  Administered 2019-04-22: 1000 mL

## 2019-04-22 MED ORDER — KCL IN DEXTROSE-NACL 20-5-0.9 MEQ/L-%-% IV SOLN
INTRAVENOUS | Status: DC
  Filled 2019-04-22: qty 1000

## 2019-04-22 MED ORDER — ATENOLOL 100 MG PO TABS
100.0000 mg | ORAL_TABLET | Freq: Every day | ORAL | Status: DC
  Administered 2019-04-23: 100 mg via ORAL
  Filled 2019-04-22: qty 1
  Filled 2019-04-22: qty 2

## 2019-04-22 MED ORDER — MORPHINE SULFATE (PF) 2 MG/ML IV SOLN: 1.0000 mg | INTRAVENOUS | Status: DC | PRN

## 2019-04-22 MED ORDER — FENTANYL CITRATE (PF) 100 MCG/2ML IJ SOLN
25.0000 ug | INTRAMUSCULAR | Status: DC | PRN
  Administered 2019-04-22 (×3): 50 ug via INTRAVENOUS

## 2019-04-22 SURGICAL SUPPLY — 35 items
APPLIER CLIP 5 13 M/L LIGAMAX5 (MISCELLANEOUS) ×3
BLADE CLIPPER SURG (BLADE) IMPLANT
CANISTER SUCT 3000ML PPV (MISCELLANEOUS) ×3 IMPLANT
CATH REDDICK CHOLANGI 4FR 50CM (CATHETERS) ×3 IMPLANT
CHLORAPREP W/TINT 26 (MISCELLANEOUS) ×3 IMPLANT
CLIP APPLIE 5 13 M/L LIGAMAX5 (MISCELLANEOUS) ×1 IMPLANT
COVER MAYO STAND STRL (DRAPES) ×3 IMPLANT
COVER SURGICAL LIGHT HANDLE (MISCELLANEOUS) ×3 IMPLANT
COVER WAND RF STERILE (DRAPES) ×3 IMPLANT
DERMABOND ADVANCED (GAUZE/BANDAGES/DRESSINGS) ×2
DERMABOND ADVANCED .7 DNX12 (GAUZE/BANDAGES/DRESSINGS) ×1 IMPLANT
DRAPE C-ARM 42X120 X-RAY (DRAPES) ×3 IMPLANT
ELECT REM PT RETURN 9FT ADLT (ELECTROSURGICAL) ×3
ELECTRODE REM PT RTRN 9FT ADLT (ELECTROSURGICAL) ×1 IMPLANT
GLOVE BIO SURGEON STRL SZ7.5 (GLOVE) ×3 IMPLANT
GOWN STRL REUS W/ TWL LRG LVL3 (GOWN DISPOSABLE) ×3 IMPLANT
GOWN STRL REUS W/TWL LRG LVL3 (GOWN DISPOSABLE) ×6
IV CATH 14GX2 1/4 (CATHETERS) ×3 IMPLANT
KIT BASIN OR (CUSTOM PROCEDURE TRAY) ×3 IMPLANT
KIT TURNOVER KIT B (KITS) ×3 IMPLANT
NS IRRIG 1000ML POUR BTL (IV SOLUTION) ×3 IMPLANT
PAD ARMBOARD 7.5X6 YLW CONV (MISCELLANEOUS) ×3 IMPLANT
POUCH SPECIMEN RETRIEVAL 10MM (ENDOMECHANICALS) ×3 IMPLANT
SCISSORS LAP 5X35 DISP (ENDOMECHANICALS) ×3 IMPLANT
SET IRRIG TUBING LAPAROSCOPIC (IRRIGATION / IRRIGATOR) ×3 IMPLANT
SET TUBE SMOKE EVAC HIGH FLOW (TUBING) ×3 IMPLANT
SLEEVE ENDOPATH XCEL 5M (ENDOMECHANICALS) ×6 IMPLANT
SPECIMEN JAR SMALL (MISCELLANEOUS) ×3 IMPLANT
SUT MNCRL AB 4-0 PS2 18 (SUTURE) ×3 IMPLANT
TOWEL GREEN STERILE (TOWEL DISPOSABLE) ×3 IMPLANT
TOWEL GREEN STERILE FF (TOWEL DISPOSABLE) ×3 IMPLANT
TRAY LAPAROSCOPIC MC (CUSTOM PROCEDURE TRAY) ×3 IMPLANT
TROCAR XCEL BLUNT TIP 100MML (ENDOMECHANICALS) ×3 IMPLANT
TROCAR XCEL NON-BLD 5MMX100MML (ENDOMECHANICALS) ×3 IMPLANT
WATER STERILE IRR 1000ML POUR (IV SOLUTION) ×3 IMPLANT

## 2019-04-22 NOTE — Transfer of Care (Signed)
Immediate Anesthesia Transfer of Care Note  Patient: Anna King  Procedure(s) Performed: LAPAROSCOPIC CHOLECYSTECTOMY (N/A Abdomen) Intraoperative Cholangiogram (N/A Abdomen)  Patient Location: PACU  Anesthesia Type:General  Level of Consciousness: awake, alert  and oriented  Airway & Oxygen Therapy: Patient Spontanous Breathing  Post-op Assessment: Report given to RN and Post -op Vital signs reviewed and stable  Post vital signs: Reviewed and stable  Last Vitals:  Vitals Value Taken Time  BP 116/77 04/22/19 1410  Temp    Pulse 59 04/22/19 1418  Resp 17 04/22/19 1418  SpO2 93 % 04/22/19 1418  Vitals shown include unvalidated device data.  Last Pain:  Vitals:   04/22/19 1415  TempSrc:   PainSc: 10-Worst pain ever      Patients Stated Pain Goal: 3 (14/97/02 6378)  Complications: No apparent anesthesia complications

## 2019-04-22 NOTE — Interval H&P Note (Signed)
History and Physical Interval Note:  04/22/2019 11:45 AM  Anna King  has presented today for surgery, with the diagnosis of GALLSTONES.  The various methods of treatment have been discussed with the patient and family. After consideration of risks, benefits and other options for treatment, the patient has consented to  Procedure(s): LAPAROSCOPIC CHOLECYSTECTOMY WITH INTRAOPERATIVE CHOLANGIOGRAM (N/A) as a surgical intervention.  The patient's history has been reviewed, patient examined, no change in status, stable for surgery.  I have reviewed the patient's chart and labs.  Questions were answered to the patient's satisfaction.     [name]

## 2019-04-22 NOTE — Op Note (Signed)
04/22/2019  1:33 PM  PATIENT:  Anna King  38 y.o. female  PRE-OPERATIVE DIAGNOSIS:  GALLSTONES  POST-OPERATIVE DIAGNOSIS:  GALLSTONES  PROCEDURE:  Procedure(s): LAPAROSCOPIC CHOLECYSTECTOMY WITH INTRAOPERATIVE CHOLANGIOGRAM  SURGEON:  Surgeon(s) and Role:    * Jovita Kussmaul, MD - Primary  PHYSICIAN ASSISTANT:   ASSISTANTS: Pryor Curia, RNFA   ANESTHESIA:   local and general  EBL:  minimal   BLOOD ADMINISTERED:none  DRAINS: none   LOCAL MEDICATIONS USED:  MARCAINE     SPECIMEN:  Source of Specimen:  gallbladder  DISPOSITION OF SPECIMEN:  PATHOLOGY  COUNTS:  YES  TOURNIQUET:  * No tourniquets in log *  DICTATION: .Dragon Dictation     Procedure: After informed consent was obtained the patient was brought to the operating room and placed in the supine position on the operating room table. After adequate induction of general anesthesia the patient's abdomen was prepped with ChloraPrep allowed to dry and draped in usual sterile manner. An appropriate timeout was performed. The area below the umbilicus was infiltrated with quarter percent  Marcaine. A small incision was made with a 15 blade knife. The incision was carried down through the subcutaneous tissue bluntly with a hemostat and [employer] retractors. The linea alba was identified. The linea alba was incised with a 15 blade knife and each side was grasped with Coker clamps. The preperitoneal space was then probed with a hemostat until the peritoneum was opened and access was gained to the abdominal cavity. A 0 Vicryl pursestring stitch was placed in the fascia surrounding the opening. A Hassan cannula was then placed through the opening and anchored in place with the previously placed Vicryl purse string stitch. The abdomen was insufflated with carbon dioxide without difficulty. A laparoscope was inserted through the [facility] cannula in the right upper quadrant was inspected. Next the epigastric region was  infiltrated with % Marcaine. A small incision was made with a 15 blade knife. A 5 mm port was placed bluntly through this incision into the abdominal cavity under direct vision. Next 2 sites were chosen laterally on the right side of the abdomen for placement of 5 mm ports. Each of these areas was infiltrated with quarter percent Marcaine. Small stab incisions were made with a 15 blade knife. 5 mm ports were then placed bluntly through these incisions into the abdominal cavity under direct vision without difficulty. A blunt grasper was placed through the lateralmost 5 mm port and used to grasp the dome of the gallbladder and elevated anteriorly and superiorly. Another blunt grasper was placed through the other 5 mm port and used to retract the body and neck of the gallbladder. A dissector was placed through the epigastric port and using the electrocautery the peritoneal reflection at the gallbladder neck was opened. Blunt dissection was then carried out in this area until the gallbladder neck-cystic duct junction was readily identified and a good window was created. A single clip was placed on the gallbladder neck. A small  ductotomy was made just below the clip with laparoscopic scissors. A 14-gauge Angiocath was then placed through the anterior abdominal wall under direct vision. A Reddick cholangiogram catheter was then placed through the Angiocath and flushed. The catheter was then placed in the cystic duct and anchored in place with a clip. A cholangiogram was obtained that showed no filling defects good emptying into the duodenum an adequate length on the cystic duct. The anchoring clip and catheters were then removed from the patient. 3 clips  were placed proximally on the cystic duct and the duct was divided between the 2 sets of clips. Posterior to this the cystic artery was identified and again dissected bluntly in a circumferential manner until a good window  was created. 2 clips were placed proximally  and one distally on the artery and the artery was divided between the 2 sets of clips. Next a laparoscopic hook cautery device was used to separate the gallbladder from the liver bed. Prior to completely detaching the gallbladder from the liver bed the liver bed was inspected and several small bleeding points were coagulated with the electrocautery until the area was completely hemostatic. The gallbladder was then detached the rest of it from the liver bed without difficulty. A laparoscopic bag was inserted through the hassan port. The laparoscope was moved to the epigastric port. The gallbladder was placed within the bag and the bag was sealed.  The bag with the gallbladder was then removed with the [facility] cannula through the infraumbilical port without difficulty. The fascial defect was then closed with the previously placed Vicryl pursestring stitch as well as with another figure-of-eight 0 Vicryl stitch. The liver bed was inspected again and found to be hemostatic. The abdomen was irrigated with copious amounts of saline until the effluent was clear. The ports were then removed under direct vision without difficulty and were found to be hemostatic. The gas was allowed to escape. The skin incisions were all closed with interrupted 4-0 Monocryl subcuticular stitches. Dermabond dressings were applied. The patient tolerated the procedure well. At the end of the case all needle sponge and instrument counts were correct. The patient was then awakened and taken to recovery in stable condition  PLAN OF CARE: Discharge to home after PACU  PATIENT DISPOSITION:  PACU - hemodynamically stable.   Delay start of Pharmacological VTE agent (>24hrs) due to surgical blood loss or risk of bleeding: not applicable

## 2019-04-22 NOTE — Anesthesia Procedure Notes (Signed)
Procedure Name: Intubation Date/Time: 04/22/2019 12:24 PM Performed by: Trinna Post., CRNA Pre-anesthesia Checklist: Patient identified, Emergency Drugs available, Suction available, Patient being monitored and Timeout performed Patient Re-evaluated:Patient Re-evaluated prior to induction Oxygen Delivery Method: Circle system utilized Preoxygenation: Pre-oxygenation with 100% oxygen Induction Type: IV induction Ventilation: Mask ventilation without difficulty Laryngoscope Size: Mac and 4 Grade View: Grade I Tube type: Oral Tube size: 7.0 mm Number of attempts: 1 Airway Equipment and Method: Stylet Placement Confirmation: ETT inserted through vocal cords under direct vision,  positive ETCO2 and breath sounds checked- equal and bilateral Secured at: 22 cm Tube secured with: Tape Dental Injury: Teeth and Oropharynx as per pre-operative assessment

## 2019-04-22 NOTE — H&P (Signed)
Anna King  Location:  Office Patient #: [id number] DOB: [birth year] Undefined / Language: [place] / Race: Black or African American Female   History of Present Illness  The patient is a 38 year old female who presents with abdominal pain. we are asked to see the patient in consultation by Dr. Quitman Livings who evaluated her for gallstones. The patient is a 38 year old black female who recently went to the emergency department with severe epigastric and right upper quadrant pain. She underwent an ultrasound and was found to have multiple gallstones but no gallbladder wall thickening or ductal dilatation. Her liver functions were normal. The pain was associated with nausea and vomiting. The pain seems to have resolved at this point. She did just have a baby and is morbidly obese   Past Surgical History  No pertinent past surgical history   Allergies  Shrimp   Medication History  HYDROcodone-Acetaminophen (5-325MG  Tablet, Oral) Active. Ondansetron HCl (4MG  Tablet, Oral) Active. Medications Reconciled  Social History  Tobacco use  Former smoker.  Family History Breast Cancer  Sister. Cervical Cancer  Sister. Hypertension  Mother. Kidney Disease  Mother.  Pregnancy / Birth History  Irregular periods  Maternal age  75-20 Para  3  Other Problems  Alcohol Abuse  Asthma  Back Pain  Cholelithiasis  High blood pressure     Review of Systems  General Present- Weight Gain. Not Present- Appetite Loss, Chills, Fatigue, Fever, Night Sweats and Weight Loss. Skin Not Present- Change in Wart/Mole, Dryness, Hives, Jaundice, New Lesions, Non-Healing Wounds, Rash and Ulcer. HEENT Not Present- Earache, Hearing Loss, Hoarseness, Nose Bleed, Oral Ulcers, Ringing in the Ears, Seasonal Allergies, Sinus Pain, Sore Throat, Visual Disturbances, Wears glasses/contact lenses and Yellow Eyes. Respiratory Not Present- Bloody sputum, Chronic Cough, Difficulty  Breathing, Snoring and Wheezing. Breast Not Present- Breast Mass, Breast Pain, Nipple Discharge and Skin Changes. Cardiovascular Not Present- Chest Pain, Difficulty Breathing Lying Down, Leg Cramps, Palpitations, Rapid Heart Rate, Shortness of Breath and Swelling of Extremities. Gastrointestinal Not Present- Abdominal Pain, Bloating, Bloody Stool, Change in Bowel Habits, Chronic diarrhea, Constipation, Difficulty Swallowing, Excessive gas, Gets full quickly at meals, Hemorrhoids, Indigestion, Nausea, Rectal Pain and Vomiting. Female Genitourinary Not Present- Frequency, Nocturia, Painful Urination, Pelvic Pain and Urgency. Musculoskeletal Not Present- Back Pain, Joint Pain, Joint Stiffness, Muscle Pain, Muscle Weakness and Swelling of Extremities. Neurological Not Present- Decreased Memory, Fainting, Headaches, Numbness, Seizures, Tingling, Tremor, Trouble walking and Weakness. Psychiatric Not Present- Anxiety, Bipolar, Change in Sleep Pattern, Depression, Fearful and Frequent crying. Endocrine Not Present- Cold Intolerance, Excessive Hunger, Hair Changes, Heat Intolerance, Hot flashes and New Diabetes. Hematology Not Present- Blood Thinners, Easy Bruising, Excessive bleeding, Gland problems, HIV and Persistent Infections.  Vitals  Weight: 337.13 lb Height: 66in Body Surface Area: 2.5 m Body Mass Index: 54.41 kg/m  Pulse: 88 (Regular)        Physical Exam  General Mental Status-Alert. General Appearance-Consistent with stated age. Hydration-Well hydrated. Voice-Normal.  Head and Neck Head-normocephalic, atraumatic with no lesions or palpable masses. Trachea-midline. Thyroid Gland Characteristics - normal size and consistency.  Eye Eyeball - Bilateral-Extraocular movements intact. Sclera/Conjunctiva - Bilateral-No scleral icterus.  Chest and Lung Exam Chest and lung exam reveals -quiet, even and easy respiratory effort with no use of accessory  muscles and on auscultation, normal breath sounds, no adventitious sounds and normal vocal resonance. Inspection Chest Wall - Normal. Back - normal.  Cardiovascular Cardiovascular examination reveals -normal heart sounds, regular rate and rhythm with no murmurs and  normal pedal pulses bilaterally.  Abdomen Inspection Inspection of the abdomen reveals - No Hernias. Skin - Scar - no surgical scars. Palpation/Percussion Palpation and Percussion of the abdomen reveal - Soft, Non Tender, No Rebound tenderness, No Rigidity (guarding) and No hepatosplenomegaly. Auscultation Auscultation of the abdomen reveals - Bowel sounds normal. Note: she is morbidly obese   Neurologic Neurologic evaluation reveals -alert and oriented x 3 with no impairment of recent or remote memory. Mental Status-Normal.  Musculoskeletal Normal Exam - Left-Upper Extremity Strength Normal and Lower Extremity Strength Normal. Normal Exam - Right-Upper Extremity Strength Normal and Lower Extremity Strength Normal.  Lymphatic Head & Neck  General Head & Neck Lymphatics: Bilateral - Description - Normal. Axillary  General Axillary Region: Bilateral - Description - Normal. Tenderness - Non Tender. Femoral & Inguinal  Generalized Femoral & Inguinal Lymphatics: Bilateral - Description - Normal. Tenderness - Non Tender.    Assessment & Plan GALLSTONES (K80.20) Impression: the patient appears to have symptomatic gallstones. Because of the risk of further painful episodes and possible pancreatitis at things she would benefit from having her gallbladder removed. She would also like to have this done. I have discussed with her in detail the risks and benefits of the operation to remove the gallbladder as well as some of the technical aspects occluding the risk of common bile duct injury and she understands and wishes to proceed. I will plan for a laparoscopic cholecystectomy with intraoperative  cholangiogram. Current Plans Pt Education - Gallstones: discussed with patient and provided information.

## 2019-04-23 ENCOUNTER — Encounter (HOSPITAL_COMMUNITY): Payer: Self-pay | Admitting: General Surgery

## 2019-04-23 ENCOUNTER — Other Ambulatory Visit: Payer: Self-pay

## 2019-04-23 DIAGNOSIS — O9963 Diseases of the digestive system complicating the puerperium: Secondary | ICD-10-CM | POA: Diagnosis not present

## 2019-04-23 LAB — SURGICAL PATHOLOGY

## 2019-04-23 MED ORDER — DOCUSATE SODIUM 100 MG PO CAPS: 100.0000 mg | ORAL_CAPSULE | Freq: Two times a day (BID) | ORAL | Status: DC

## 2019-04-23 MED ORDER — SIMETHICONE 80 MG PO CHEW: 80.0000 mg | CHEWABLE_TABLET | Freq: Four times a day (QID) | ORAL | Status: DC | PRN

## 2019-04-23 NOTE — Plan of Care (Signed)
Pt for discharge going home, discontinued peripheral IV line, wound site with skin glue dry and intact, able to tolerate her meal, ambulate, given health teachings, next appointment, due med explained and understood, given pain med.

## 2019-04-23 NOTE — Progress Notes (Signed)
1 Day Post-Op   Subjective/Chief Complaint: Complains of cramps and soreness   Objective: Vital signs in last 24 hours: Temp:  [97 F (36.1 C)-98 F (36.7 C)] 97.6 F (36.4 C) (10/29 0422) Pulse Rate:  [53-120] 60 (10/29 0422) Resp:  [13-22] 18 (10/29 0422) BP: (92-159)/(58-117) 148/98 (10/29 0422) SpO2:  [85 %-100 %] 100 % (10/29 0422) Last BM Date: 04/21/19  Intake/Output from previous day: 10/28 0701 - 10/29 0700 In: 1960 [P.O.:360; I.V.:1600] Out: 415 [Urine:400; Blood:15] Intake/Output this shift: Total I/O In: 360 [P.O.:360] Out: -   General appearance: alert and cooperative Resp: clear to auscultation bilaterally Cardio: regular rate and rhythm GI: soft, mild tenderness  Lab Results:  No results for input(s): WBC, HGB, HCT, PLT in the last 72 hours. BMET No results for input(s): NA, K, CL, CO2, GLUCOSE, BUN, CREATININE, CALCIUM in the last 72 hours. PT/INR No results for input(s): LABPROT, INR in the last 72 hours. ABG No results for input(s): PHART, HCO3 in the last 72 hours.  Invalid input(s): PCO2, PO2  Studies/Results: Dg Cholangiogram Operative  Result Date: 04/22/2019 CLINICAL DATA:  38 year old female with a history of cholelithiasis EXAM: INTRAOPERATIVE CHOLANGIOGRAM TECHNIQUE: Cholangiographic images from the C-arm fluoroscopic device were submitted for interpretation post-operatively. Please see the procedural report for the amount of contrast and the fluoroscopy time utilized. COMPARISON:  None. FINDINGS: Single fluoroscopic spot image submitted. Surgical instruments project over the upper abdomen. Paucity of contrast within the extrahepatic biliary system. Contrast within the duodenum. IMPRESSION: Limited images of intraoperative cholangiogram. Contrast present within the duodenum with only partial opacification of the extrahepatic biliary system. Please refer to the dictated operative report for full details of intraoperative findings and procedure.  Electronically Signed   By: Corrie Mckusick D.O.   On: 04/22/2019 14:18    Anti-infectives: Anti-infectives (From admission, onward)   Start     Dose/Rate Route Frequency Ordered Stop   04/22/19 0730  ceFAZolin (ANCEF) 3 g in dextrose 5 % 50 mL IVPB     3 g 100 mL/hr over 30 Minutes Intravenous To ShortStay Surgical 04/21/19 1329 04/22/19 1255      Assessment/Plan: s/p Procedure(s): LAPAROSCOPIC CHOLECYSTECTOMY (N/A) Intraoperative Cholangiogram (N/A) Advance diet Discharge  LOS: 0 days    [name] 04/23/2019

## 2019-04-23 NOTE — Anesthesia Postprocedure Evaluation (Signed)
Anesthesia Post Note  Patient: GWYNN CHALKER  Procedure(s) Performed: LAPAROSCOPIC CHOLECYSTECTOMY (N/A Abdomen) Intraoperative Cholangiogram (N/A Abdomen)     Patient location during evaluation: PACU Anesthesia Type: General Level of consciousness: awake and alert Pain management: pain level controlled Vital Signs Assessment: post-procedure vital signs reviewed and stable Respiratory status: spontaneous breathing, nonlabored ventilation, respiratory function stable and patient connected to nasal cannula oxygen Cardiovascular status: blood pressure returned to baseline and stable Postop Assessment: no apparent nausea or vomiting Anesthetic complications: no    Last Vitals:  Vitals:   04/23/19 0152 04/23/19 0422  BP: 116/69 (!) 148/98  Pulse: 63 60  Resp: 18 18  Temp: 36.7 C 36.4 C  SpO2:  100%    Last Pain:  Vitals:   04/23/19 0928  TempSrc:   PainSc: 7    Pain Goal: Patients Stated Pain Goal: 3 (04/22/19 1930)                 Montez Hageman

## 2019-08-05 ENCOUNTER — Ambulatory Visit (HOSPITAL_COMMUNITY)
Admission: EM | Admit: 2019-08-05 | Discharge: 2019-08-05 | Disposition: A | Discharge status: Home / Self Care | Payer: Medicare HMO | Attending: Physician Assistant | Admitting: Physician Assistant

## 2019-08-05 ENCOUNTER — Other Ambulatory Visit: Payer: Self-pay

## 2019-08-05 ENCOUNTER — Encounter (HOSPITAL_COMMUNITY): Payer: Self-pay

## 2019-08-05 DIAGNOSIS — R1031 Right lower quadrant pain: Secondary | ICD-10-CM | POA: Diagnosis not present

## 2019-08-05 DIAGNOSIS — R1032 Left lower quadrant pain: Secondary | ICD-10-CM

## 2019-08-05 DIAGNOSIS — N760 Acute vaginitis: Secondary | ICD-10-CM | POA: Insufficient documentation

## 2019-08-05 DIAGNOSIS — B9689 Other specified bacterial agents as the cause of diseases classified elsewhere: Secondary | ICD-10-CM

## 2019-08-05 LAB — POCT URINALYSIS DIP (DEVICE)
Bilirubin Urine: NEGATIVE
Glucose, UA: NEGATIVE mg/dL
Hgb urine dipstick: NEGATIVE
Ketones, ur: NEGATIVE mg/dL
Leukocytes,Ua: NEGATIVE
Nitrite: NEGATIVE
Protein, ur: NEGATIVE mg/dL
Specific Gravity, Urine: 1.025 (ref 1.005–1.030)
Urobilinogen, UA: 2 mg/dL — ABNORMAL HIGH (ref 0.0–1.0)
pH: 7 (ref 5.0–8.0)

## 2019-08-05 MED ORDER — METRONIDAZOLE 500 MG PO TABS: 500.0000 mg | ORAL_TABLET | Freq: Two times a day (BID) | ORAL | 0 refills | Status: DC

## 2019-08-05 MED ORDER — FLUCONAZOLE 150 MG PO TABS: 150.0000 mg | ORAL_TABLET | Freq: Once | ORAL | 0 refills | Status: AC

## 2019-08-05 NOTE — ED Provider Notes (Signed)
[place]    CSN: [id number] Arrival date & time: 08/05/19  1240      History   Chief Complaint Chief Complaint  Patient presents with  . Vaginal Discharge  . Abdominal Pain  . Flank Pain    HPI Anna King is a 39 y.o. female.   Patient reports to urgent care today for vaginal discharge and abdominal discomfort. She reports clear vaginal discharge starting about 3-5 days ago and now developing a "fishy odor". She denies vaginal pain. Endorses vaginal itching and irritation. She has not been sexually active in 3 months. She tested positive and was treated for gonorrhea on 03/04/2019, with negative result on 03/20/2019. She has not had issues since that time. She states this is similar to previous BV episodes she has had.  She reports that her lower abdomen has sharp pains occasionally and this goes into her sides. This has been occurring for about one month. She denies painful urination, frequent or urgent urination. Denies fever, chill, nausea, vomiting, diarrhea.      Past Medical History:  Diagnosis Date  . Asthma   . BV (bacterial vaginosis)   . Chlamydia   . Hypertension     Patient Active Problem List   Diagnosis Date Noted  . Gallstones 04/22/2019  . BMI 50.0-59.9, adult ([place]) 10/22/2018  . Essential hypertension 02/03/2018    Past Surgical History:  Procedure Laterality Date  . CHOLECYSTECTOMY N/A 04/22/2019   Procedure: LAPAROSCOPIC CHOLECYSTECTOMY;  Surgeon: Jovita Kussmaul, MD;  Location: [facility];  Service: General;  Laterality: N/A;  . INTRAOPERATIVE CHOLANGIOGRAM N/A 04/22/2019   Procedure: Intraoperative Cholangiogram;  Surgeon: Jovita Kussmaul, MD;  Location: [place] [facility];  Service: General;  Laterality: N/A;  . WISDOM TOOTH EXTRACTION      OB History    Gravida  3   Para  3   Term  2   Preterm  1   AB      Living  3     SAB      TAB      Ectopic      Multiple      Live Births  3            Home Medications    Prior  to Admission medications   Medication Sig Start Date End Date Taking? Authorizing Provider  atenolol (TENORMIN) 100 MG tablet Take 100 mg by mouth daily.    [provider]  fluconazole (DIFLUCAN) 150 MG tablet Take 1 tablet (150 mg total) by mouth once for 1 dose. 08/05/19 08/05/19  Khalani Novoa, Marguerita Beards, PA-C  HYDROcodone-acetaminophen (NORCO/VICODIN) 5-325 MG tablet Take 1-2 tablets by mouth every 6 (six) hours as needed for moderate pain or severe pain. 04/22/19   Jovita Kussmaul, MD  metroNIDAZOLE (FLAGYL) 500 MG tablet Take 1 tablet (500 mg total) by mouth 2 (two) times daily. 08/05/19   Kaybree Williams, Marguerita Beards, PA-C    Family History Family History  Problem Relation Age of Onset  . Diabetes Mother   . Hypertension Mother     Social History Social History   Tobacco Use  . Smoking status: Never Smoker  . Smokeless tobacco: Never Used  Substance Use Topics  . Alcohol use: Yes    Alcohol/week: 2.0 standard drinks    Types: 2 Glasses of wine per week    Comment: occasional  . Drug use: No     Allergies   Shellfish allergy and Betadine [povidone iodine]  Review of Systems Review of Systems  Constitutional: Negative for chills and fever.  Eyes: Negative for pain and visual disturbance.  Gastrointestinal: Positive for abdominal pain. Negative for constipation, diarrhea, nausea and vomiting.  Genitourinary: Positive for flank pain and vaginal discharge. Negative for dysuria, frequency, hematuria, pelvic pain, urgency, vaginal bleeding and vaginal pain.  Musculoskeletal: Negative for arthralgias and back pain.  Skin: Negative for color change and rash.  Neurological: Negative for seizures, syncope and headaches.  All other systems reviewed and are negative.    Physical Exam Triage Vital Signs ED Triage Vitals  Enc Vitals Group     BP 08/05/19 1308 (!) 160/102     Pulse Rate 08/05/19 1308 70     Resp 08/05/19 1308 17     Temp 08/05/19 1308 98.2 F (36.8 C)     Temp Source  08/05/19 1308 Oral     SpO2 08/05/19 1308 92 %     Weight --      Height --      Head Circumference --      Peak Flow --      Pain Score 08/05/19 1309 6     Pain Loc --      Pain Edu? --      Excl. in GC? --    No data found.  Updated Vital Signs BP (!) 160/102 (BP Location: Left Arm) Comment: didnt take blood pressure medication  Pulse 70   Temp 98.2 F (36.8 C) (Oral)   Resp 17   LMP 07/21/2019   SpO2 92%   Visual Acuity Right Eye Distance:   Left Eye Distance:   Bilateral Distance:    Right Eye Near:   Left Eye Near:    Bilateral Near:     Physical Exam Vitals and nursing note reviewed.  Constitutional:      General: She is not in acute distress.    Appearance: She is well-developed. She is obese. She is not ill-appearing.  HENT:     Head: Normocephalic and atraumatic.  Eyes:     Conjunctiva/sclera: Conjunctivae normal.  Cardiovascular:     Rate and Rhythm: Normal rate and regular rhythm.     Heart sounds: No murmur.  Pulmonary:     Effort: Pulmonary effort is normal. No respiratory distress.     Breath sounds: Normal breath sounds.  Abdominal:     Palpations: Abdomen is soft.     Tenderness: There is abdominal tenderness (Mild tenderness. well tolerated exam) in the right lower quadrant and left lower quadrant. There is no right CVA tenderness, left CVA tenderness, guarding or rebound.     Comments: Obese abdomen  Musculoskeletal:     Cervical back: Neck supple.  Skin:    General: Skin is warm and dry.  Neurological:     General: No focal deficit present.     Mental Status: She is alert and oriented to person, place, and time.  Psychiatric:        Mood and Affect: Mood normal.        Behavior: Behavior normal.      UC Treatments / Results  Labs (all labs ordered are listed, but only abnormal results are displayed) Labs Reviewed  POCT URINALYSIS DIP (DEVICE) - Abnormal; Notable for the following components:      Result Value   Urobilinogen, UA  2.0 (*)    All other components within normal limits  CERVICOVAGINAL ANCILLARY ONLY    EKG   Radiology No results found.  Procedures Procedures (including critical care time)  Medications Ordered in UC Medications - No data to display  Initial Impression / Assessment and Plan / UC Course  I have reviewed the triage vital signs and the nursing notes.  Pertinent labs & imaging results that were available during my care of the patient were reviewed by me and considered in my medical decision making (see chart for details).     #bacterial Vaginsosis #abdominal discomfort - abdominal exam fairly benign. No true pelvic pain during exam. Discharge and odor history appears to be BV related.  No fevers and no recent sexual partners - Flagyl BID x 7 days, diflucan at end of Flagyl  - Swab sent - Gyn care information given and instructed to follow up with future issues   Final Clinical Impressions(s) / UC Diagnoses   Final diagnoses:  BV (bacterial vaginosis)  Abdominal discomfort, bilateral lower quadrant     Discharge Instructions     We have sent the swab in for analysis, we will call to notify you of positive results and arrange further treatment  Please take the flagyl, 2 times a day for 7 days. Take the diflucan at the end of the flagyl  [facility] to discuss establishing [facility]necological care.   If you have worsening abdominal discomfort, develop fever or have change in discharge please notify the urgent care or come in to be seen      ED Prescriptions    Medication Sig Dispense Auth. Provider   metroNIDAZOLE (FLAGYL) 500 MG tablet Take 1 tablet (500 mg total) by mouth 2 (two) times daily. 14 tablet Tycho Cheramie, Veryl Speak, PA-C   fluconazole (DIFLUCAN) 150 MG tablet Take 1 tablet (150 mg total) by mouth once for 1 dose. 1 tablet Lorraine Terriquez, Veryl Speak, PA-C     PDMP not reviewed this encounter.   Hermelinda Medicus, PA-C 08/05/19 1352

## 2019-08-05 NOTE — ED Triage Notes (Signed)
Pt presents with abnormal vaginal discharge, lower left quadrant pelvic cramping, and bilateral flank cramping X 1 month.

## 2019-08-05 NOTE — Discharge Instructions (Signed)
We have sent the swab in for analysis, we will call to notify you of positive results and arrange further treatment  Please take the flagyl, 2 times a day for 7 days. Take the diflucan at the end of the flagyl  [facility] to discuss establishing Gynecological care.   If you have worsening abdominal discomfort, develop fever or have change in discharge please notify the urgent care or come in to be seen

## 2019-08-07 LAB — CERVICOVAGINAL ANCILLARY ONLY
Bacterial vaginitis: POSITIVE — AB
Candida vaginitis: NEGATIVE
Chlamydia: NEGATIVE
Neisseria Gonorrhea: NEGATIVE
Trichomonas: NEGATIVE

## 2019-10-23 ENCOUNTER — Other Ambulatory Visit: Payer: Self-pay | Admitting: Obstetrics and Gynecology

## 2019-10-23 DIAGNOSIS — O10919 Unspecified pre-existing hypertension complicating pregnancy, unspecified trimester: Secondary | ICD-10-CM

## 2019-11-29 ENCOUNTER — Emergency Department (HOSPITAL_COMMUNITY): Payer: Medicare Other

## 2019-11-29 ENCOUNTER — Encounter (HOSPITAL_COMMUNITY): Payer: Self-pay | Admitting: Emergency Medicine

## 2019-11-29 ENCOUNTER — Other Ambulatory Visit: Payer: Self-pay

## 2019-11-29 ENCOUNTER — Emergency Department (HOSPITAL_COMMUNITY)
Admission: EM | Admit: 2019-11-29 | Discharge: 2019-11-29 | Disposition: A | Discharge status: Home / Self Care | Payer: Medicare Other | Attending: Emergency Medicine | Admitting: Emergency Medicine

## 2019-11-29 DIAGNOSIS — I10 Essential (primary) hypertension: Secondary | ICD-10-CM | POA: Insufficient documentation

## 2019-11-29 DIAGNOSIS — J45909 Unspecified asthma, uncomplicated: Secondary | ICD-10-CM | POA: Insufficient documentation

## 2019-11-29 DIAGNOSIS — Z79899 Other long term (current) drug therapy: Secondary | ICD-10-CM | POA: Diagnosis not present

## 2019-11-29 DIAGNOSIS — M62838 Other muscle spasm: Secondary | ICD-10-CM | POA: Diagnosis present

## 2019-11-29 MED ORDER — KETOROLAC TROMETHAMINE 30 MG/ML IJ SOLN
30.0000 mg | Freq: Once | INTRAMUSCULAR | Status: DC
  Filled 2019-11-29: qty 1

## 2019-11-29 MED ORDER — METHOCARBAMOL 500 MG PO TABS
500.0000 mg | ORAL_TABLET | Freq: Once | ORAL | Status: AC
  Administered 2019-11-29: 500 mg via ORAL
  Filled 2019-11-29: qty 1

## 2019-11-29 MED ORDER — METHOCARBAMOL 500 MG PO TABS: 500.0000 mg | ORAL_TABLET | Freq: Two times a day (BID) | ORAL | 0 refills | Status: DC

## 2019-11-29 MED ORDER — ACETAMINOPHEN 325 MG PO TABS
650.0000 mg | ORAL_TABLET | Freq: Once | ORAL | Status: DC
  Filled 2019-11-29: qty 2

## 2019-11-29 MED ORDER — KETOROLAC TROMETHAMINE 30 MG/ML IJ SOLN
30.0000 mg | Freq: Once | INTRAMUSCULAR | Status: AC
  Administered 2019-11-29: 30 mg via INTRAMUSCULAR
  Filled 2019-11-29: qty 1

## 2019-11-29 MED ORDER — IBUPROFEN 200 MG PO TABS
600.0000 mg | ORAL_TABLET | Freq: Once | ORAL | Status: AC | PRN
  Administered 2019-11-29: 600 mg via ORAL
  Filled 2019-11-29: qty 3

## 2019-11-29 NOTE — Discharge Instructions (Addendum)
Take medications to help with your symptoms along with Tylenol or ibuprofen. Follow-up with your primary care provider. Return to the ER if you start to experience worsening spasms, chest pain, shortness of breath, leg swelling.

## 2019-11-29 NOTE — ED Triage Notes (Signed)
Patient states that pain began Tuesday. Patient states that it feels like spasms. Patient denies urinary symptoms, changes in bowel movements, no N/V. Patient is ambulatory.

## 2019-11-29 NOTE — ED Provider Notes (Signed)
[facility]-EMERGENCY DEPT Provider Note   CSN: [id number] Arrival date & time: 11/29/19  0112     History Chief Complaint  Patient presents with  . Flank Pain    Anna King is a 39 y.o. female with a past medical history of hypertension, obesity presenting to the ED with a chief complaint of muscle spasms. Reports spasms throughout her entire right side of her body. States that she is having some spasms in her chest as well. She did take ibuprofen prior to arrival with improvement in her symptoms. Symptoms began approximately 4 days ago. She denies any exertional chest pain, shortness of breath, vomiting, dysuria, hematuria, abdominal pain or fever. She denies injuries or falls.  HPI     Past Medical History:  Diagnosis Date  . Asthma   . BV (bacterial vaginosis)   . Chlamydia   . Hypertension     Patient Active Problem List   Diagnosis Date Noted  . Gallstones 04/22/2019  . BMI 50.0-59.9, adult (HCC) 10/22/2018  . Essential hypertension 02/03/2018    Past Surgical History:  Procedure Laterality Date  . CHOLECYSTECTOMY N/A 04/22/2019   Procedure: LAPAROSCOPIC CHOLECYSTECTOMY;  Surgeon: Griselda Miner, MD;  Location: [facility] OR;  Service: General;  Laterality: N/A;  . INTRAOPERATIVE CHOLANGIOGRAM N/A 04/22/2019   Procedure: Intraoperative Cholangiogram;  Surgeon: Griselda Miner, MD;  Location: [facility] OR;  Service: General;  Laterality: N/A;  . WISDOM TOOTH EXTRACTION       OB History    Gravida  3   Para  3   Term  2   Preterm  1   AB      Living  3     SAB      TAB      Ectopic      Multiple      Live Births  3           Family History  Problem Relation Age of Onset  . Diabetes Mother   . Hypertension Mother     Social History   Tobacco Use  . Smoking status: Never Smoker  . Smokeless tobacco: Never Used  Substance Use Topics  . Alcohol use: Yes    Alcohol/week: 2.0 standard drinks    Types: 2 Glasses of wine per  week    Comment: occasional  . Drug use: No    Home Medications Prior to Admission medications   Medication Sig Start Date End Date Taking? Authorizing Provider  atenolol (TENORMIN) 100 MG tablet Take 100 mg by mouth daily.    [provider]  HYDROcodone-acetaminophen (NORCO/VICODIN) 5-325 MG tablet Take 1-2 tablets by mouth every 6 (six) hours as needed for moderate pain or severe pain. 04/22/19   Griselda Miner, MD  methocarbamol (ROBAXIN) 500 MG tablet Take 1 tablet (500 mg total) by mouth 2 (two) times daily. 11/29/19   Sonu Kruckenberg, PA-C  metroNIDAZOLE (FLAGYL) 500 MG tablet Take 1 tablet (500 mg total) by mouth 2 (two) times daily. 08/05/19   Darr, Veryl Speak, PA-C    Allergies    Shellfish allergy and Betadine [povidone iodine]  Review of Systems   Review of Systems  Constitutional: Negative for appetite change, chills and fever.  HENT: Negative for ear pain, rhinorrhea, sneezing and sore throat.   Eyes: Negative for photophobia and visual disturbance.  Respiratory: Negative for cough, chest tightness, shortness of breath and wheezing.   Cardiovascular: Negative for chest pain and palpitations.  Gastrointestinal: Negative  for abdominal pain, blood in stool, constipation, diarrhea, nausea and vomiting.  Genitourinary: Negative for dysuria, hematuria and urgency.  Musculoskeletal: Positive for myalgias.  Skin: Negative for rash.  Neurological: Negative for dizziness, weakness and light-headedness.    Physical Exam Updated Vital Signs BP (!) 155/106 Comment: pt takes bp meds   Pulse 68   Temp 97.9 F (36.6 C)   Resp 18   Ht 5\' 7"  (1.702 m)   Wt (!) 161.9 kg   SpO2 100%   BMI 55.91 kg/m   Physical Exam Vitals and nursing note reviewed.  Constitutional:      General: She is not in acute distress.    Appearance: She is well-developed. She is obese.  HENT:     Head: Normocephalic and atraumatic.     Nose: Nose normal.  Eyes:     General: No scleral icterus.         Left eye: No discharge.     Conjunctiva/sclera: Conjunctivae normal.  Cardiovascular:     Rate and Rhythm: Normal rate and regular rhythm.     Heart sounds: Normal heart sounds. No murmur. No friction rub. No gallop.   Pulmonary:     Effort: Pulmonary effort is normal. No respiratory distress.     Breath sounds: Normal breath sounds.  Abdominal:     General: Bowel sounds are normal. There is no distension.     Palpations: Abdomen is soft.     Tenderness: There is no abdominal tenderness. There is no guarding.  Musculoskeletal:        General: Normal range of motion.     Cervical back: Normal range of motion and neck supple.  Skin:    General: Skin is warm and dry.     Findings: No rash.  Neurological:     Mental Status: She is alert.     Motor: No abnormal muscle tone.     Coordination: Coordination normal.     ED Results / Procedures / Treatments   Labs (all labs ordered are listed, but only abnormal results are displayed) Labs Reviewed - No data to display  EKG EKG Interpretation  Date/Time:  Sunday November 29 2019 05:59:48 EDT Ventricular Rate:  59 PR Interval:    QRS Duration: 91 QT Interval:  449 QTC Calculation: 445 R Axis:   -12 Text Interpretation: Sinus rhythm Prolonged PR interval Probable anterior infarct, age indeterminate No significant change since last tracing Confirmed by 01-10-1981 (Zadie Rhine) on 11/29/2019 6:16:38 AM   Radiology DG Chest 2 View  Result Date: 11/29/2019 CLINICAL DATA:  Right flank pain beginning approximately 5 days ago. EXAM: CHEST - 2 VIEW COMPARISON:  08/29/2009. FINDINGS: The heart size and mediastinal contours are within normal limits. Both lungs are clear. No pleural effusion or pneumothorax. The visualized skeletal structures are unremarkable. IMPRESSION: No active cardiopulmonary disease. Electronically Signed   By: 10/29/2009 M.D.   On: 11/29/2019 06:18    Procedures Procedures (including critical care  time)  Medications Ordered in ED Medications  ibuprofen (ADVIL) tablet 600 mg (600 mg Oral Given 11/29/19 0143)  methocarbamol (ROBAXIN) tablet 500 mg (500 mg Oral Given 11/29/19 0554)  ketorolac (TORADOL) 30 MG/ML injection 30 mg (30 mg Intramuscular Given 11/29/19 01/29/20)    ED Course  I have reviewed the triage vital signs and the nursing notes.  Pertinent labs & imaging results that were available during my care of the patient were reviewed by me and considered in my medical decision making (see  chart for details).    MDM Rules/Calculators/A&P                      39 year old female presenting to the ED with muscle spasms. States that she is having this sensation in her chest as well. Pain free on my evaluation as she initially took ibuprofen. Lungs are clear to auscultation bilaterally. She is not tachycardic, tachypneic or hypoxic. Will attempt to control symptoms here with muscle relaxer and NSAIDs. Chest x-ray shows no acute findings. EKG shows sinus rhythm, no ischemic changes or changes from prior tracing.  Patient with improvement with NSAIDs and muscle relaxer.  Feel that her symptoms are musculoskeletal in nature, doubt other emergent cardiac or pulmonary cause.  She denies urinary symptoms so I doubt pyelonephritis or kidney stone as a cause of her symptoms as she has no CVA tenderness either.  We will have her continue muscle relaxer, NSAIDs and follow-up with PCP.  All imaging, if done today, including plain films, CT scans, and ultrasounds, independently reviewed by me, and interpretations confirmed via formal radiology reads.  Patient is hemodynamically stable, in NAD, and able to ambulate in the ED. Evaluation does not show pathology that would require ongoing emergent intervention or inpatient treatment. I explained the diagnosis to the patient. Pain has been managed and has no complaints prior to discharge. Patient is comfortable with above plan and is stable for discharge at this  time. All questions were answered prior to disposition. Strict return precautions for returning to the ED were discussed. Encouraged follow up with PCP.   An After Visit Summary was printed and given to the patient.   Portions of this note were generated with [employer]. Dictation errors may occur despite best attempts at proofreading.   Final Clinical Impression(s) / ED Diagnoses Final diagnoses:  Muscle spasm    Rx / DC Orders ED Discharge Orders         Ordered    methocarbamol (ROBAXIN) 500 MG tablet  2 times daily     11/29/19 0628           Delia Heady, PA-C 11/29/19 2409    Ripley Fraise, MD 11/29/19 [phone]

## 2019-11-29 NOTE — ED Notes (Signed)
Pt ambulates with steady gait to room.

## 2020-05-31 IMAGING — US US OB < 14 WEEKS - US OB TV
1 series · 15 of 28 positions shown · non-contrast
Comparison: None.

CLINICAL DATA: Abdominal pain in pregnancy. Eight week 5 day
gestational age by LMP.

EXAM:
OBSTETRIC <14 WK US AND TRANSVAGINAL OB US
TECHNIQUE: Both transabdominal and transvaginal ultrasound examinations were
performed for complete evaluation of the gestation as well as the
maternal uterus, adnexal regions, and pelvic cul-de-sac.
Transvaginal technique was performed to assess early pregnancy.

[Series 1: us ob < 14 weeks - us ob tv · 47 acquisitions, 15 frames shown]
[im 1/47]
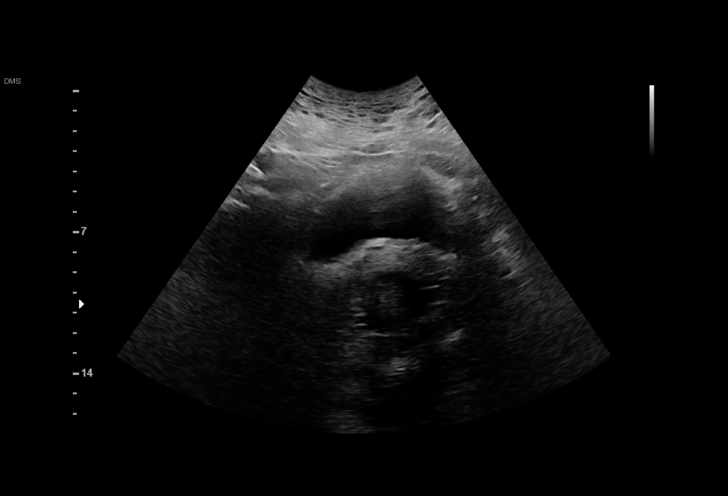
[im 4/47]
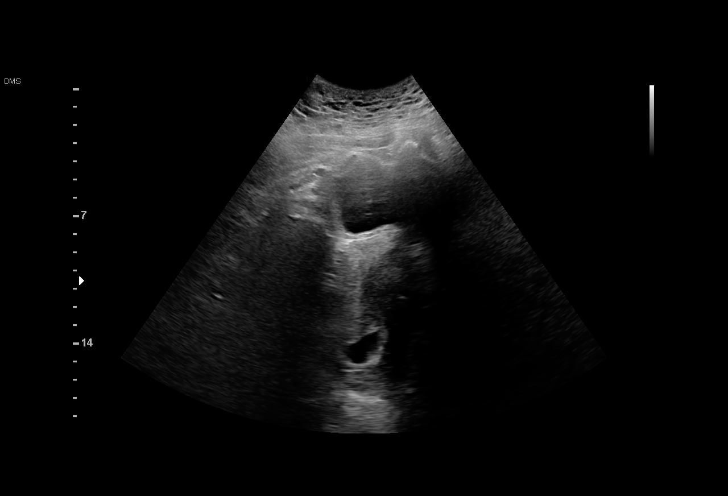
[im 7/47]
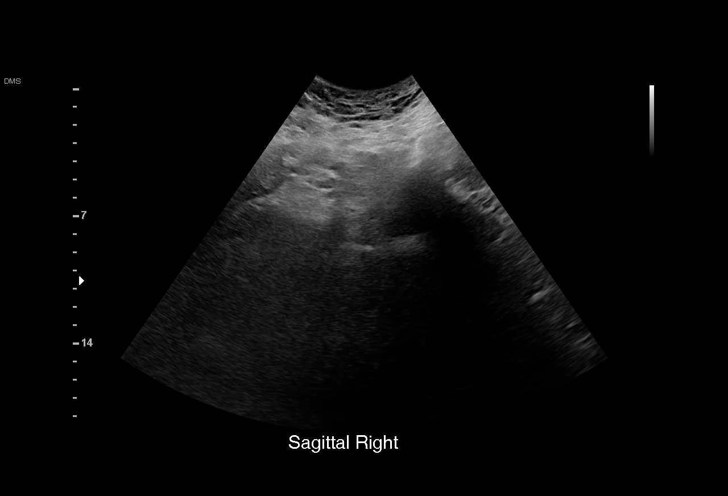
[im 11/47]
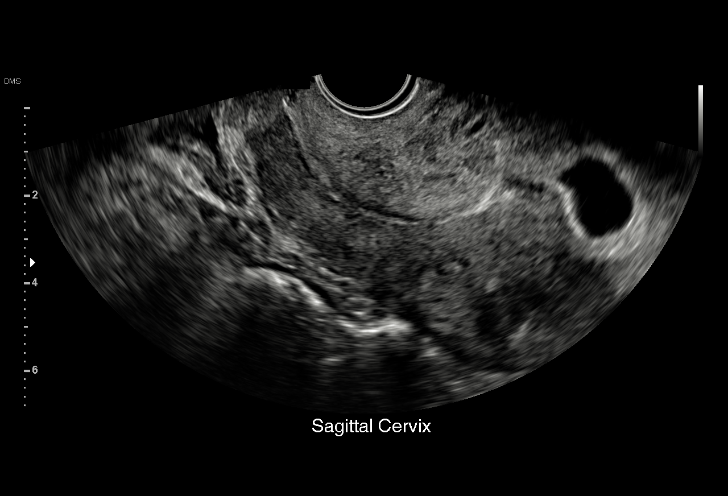
[im 14/47]
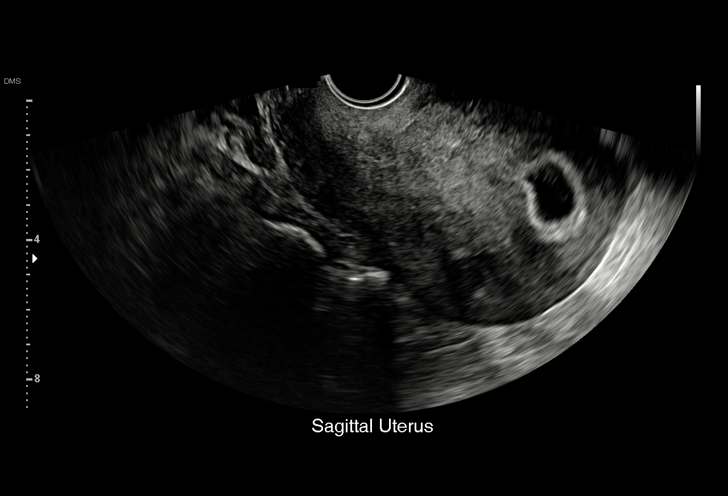
[im 18/47]
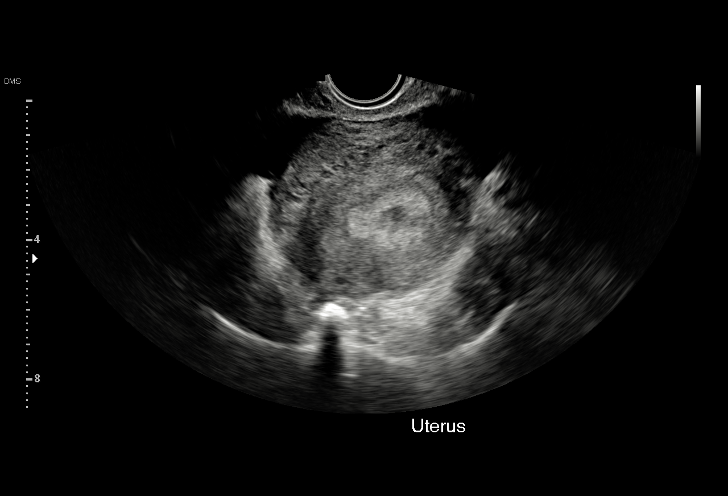
[im 21/47]
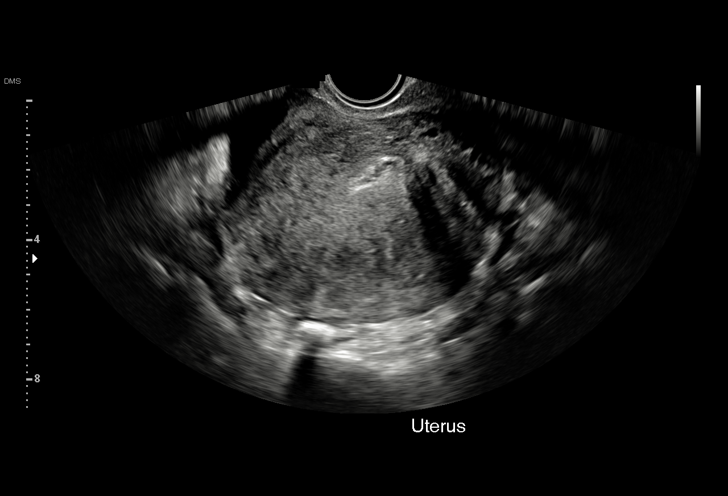
[im 24/47]
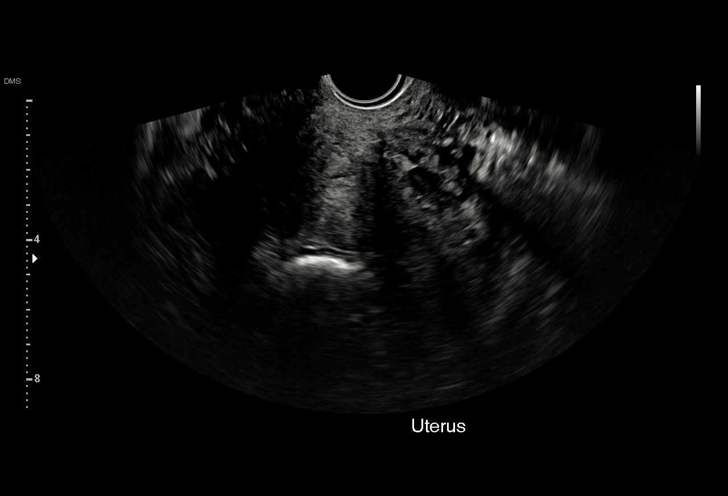
[im 26/47]
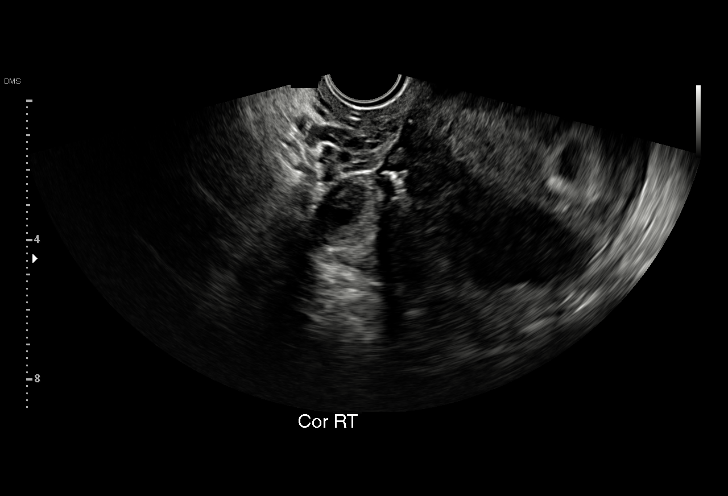
[im 29/47]
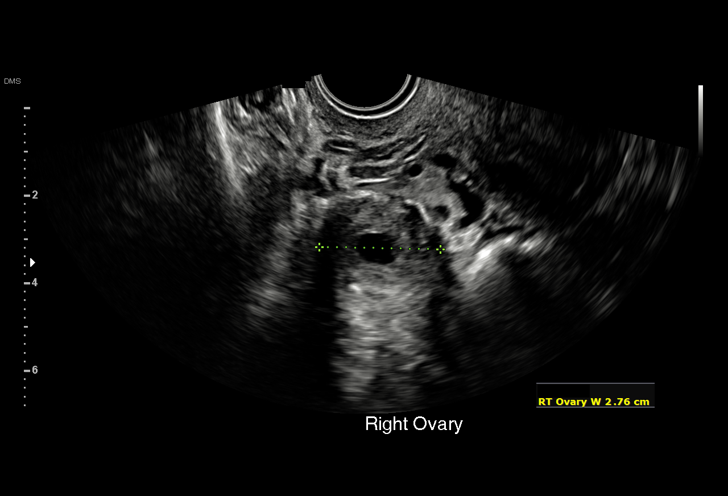
[im 33/47]
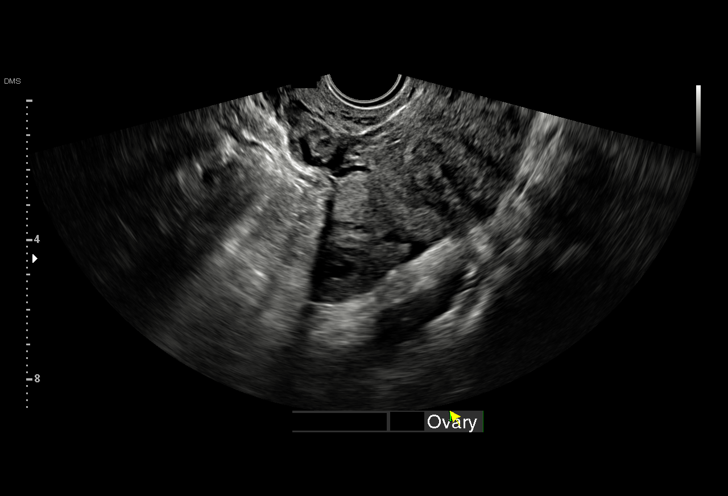
[im 36/47]
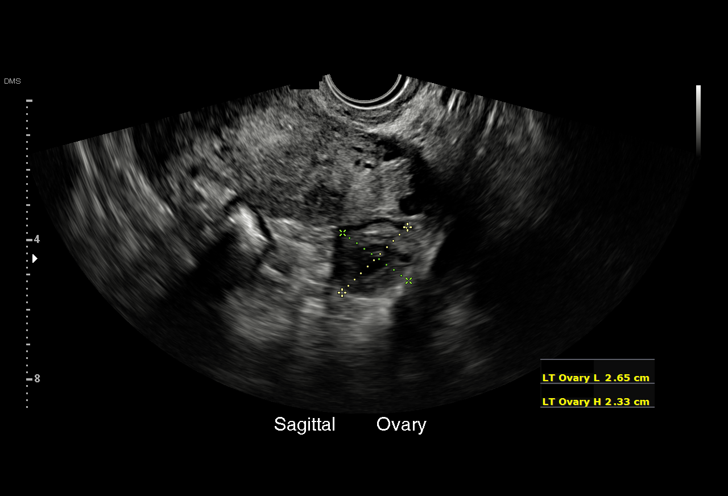
[im 40/47]
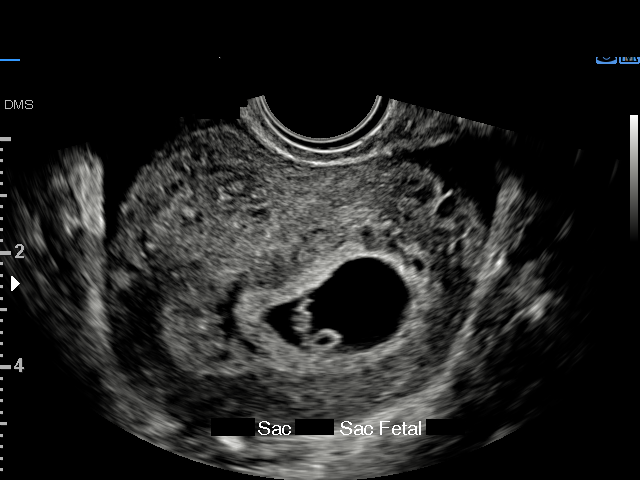
[im 43/47]
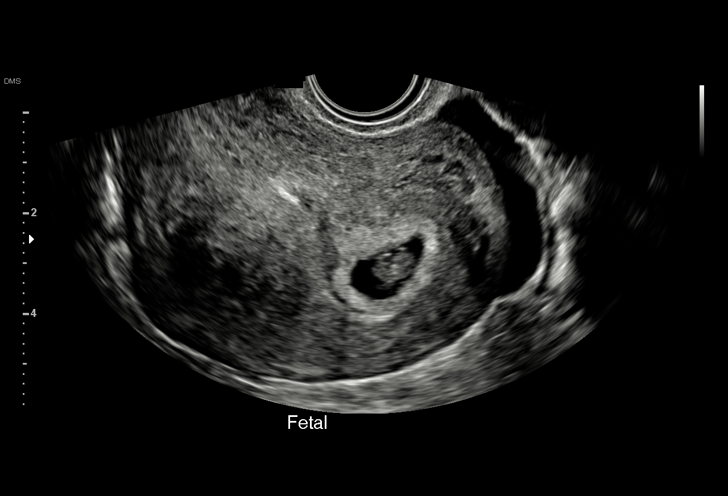
[im 47/47]
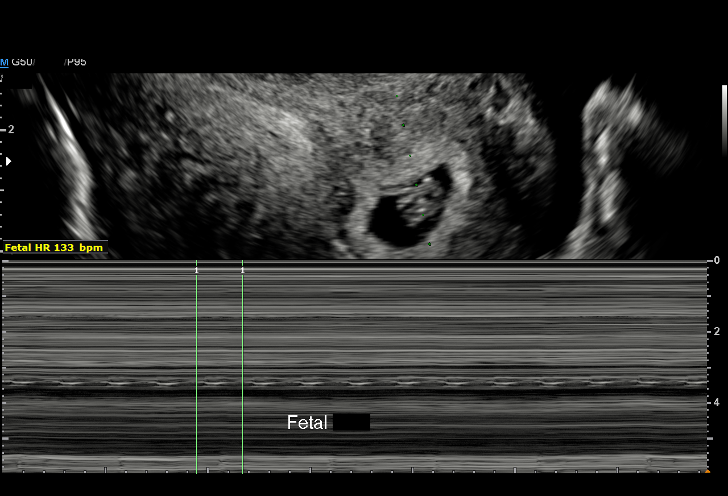

[15 of 28 positions shown; findings below may reference images not displayed]

FINDINGS: Intrauterine gestational sac: Single

Yolk sac:  Visualized.

Embryo:  Visualized.

Cardiac Activity: Visualized.

Heart Rate: 132 bpm

CRL:  10 mm   7 w   1 d                  US EDC: 01/11/2019

Subchorionic hemorrhage:  None visualized.

Maternal uterus/adnexae: Both ovaries are normal in appearance. No
adnexal mass identified. Tiny amount of simple free fluid is seen in
the cul-de-sac.
IMPRESSION: Single living IUP measuring 7 weeks 1 day, with US EDC of
01/11/2019.

No significant maternal uterine or adnexal abnormality identified.

## 2020-06-05 ENCOUNTER — Ambulatory Visit (HOSPITAL_COMMUNITY)
Admission: EM | Admit: 2020-06-05 | Discharge: 2020-06-05 | Disposition: A | Discharge status: Home / Self Care | Payer: Medicare Other | Attending: Internal Medicine | Admitting: Internal Medicine

## 2020-06-05 ENCOUNTER — Encounter (HOSPITAL_COMMUNITY): Payer: Self-pay | Admitting: Emergency Medicine

## 2020-06-05 ENCOUNTER — Other Ambulatory Visit: Payer: Self-pay

## 2020-06-05 DIAGNOSIS — Z79899 Other long term (current) drug therapy: Secondary | ICD-10-CM | POA: Diagnosis not present

## 2020-06-05 DIAGNOSIS — F409 Phobic anxiety disorder, unspecified: Secondary | ICD-10-CM

## 2020-06-05 DIAGNOSIS — Z113 Encounter for screening for infections with a predominantly sexual mode of transmission: Secondary | ICD-10-CM | POA: Insufficient documentation

## 2020-06-05 MED ORDER — METRONIDAZOLE 500 MG PO TABS: 500.0000 mg | ORAL_TABLET | Freq: Two times a day (BID) | ORAL | 0 refills | Status: AC
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
## 2020-06-05 NOTE — Discharge Instructions (Addendum)
Go ahead and take Flagyl as prescribed. We have screened you today for sexually transmitted infection. We will let you know if you need anymore treatment. Please return for any worsening symptoms. No sexual intercourse for 7 days. You need to contact the [facility] if you are not feeling safe in your home.

## 2020-06-05 NOTE — ED Triage Notes (Signed)
Pt c/o vaginal odor onset about a week ago after sexual intercourse. Pt states that she does not have any discharge or vaginal itching.

## 2020-06-06 ENCOUNTER — Other Ambulatory Visit: Payer: Self-pay

## 2020-06-06 ENCOUNTER — Ambulatory Visit (HOSPITAL_COMMUNITY)
Admission: EM | Admit: 2020-06-06 | Discharge: 2020-06-06 | Disposition: A | Discharge status: Home / Self Care | Payer: Medicare Other

## 2020-06-06 LAB — CERVICOVAGINAL ANCILLARY ONLY
Bacterial Vaginitis (gardnerella): POSITIVE — AB
Chlamydia: NEGATIVE
Comment: NEGATIVE
Comment: NEGATIVE
Comment: NEGATIVE
Comment: NORMAL
Neisseria Gonorrhea: POSITIVE — AB
Trichomonas: POSITIVE — AB

## 2020-06-07 ENCOUNTER — Ambulatory Visit (HOSPITAL_COMMUNITY)
Admission: EM | Admit: 2020-06-07 | Discharge: 2020-06-07 | Disposition: A | Discharge status: Home / Self Care | Payer: Medicare Other | Attending: Internal Medicine | Admitting: Internal Medicine

## 2020-06-07 DIAGNOSIS — A549 Gonococcal infection, unspecified: Secondary | ICD-10-CM | POA: Diagnosis not present

## 2020-06-07 MED ORDER — LIDOCAINE HCL (PF) 1 % IJ SOLN
INTRAMUSCULAR | Status: AC
  Filled 2020-06-07: qty 2

## 2020-06-07 MED ORDER — CEFTRIAXONE SODIUM 500 MG IJ SOLR
INTRAMUSCULAR | Status: AC
  Filled 2020-06-07: qty 500

## 2020-06-07 MED ORDER — CEFTRIAXONE SODIUM 500 MG IJ SOLR
500.0000 mg | Freq: Once | INTRAMUSCULAR | Status: AC
  Administered 2020-06-07: 500 mg via INTRAMUSCULAR

## 2020-06-07 NOTE — ED Triage Notes (Signed)
Patient is here for treatment of positive Gonorrhea with 500mg  IM Rocephin

## 2020-09-01 ENCOUNTER — Other Ambulatory Visit: Payer: Self-pay

## 2020-09-01 ENCOUNTER — Emergency Department (HOSPITAL_COMMUNITY)
Admission: EM | Admit: 2020-09-01 | Discharge: 2020-09-02 | Disposition: A | Discharge status: Home / Self Care | Payer: Medicare Other | Attending: Emergency Medicine | Admitting: Emergency Medicine

## 2020-09-01 DIAGNOSIS — J45909 Unspecified asthma, uncomplicated: Secondary | ICD-10-CM | POA: Insufficient documentation

## 2020-09-01 DIAGNOSIS — I10 Essential (primary) hypertension: Secondary | ICD-10-CM | POA: Insufficient documentation

## 2020-09-01 DIAGNOSIS — R109 Unspecified abdominal pain: Secondary | ICD-10-CM | POA: Diagnosis present

## 2020-09-01 DIAGNOSIS — Z79899 Other long term (current) drug therapy: Secondary | ICD-10-CM | POA: Insufficient documentation

## 2020-09-01 DIAGNOSIS — M545 Low back pain, unspecified: Secondary | ICD-10-CM

## 2020-09-01 DIAGNOSIS — M62830 Muscle spasm of back: Secondary | ICD-10-CM | POA: Diagnosis not present

## 2020-09-01 DIAGNOSIS — Z8719 Personal history of other diseases of the digestive system: Secondary | ICD-10-CM | POA: Insufficient documentation

## 2020-09-01 LAB — CBC
HCT: 39.3 % (ref 36.0–46.0)
Hemoglobin: 12.7 g/dL (ref 12.0–15.0)
MCH: 25.2 pg — ABNORMAL LOW (ref 26.0–34.0)
MCHC: 32.3 g/dL (ref 30.0–36.0)
MCV: 78.1 fL — ABNORMAL LOW (ref 80.0–100.0)
Platelets: 358 10*3/uL (ref 150–400)
RBC: 5.03 MIL/uL (ref 3.87–5.11)
RDW: 17.4 % — ABNORMAL HIGH (ref 11.5–15.5)
WBC: 7.4 10*3/uL (ref 4.0–10.5)
nRBC: 0 % (ref 0.0–0.2)

## 2020-09-01 LAB — URINALYSIS, ROUTINE W REFLEX MICROSCOPIC
Bacteria, UA: NONE SEEN
Bilirubin Urine: NEGATIVE
Glucose, UA: NEGATIVE mg/dL
Hgb urine dipstick: NEGATIVE
Ketones, ur: NEGATIVE mg/dL
Nitrite: NEGATIVE
Protein, ur: NEGATIVE mg/dL
Specific Gravity, Urine: 1.027 (ref 1.005–1.030)
pH: 5 (ref 5.0–8.0)

## 2020-09-01 LAB — COMPREHENSIVE METABOLIC PANEL
ALT: 22 U/L (ref 0–44)
AST: 27 U/L (ref 15–41)
Albumin: 2.9 g/dL — ABNORMAL LOW (ref 3.5–5.0)
Alkaline Phosphatase: 82 U/L (ref 38–126)
Anion gap: 4 — ABNORMAL LOW (ref 5–15)
BUN: 8 mg/dL (ref 6–20)
CO2: 24 mmol/L (ref 22–32)
Calcium: 8.5 mg/dL — ABNORMAL LOW (ref 8.9–10.3)
Chloride: 107 mmol/L (ref 98–111)
Creatinine, Ser: 0.85 mg/dL (ref 0.44–1.00)
GFR, Estimated: >60 mL/min (ref >60)
Glucose, Bld: 94 mg/dL (ref 70–99)
Potassium: 3.5 mmol/L (ref 3.5–5.1)
Sodium: 135 mmol/L (ref 135–145)
Total Bilirubin: 0.6 mg/dL (ref 0.3–1.2)
Total Protein: 7.3 g/dL (ref 6.5–8.1)

## 2020-09-01 LAB — LIPASE, BLOOD: Lipase: 30 U/L (ref 11–51)

## 2020-09-01 LAB — I-STAT BETA HCG BLOOD, ED (MC, WL, AP ONLY): I-stat hCG, quantitative: <5 m[IU]/mL (ref <5)

## 2020-09-01 NOTE — ED Triage Notes (Signed)
Abdominal pain (spasms) and back pain started today. Denies nausea/vomiting and diarrhea. Pt was doing heavy lifting at work when pain started. Pt further adds rash around stomach area.

## 2020-09-02 MED ORDER — METHOCARBAMOL 500 MG PO TABS: 500.0000 mg | ORAL_TABLET | Freq: Three times a day (TID) | ORAL | 0 refills | Status: DC | PRN

## 2020-09-02 MED ORDER — MELOXICAM 7.5 MG PO TABS: 7.5000 mg | ORAL_TABLET | Freq: Every day | ORAL | 0 refills | Status: DC

## 2020-09-02 NOTE — ED Notes (Addendum)
Pt in resusc

## 2020-09-02 NOTE — ED Notes (Signed)
Pt is in [facility]US[facility] with family member.

## 2020-09-02 NOTE — ED Provider Notes (Signed)
[facility] EMERGENCY DEPARTMENT Provider Note   CSN: [id number] Arrival date & time: 09/01/20  1914     History Chief Complaint  Patient presents with  . Abdominal Pain    Anna King is a 40 y.o. female.  Patient says she is really here for back spasms.  She started new job at [employer] and for the last month she had lower back spasms anytime she has to do something heavy.  She states this does get better with anti-inflammatories.  Sometimes better with Tylenol.  Sometimes better with rest.  She has tried heat pad lidocaine patches, stretching or weight loss.  She states that when this pains will balance then she will sweat and get a rash on her abdomen and this is why her chief complaint is abdominal pain however she does not have actual abdominal pain.  She said ever since she has had her last child she is had some mild urinary leaking but not incontinence.  No bowel incontinence.  No weakness in her extremities.  No dysuria, increased frequency, urinary symptoms no other associated symptoms.   Abdominal Pain      Past Medical History:  Diagnosis Date  . Asthma   . BV (bacterial vaginosis)   . Chlamydia   . Hypertension     Patient Active Problem List   Diagnosis Date Noted  . Gallstones 04/22/2019  . BMI 50.0-59.9, adult ([facility]) 10/22/2018  . Essential hypertension 02/03/2018    Past Surgical History:  Procedure Laterality Date  . CHOLECYSTECTOMY N/A 04/22/2019   Procedure: LAPAROSCOPIC CHOLECYSTECTOMY;  Surgeon: Griselda Miner, MD;  Location: [facility] OR;  Service: General;  Laterality: N/A;  . INTRAOPERATIVE CHOLANGIOGRAM N/A 04/22/2019   Procedure: Intraoperative Cholangiogram;  Surgeon: Griselda Miner, MD;  Location: [facility] OR;  Service: General;  Laterality: N/A;  . WISDOM TOOTH EXTRACTION       OB History    Gravida  3   Para  3   Term  2   Preterm  1   AB      Living  3     SAB      IAB      Ectopic      Multiple      Live  Births  3           Family History  Problem Relation Age of Onset  . Diabetes Mother   . Hypertension Mother     Social History   Tobacco Use  . Smoking status: Never Smoker  . Smokeless tobacco: Never Used  Vaping Use  . Vaping Use: Never used  Substance Use Topics  . Alcohol use: Yes    Alcohol/week: 2.0 standard drinks    Types: 2 Glasses of wine per week    Comment: occasional  . Drug use: No    Home Medications Prior to Admission medications   Medication Sig Start Date End Date Taking? Authorizing Provider  amLODipine (NORVASC) 10 MG tablet Take 10 mg by mouth daily. 04/12/20   [provider]  atenolol (TENORMIN) 100 MG tablet Take 100 mg by mouth daily.    [provider]  hydrochlorothiazide (HYDRODIURIL) 25 MG tablet Take 25 mg by mouth daily. 04/12/20   [provider]    Allergies    Shellfish allergy and Betadine [povidone iodine]  Review of Systems   Review of Systems  Gastrointestinal: Positive for abdominal pain.  All other systems reviewed and are negative.   Physical Exam  Updated Vital Signs BP (!) 146/103 (BP Location: Left Arm)   Pulse 87   Temp 98.1 F (36.7 C) (Oral)   Resp 18   Ht 5\' 7"  (1.702 m)   Wt (!) 161.9 kg   SpO2 98%   BMI 55.90 kg/m   Physical Exam Vitals and nursing note reviewed.  Constitutional:      Appearance: She is well-developed.  HENT:     Head: Normocephalic and atraumatic.     Mouth/Throat:     Mouth: Mucous membranes are moist.     Pharynx: Oropharynx is clear.  Eyes:     Pupils: Pupils are equal, round, and reactive to light.  Cardiovascular:     Rate and Rhythm: Normal rate and regular rhythm.     Heart sounds: Normal heart sounds.  Pulmonary:     Effort: Pulmonary effort is normal. No respiratory distress.     Breath sounds: No stridor.  Abdominal:     General: Abdomen is flat. There is no distension.  Musculoskeletal:        General: Tenderness (lumbar paraspinal  ttp) present.     Cervical back: Normal range of motion.  Skin:    General: Skin is warm and dry.  Neurological:     General: No focal deficit present.     Mental Status: She is alert.     ED Results / Procedures / Treatments   Labs (all labs ordered are listed, but only abnormal results are displayed) Labs Reviewed  COMPREHENSIVE METABOLIC PANEL - Abnormal; Notable for the following components:      Result Value   Calcium 8.5 (*)    Albumin 2.9 (*)    Anion gap 4 (*)    All other components within normal limits  CBC - Abnormal; Notable for the following components:   MCV 78.1 (*)    MCH 25.2 (*)    RDW 17.4 (*)    All other components within normal limits  URINALYSIS, ROUTINE W REFLEX MICROSCOPIC - Abnormal; Notable for the following components:   APPearance CLOUDY (*)    Leukocytes,Ua TRACE (*)    All other components within normal limits  LIPASE, BLOOD  I-STAT BETA HCG BLOOD, ED ([facility], [facility], [facility] ONLY)    EKG None  Radiology No results found.  Procedures Procedures   Medications Ordered in ED Medications - No data to display  ED Course  I have reviewed the triage vital signs and the nursing notes.  Pertinent labs & imaging results that were available during my care of the patient were reviewed by me and considered in my medical decision making (see chart for details).    MDM Rules/Calculators/A&P                          Doubt cauda equina, epidural abscess, fracture, sciatica or other acute neurologic emergencies requiring workup in ED or imaging at this time. Symptomatic treatment with pcp follow up.   Final Clinical Impression(s) / ED Diagnoses Final diagnoses:  None    Rx / DC Orders ED Discharge Orders    None       Mesner, , MD 09/02/20 [phone]

## 2020-11-01 ENCOUNTER — Encounter: Payer: Self-pay | Admitting: *Deleted

## 2021-01-24 ENCOUNTER — Ambulatory Visit (HOSPITAL_COMMUNITY)
Admission: EM | Admit: 2021-01-24 | Discharge: 2021-01-24 | Disposition: A | Discharge status: Home / Self Care | Payer: 59 | Attending: Family Medicine | Admitting: Family Medicine

## 2021-01-24 ENCOUNTER — Encounter (HOSPITAL_COMMUNITY): Payer: Self-pay | Admitting: Emergency Medicine

## 2021-01-24 DIAGNOSIS — Z114 Encounter for screening for human immunodeficiency virus [HIV]: Secondary | ICD-10-CM | POA: Diagnosis not present

## 2021-01-24 DIAGNOSIS — Z79899 Other long term (current) drug therapy: Secondary | ICD-10-CM | POA: Insufficient documentation

## 2021-01-24 DIAGNOSIS — I10 Essential (primary) hypertension: Secondary | ICD-10-CM | POA: Diagnosis not present

## 2021-01-24 DIAGNOSIS — Z113 Encounter for screening for infections with a predominantly sexual mode of transmission: Secondary | ICD-10-CM | POA: Diagnosis present

## 2021-01-24 DIAGNOSIS — Z8249 Family history of ischemic heart disease and other diseases of the circulatory system: Secondary | ICD-10-CM | POA: Insufficient documentation

## 2021-01-24 DIAGNOSIS — L0291 Cutaneous abscess, unspecified: Secondary | ICD-10-CM | POA: Diagnosis present

## 2021-01-24 DIAGNOSIS — L02214 Cutaneous abscess of groin: Secondary | ICD-10-CM | POA: Diagnosis not present

## 2021-01-24 MED ORDER — DOXYCYCLINE HYCLATE 100 MG PO CAPS: 100.0000 mg | ORAL_CAPSULE | Freq: Two times a day (BID) | ORAL | 0 refills | Status: DC

## 2021-01-24 NOTE — ED Provider Notes (Signed)
[facility]    CSN: [id number] Arrival date & time: 01/24/21  1811      History   Chief Complaint Chief Complaint  Patient presents with   Abscess    HPI Anna King is a 40 y.o. female.   Right Groin Abscess  Started 3 days ago Denies any fevers Reports that it started out as a small pimple and has worsened She has tried to squeeze it to get something to come out of it She states otherwise she has been feeling well She has had a new partner in the last month and would like to be tested for STDs including HIV and RPR LMP 7/28, no concern for pregnancy States that she does shave in this area   Past Medical History:  Diagnosis Date   Asthma    BV (bacterial vaginosis)    Chlamydia    Hypertension     Patient Active Problem List   Diagnosis Date Noted   Gallstones 04/22/2019   BMI 50.0-59.9, adult (HCC) 10/22/2018   Essential hypertension 02/03/2018    Past Surgical History:  Procedure Laterality Date   CHOLECYSTECTOMY N/A 04/22/2019   Procedure: LAPAROSCOPIC CHOLECYSTECTOMY;  Surgeon: Griselda Miner, MD;  Location: [facility] OR;  Service: General;  Laterality: N/A;   INTRAOPERATIVE CHOLANGIOGRAM N/A 04/22/2019   Procedure: Intraoperative Cholangiogram;  Surgeon: Griselda Miner, MD;  Location: [facility] OR;  Service: General;  Laterality: N/A;   WISDOM TOOTH EXTRACTION      OB History     Gravida  3   Para  3   Term  2   Preterm  1   AB      Living  3      SAB      IAB      Ectopic      Multiple      Live Births  3            Home Medications    Prior to Admission medications   Medication Sig Start Date End Date Taking? Authorizing Provider  doxycycline (VIBRAMYCIN) 100 MG capsule Take 1 capsule (100 mg total) by mouth 2 (two) times daily. 01/24/21  Yes Nadeen Shipman, Solmon Ice, DO  amLODipine (NORVASC) 10 MG tablet Take 10 mg by mouth daily. 04/12/20   [provider]  atenolol (TENORMIN) 100 MG tablet Take 100 mg by mouth  daily.    [provider]  hydrochlorothiazide (HYDRODIURIL) 25 MG tablet Take 25 mg by mouth daily. 04/12/20   [provider]  meloxicam (MOBIC) 7.5 MG tablet Take 1 tablet (7.5 mg total) by mouth daily. 09/02/20   Mesner, Barbara Cower, MD  methocarbamol (ROBAXIN) 500 MG tablet Take 1 tablet (500 mg total) by mouth every 8 (eight) hours as needed for muscle spasms. 09/02/20   Mesner, Barbara Cower, MD    Family History Family History  Problem Relation Age of Onset   Diabetes Mother    Hypertension Mother     Social History Social History   Tobacco Use   Smoking status: Never   Smokeless tobacco: Never  Vaping Use   Vaping Use: Never used  Substance Use Topics   Alcohol use: Yes    Alcohol/week: 2.0 standard drinks    Types: 2 Glasses of wine per week    Comment: occasional   Drug use: No     Allergies   Shellfish allergy and Betadine [povidone iodine]   Review of Systems Review of Systems  Constitutional:  Negative for  activity change, appetite change, chills and fever.  HENT:  Negative for congestion.   Respiratory:  Negative for shortness of breath.   Cardiovascular:  Negative for chest pain.  Gastrointestinal:  Negative for abdominal pain.  Genitourinary:  Negative for difficulty urinating, flank pain and vaginal discharge.  Skin:        Right groin abscess  Neurological:  Negative for speech difficulty.    Physical Exam Triage Vital Signs ED Triage Vitals  Enc Vitals Group     BP 01/24/21 1850 (!) 150/107     Pulse Rate 01/24/21 1850 92     Resp 01/24/21 1850 18     Temp 01/24/21 1850 98.1 F (36.7 C)     Temp Source 01/24/21 1850 Oral     SpO2 01/24/21 1850 100 %     Weight --      Height --      Head Circumference --      Peak Flow --      Pain Score 01/24/21 1848 5     Pain Loc --      Pain Edu? --      Excl. in GC? --    No data found.  Updated Vital Signs BP (!) 150/107 (BP Location: Right Wrist)   Pulse 92   Temp 98.1 F (36.7 C)  (Oral)   Resp 18   LMP 01/19/2021   SpO2 100%   Visual Acuity Right Eye Distance:   Left Eye Distance:   Bilateral Distance:    Right Eye Near:   Left Eye Near:    Bilateral Near:     Physical Exam Constitutional:      General: She is not in acute distress.    Appearance: Normal appearance. She is not ill-appearing or toxic-appearing.  Cardiovascular:     Rate and Rhythm: Normal rate.  Pulmonary:     Effort: Pulmonary effort is normal.     Breath sounds: Normal breath sounds.  Abdominal:     Comments: Significant pannus  Skin:    General: Skin is warm and dry.       Neurological:     Mental Status: She is alert and oriented to person, place, and time.  Psychiatric:        Mood and Affect: Mood normal.        Behavior: Behavior normal.        Thought Content: Thought content normal.        Judgment: Judgment normal.     UC Treatments / Results  Labs (all labs ordered are listed, but only abnormal results are displayed) Labs Reviewed  HIV ANTIBODY (ROUTINE TESTING W REFLEX)  RPR  CERVICOVAGINAL ANCILLARY ONLY    EKG   Radiology No results found.  Procedures Procedures (including critical care time)  Medications Ordered in UC Medications - No data to display  Initial Impression / Assessment and Plan / UC Course  I have reviewed the triage vital signs and the nursing notes.  Pertinent labs & imaging results that were available during my care of the patient were reviewed by me and considered in my medical decision making (see chart for details).     Patient is a 40 year old female with PMH HTN and Morbid obesity who presents with 3 days of right inguinal abscess.  No obvious area of fluctaunce on exam and no fevers.  Given this, discussed with patient I&D versus oral antibiotics and she would ultimately like to proceed with oral antibiotics to avoid I&D.  Discussed with her that this may be related to her significant pannus with increased moisture in the  skin folds, could also be related to shaving and recommended changing razors.  She will perform a vaginal swab prior to discharge for STD testing and also obtain HIV and RPR at her request for STD testing.  She was prescribed doxycycline to take for 10 days.  Advised her if she has significant worsening of her symptoms, especially if she develops fever or does not have improvement with oral antibiotics, she should be seen by medical provider right away.  Her vital signs were stable aside from elevated blood pressure to 150/107.  She was asymptomatic.  Advised to follow-up with PCP regarding this.  She was discharged home in stable condition.   Final Clinical Impressions(s) / UC Diagnoses   Final diagnoses:  Abscess  Screen for sexually transmitted diseases     Discharge Instructions      You have an abscess in your right groin.  Since it is not obviously full of fluid, we will try to treat this with oral antibiotics.  You will take these twice daily for the next 10 days.  You should eat yogurt while you take this.  You should also note that it can increase sensitivity to the sun.  If you have significant worsening of your symptoms or if you develop fever, you should be seen by medical provider right away.     ED Prescriptions     Medication Sig Dispense Auth. Provider   doxycycline (VIBRAMYCIN) 100 MG capsule Take 1 capsule (100 mg total) by mouth 2 (two) times daily. 20 capsule Anna King, Solmon Ice, DO      PDMP not reviewed this encounter.   Eula Jaster, Solmon Ice, DO 01/24/21 2024

## 2021-01-24 NOTE — Discharge Instructions (Addendum)
You have an abscess in your right groin.  Since it is not obviously full of fluid, we will try to treat this with oral antibiotics.  You will take these twice daily for the next 10 days.  You should eat yogurt while you take this.  You should also note that it can increase sensitivity to the sun.  If you have significant worsening of your symptoms or if you develop fever, you should be seen by medical provider right away.

## 2021-01-24 NOTE — ED Triage Notes (Signed)
Pt presents with abscess on right thigh xs 2 days. Pt would also like to be tested for STDs. Denies any symptoms at this time.

## 2021-01-25 LAB — CERVICOVAGINAL ANCILLARY ONLY
Chlamydia: POSITIVE — AB
Comment: NEGATIVE
Comment: NEGATIVE
Comment: NORMAL
Neisseria Gonorrhea: NEGATIVE
Trichomonas: NEGATIVE

## 2021-01-25 LAB — HIV ANTIBODY (ROUTINE TESTING W REFLEX): HIV Screen 4th Generation wRfx: NONREACTIVE

## 2021-01-25 LAB — RPR: RPR Ser Ql: NONREACTIVE

## 2021-03-08 IMAGING — US US ABDOMEN LIMITED
1 series · 14 of 25 positions shown · non-contrast
Comparison: None.

CLINICAL DATA: Abdominal pain since this morning with nausea and
vomiting.

EXAM:
ULTRASOUND ABDOMEN LIMITED RIGHT UPPER QUADRANT

[Series 1: us abdomen limited · 14 of 49 slices shown]
[im 1/49]
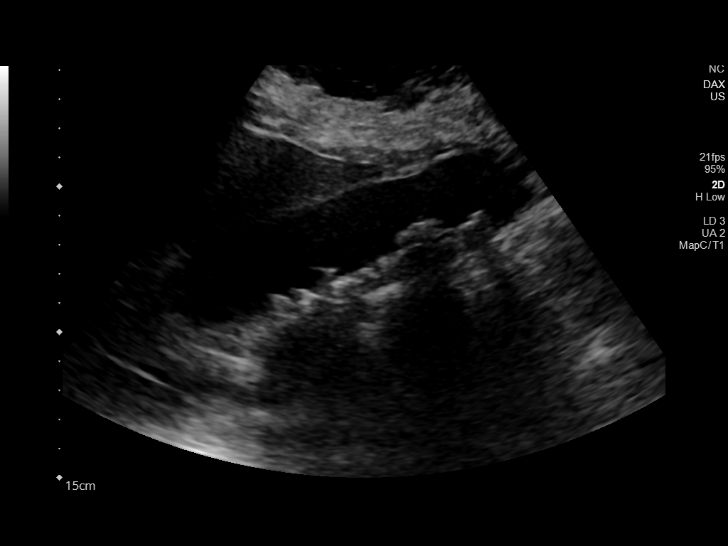
[im 5/49]
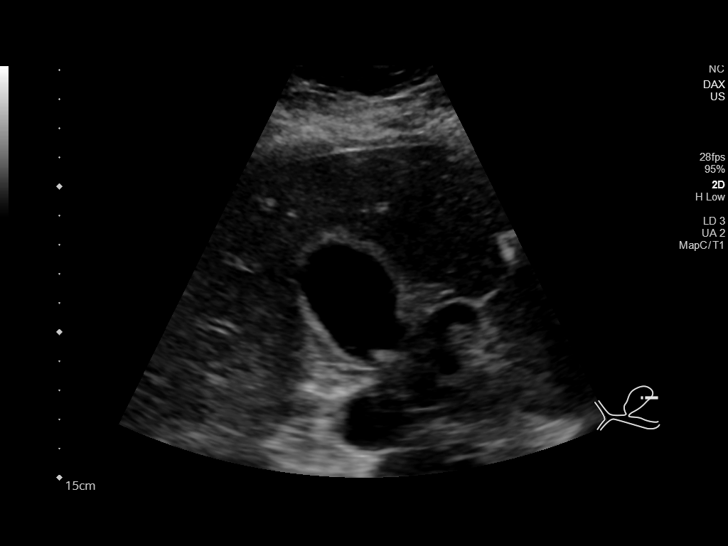
[im 9/49]
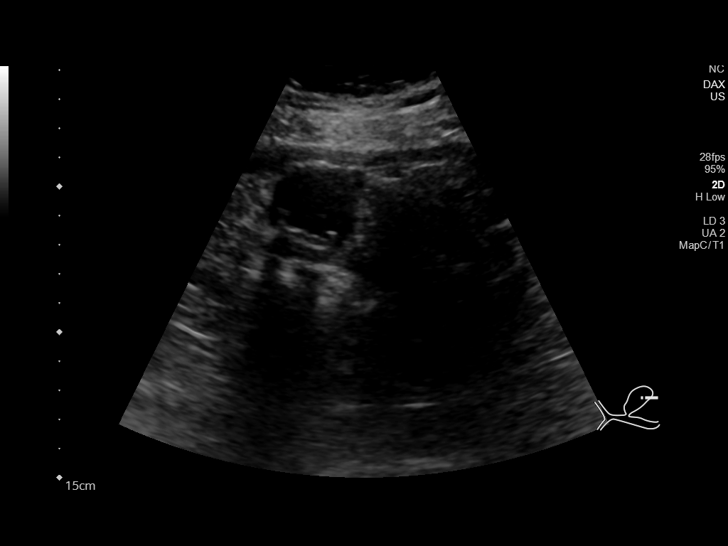
[im 13/49]
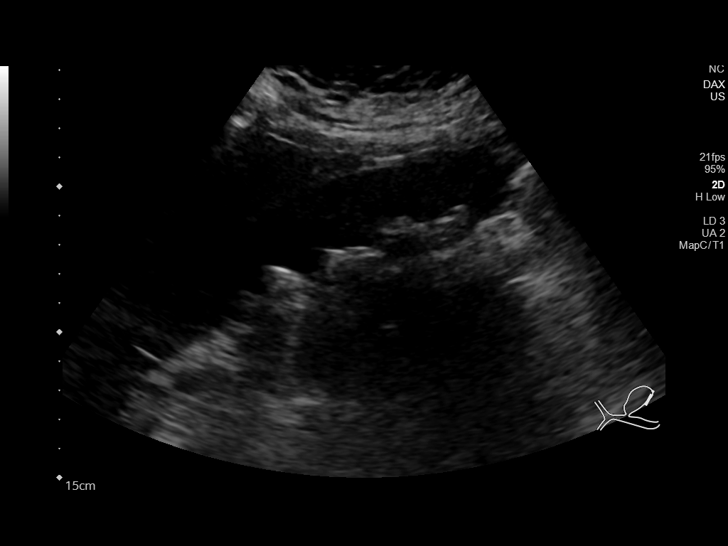
[im 17/49]
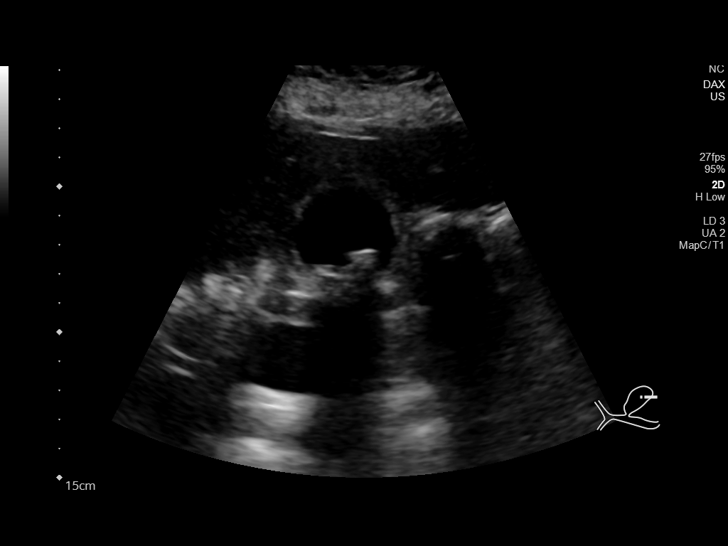
[im 19/49]
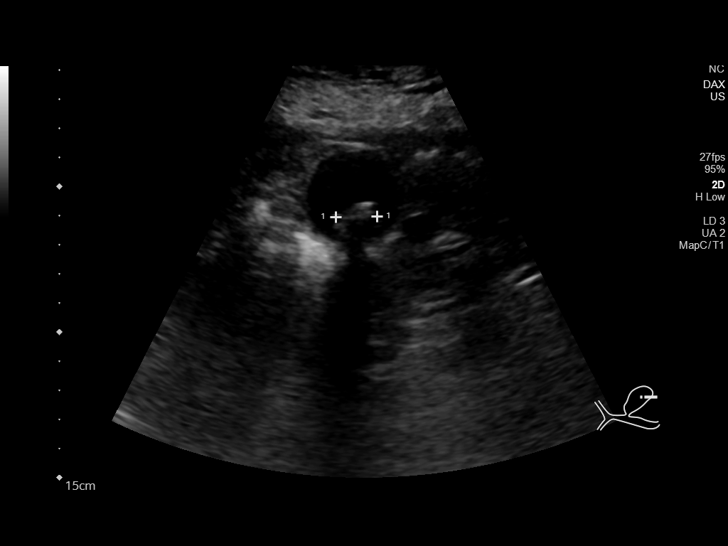
[im 23/49]
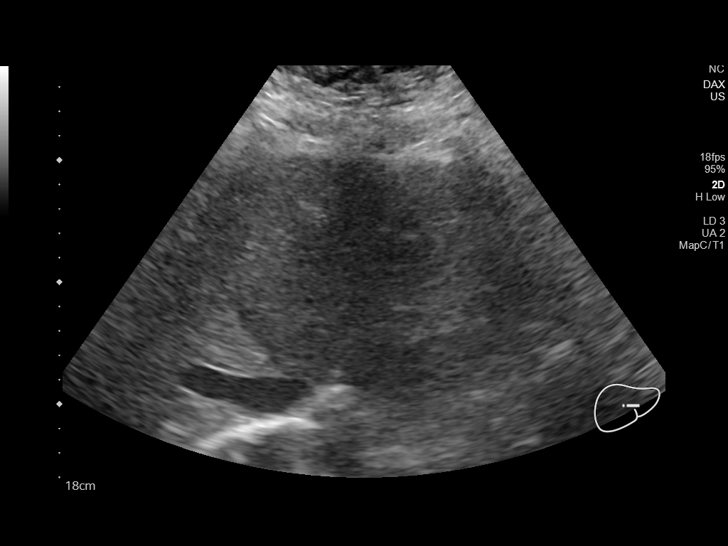
[im 27/49]
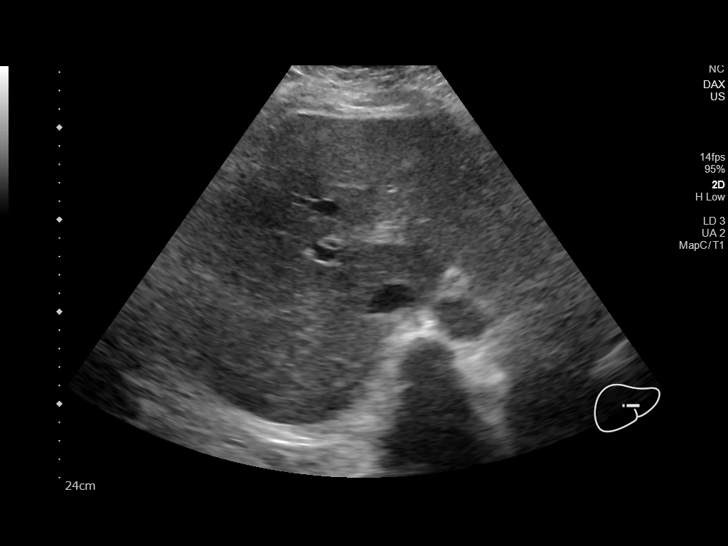
[im 31/49]
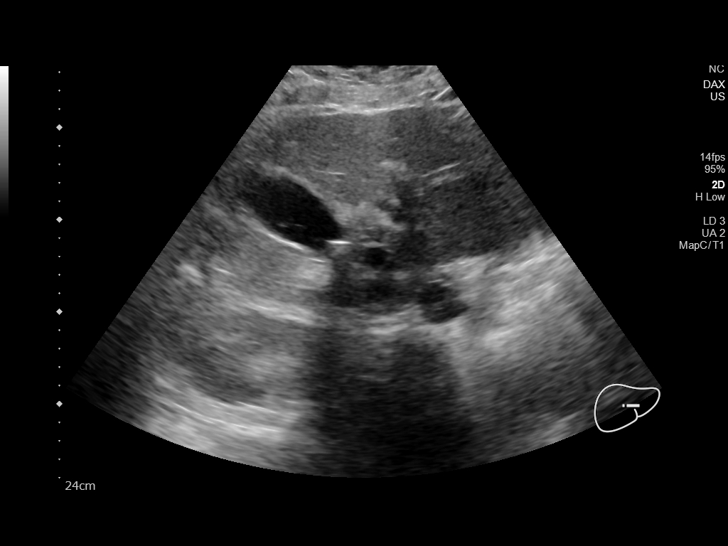
[im 33/49]
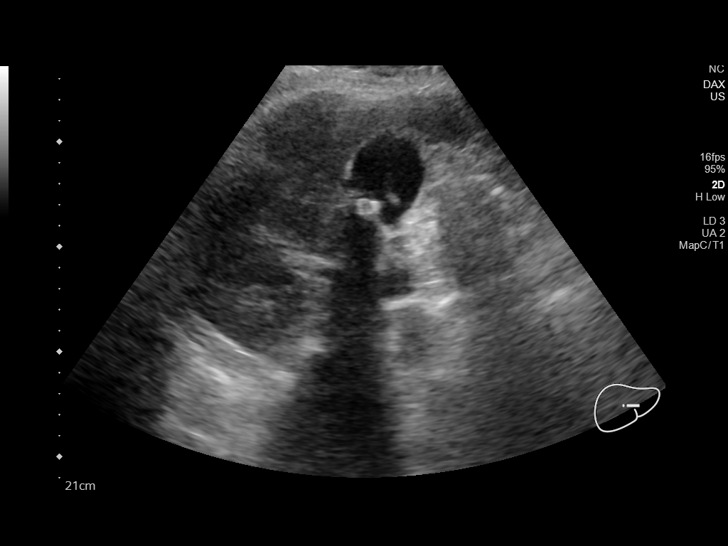
[im 37/49]
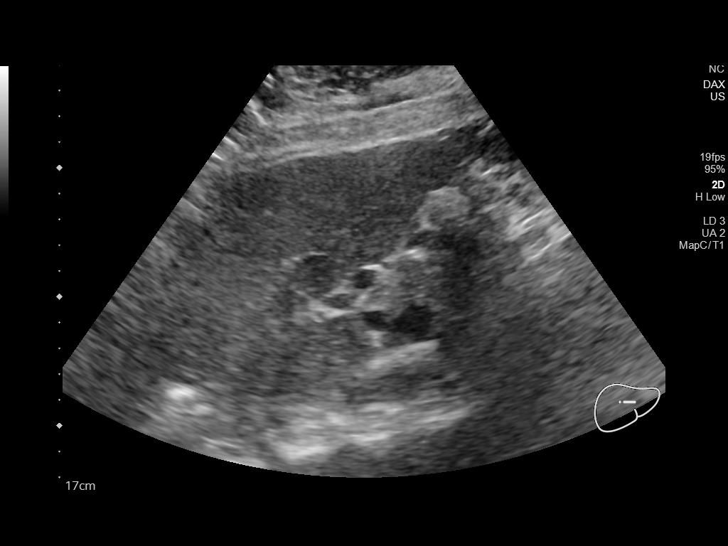
[im 41/49]
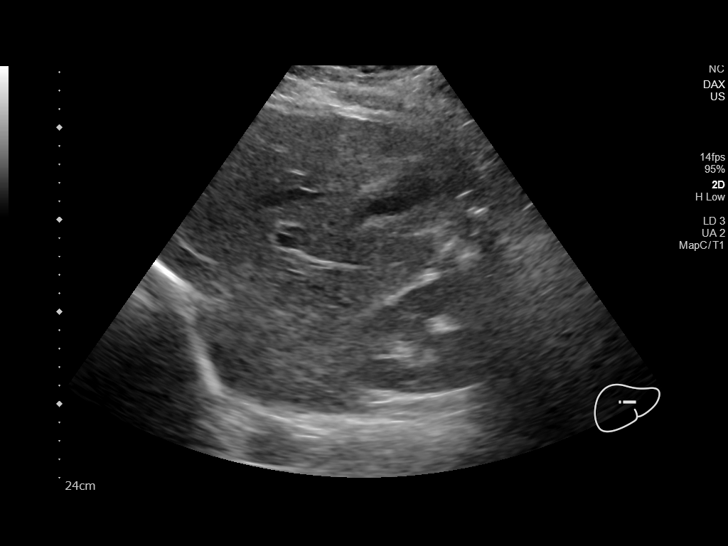
[im 45/49]
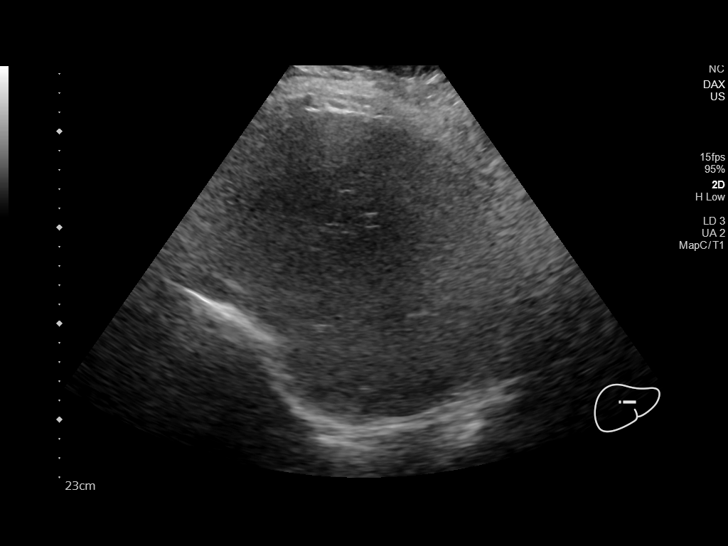
[im 49/49]
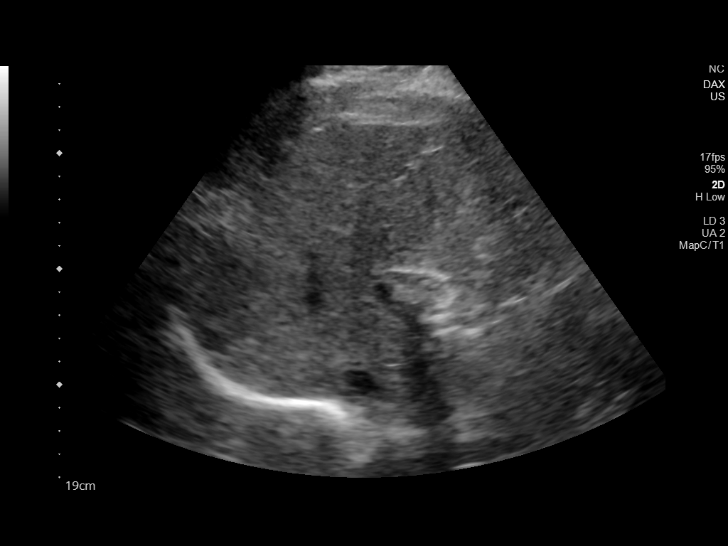

[14 of 25 positions shown; findings below may reference images not displayed]

FINDINGS: Gallbladder:

Numerous echogenic shadowing gallstones. The largest gallstone
measures 14 mm. No gallbladder wall thickening, pericholecystic
fluid or sonographic Murphy sign to suggest acute cholecystitis.

Common bile duct:

Diameter: 5.5 mm

Liver:

Normal echogenicity without focal lesion or biliary dilatation.
Portal vein is patent on color Doppler imaging with normal direction
of blood flow towards the liver.

Other: None.
IMPRESSION: 1. Cholelithiasis without sonographic findings for acute
cholecystitis.
2. Top-normal common bile duct dilatation at 5.5 mm.
3. Normal liver.

## 2021-06-06 ENCOUNTER — Telehealth: Payer: Self-pay | Admitting: General Practice

## 2021-06-06 NOTE — Telephone Encounter (Signed)
Patient called into front office stating she is concerned she could be pregnant because she has been cramping for the past two weeks and cannot feel her strings. She reports getting a monthly period that is just spotting for several days. LMP 12/8. Patient also states she has never been able to feel her IUD strings so she is concerned whether or not her IUD is present and if she could be pregnant. Discussed with patient the likelihood her IUD has fallen out is unlikely since she has had it for 2 years now and just had her regular period last week. Offered work in appt for tomorrow at 115. Discussed with patient she could also do a UPT at home or in the office tomorrow but home tests work just as well. Advised patient she will still need to return in February for annual exam with pap as currently scheduled. Patient verbalized understanding to all.

## 2021-06-07 ENCOUNTER — Ambulatory Visit: Payer: 59 | Admitting: Certified Nurse Midwife

## 2021-06-19 ENCOUNTER — Ambulatory Visit (HOSPITAL_COMMUNITY)
Admission: EM | Admit: 2021-06-19 | Discharge: 2021-06-19 | Disposition: A | Discharge status: Home / Self Care | Payer: 59 | Attending: Physician Assistant | Admitting: Physician Assistant

## 2021-06-19 ENCOUNTER — Encounter (HOSPITAL_COMMUNITY): Payer: Self-pay

## 2021-06-19 ENCOUNTER — Other Ambulatory Visit: Payer: Self-pay

## 2021-06-19 DIAGNOSIS — Z8619 Personal history of other infectious and parasitic diseases: Secondary | ICD-10-CM | POA: Insufficient documentation

## 2021-06-19 DIAGNOSIS — R0602 Shortness of breath: Secondary | ICD-10-CM | POA: Diagnosis not present

## 2021-06-19 DIAGNOSIS — R5381 Other malaise: Secondary | ICD-10-CM | POA: Insufficient documentation

## 2021-06-19 DIAGNOSIS — B001 Herpesviral vesicular dermatitis: Secondary | ICD-10-CM | POA: Diagnosis not present

## 2021-06-19 DIAGNOSIS — R051 Acute cough: Secondary | ICD-10-CM | POA: Diagnosis present

## 2021-06-19 DIAGNOSIS — Z20822 Contact with and (suspected) exposure to covid-19: Secondary | ICD-10-CM | POA: Insufficient documentation

## 2021-06-19 DIAGNOSIS — Z113 Encounter for screening for infections with a predominantly sexual mode of transmission: Secondary | ICD-10-CM | POA: Diagnosis not present

## 2021-06-19 DIAGNOSIS — Z28310 Unvaccinated for covid-19: Secondary | ICD-10-CM | POA: Insufficient documentation

## 2021-06-19 DIAGNOSIS — N898 Other specified noninflammatory disorders of vagina: Secondary | ICD-10-CM | POA: Diagnosis not present

## 2021-06-19 DIAGNOSIS — Z711 Person with feared health complaint in whom no diagnosis is made: Secondary | ICD-10-CM

## 2021-06-19 DIAGNOSIS — J4521 Mild intermittent asthma with (acute) exacerbation: Secondary | ICD-10-CM

## 2021-06-19 DIAGNOSIS — Z202 Contact with and (suspected) exposure to infections with a predominantly sexual mode of transmission: Secondary | ICD-10-CM | POA: Insufficient documentation

## 2021-06-19 DIAGNOSIS — R0981 Nasal congestion: Secondary | ICD-10-CM | POA: Diagnosis not present

## 2021-06-19 DIAGNOSIS — R5383 Other fatigue: Secondary | ICD-10-CM | POA: Insufficient documentation

## 2021-06-19 LAB — POC INFLUENZA A AND B ANTIGEN (URGENT CARE ONLY)
INFLUENZA A ANTIGEN, POC: NEGATIVE
INFLUENZA B ANTIGEN, POC: NEGATIVE

## 2021-06-19 LAB — POC URINE PREG, ED: Preg Test, Ur: NEGATIVE

## 2021-06-19 LAB — HIV ANTIBODY (ROUTINE TESTING W REFLEX): HIV Screen 4th Generation wRfx: NONREACTIVE

## 2021-06-19 LAB — HEPATITIS C ANTIBODY: HCV Ab: NONREACTIVE

## 2021-06-19 MED ORDER — CEFTRIAXONE SODIUM 1 G IJ SOLR
INTRAMUSCULAR | Status: AC
  Filled 2021-06-19: qty 10

## 2021-06-19 MED ORDER — ALBUTEROL SULFATE HFA 108 (90 BASE) MCG/ACT IN AERS
2.0000 | INHALATION_SPRAY | Freq: Once | RESPIRATORY_TRACT | Status: AC
  Administered 2021-06-19: 19:00:00 2 via RESPIRATORY_TRACT

## 2021-06-19 MED ORDER — VALACYCLOVIR HCL 1 G PO TABS: 1000.0000 mg | ORAL_TABLET | Freq: Two times a day (BID) | ORAL | 0 refills | Status: AC

## 2021-06-19 MED ORDER — ALBUTEROL SULFATE HFA 108 (90 BASE) MCG/ACT IN AERS
INHALATION_SPRAY | RESPIRATORY_TRACT | Status: AC
  Filled 2021-06-19: qty 6.7

## 2021-06-19 MED ORDER — PREDNISONE 10 MG (21) PO TBPK: ORAL_TABLET | ORAL | 0 refills | Status: DC

## 2021-06-19 MED ORDER — LIDOCAINE HCL (PF) 1 % IJ SOLN
INTRAMUSCULAR | Status: AC
  Filled 2021-06-19: qty 2

## 2021-06-19 MED ORDER — CEFTRIAXONE SODIUM 1 G IJ SOLR: 1.0000 g | Freq: Once | INTRAMUSCULAR | Status: DC

## 2021-06-19 NOTE — Discharge Instructions (Signed)
Urine negative for flu.  We will contact you if you are positive for COVID.  Use albuterol inhaler every 4-6 hours as needed.  Start prednisone taper.  Do not take NSAIDs including aspirin, ibuprofen/Advil, naproxen/Aleve with this medication as it can cause stomach bleeding.  Use Mucinex, Tylenol, Flonase for additional symptom relief.  If you have any worsening symptoms including worsening cough, high fever not responding to medication, chest pain, increased shortness of breath you need to be seen immediately.  If symptoms or not improving within a week follow-up with either our clinic or your PCP.  Use Valtrex for cold sore as prescribed.

## 2021-06-19 NOTE — ED Triage Notes (Signed)
Pt presnents for STD and COVID testing.   Pt states she woke up with vaginal odor and a sore on her lip after intercourse with her partner.   States she has been coughing and had an asthma attack. Pt reports she uses an inhaler as needed.

## 2021-06-19 NOTE — ED Provider Notes (Signed)
[facility]    CSN: [id number] Arrival date & time: 06/19/21  1714      History   Chief Complaint Chief Complaint  Patient presents with   Shortness of Breath   vaginal odor   Cough   Blister    HPI Anna King is a 40 y.o. female.   Patient presents today with multiple concerns.  Her primary concern today is a 2-day history of URI symptoms.  She reports cough, nasal congestion, fatigue, malaise, shortness of breath.  Denies any chest pain, nausea, vomiting, fever, body aches.  Denies any known sick contacts.  She believes symptoms are related to an asthma flare as it began after she was exposed to smoke at a party.  She does not have albuterol inhaler available but is requesting this today.  She reports that cold weather and talking/laughing has been triggering symptoms.  She has not had influenza or COVID-19 vaccine.  Denies any recent antibiotics.  She has not taken any over-the-counter medication for symptom management.  She has developed a cold sore and is requesting Valtrex treatment for this if appropriate today.  In addition, patient is interested in STI testing.  She reports history of gonorrhea and chlamydia and believes that she has completed treatment.  She does report being sexually active with the same partner but is unsure whether they have received adequate treatment.  She does report some vaginal discharge but denies any additional symptoms including pelvic pain, abdominal pain, fever, nausea, vomiting.  Denies any recent antibiotic use.  Denies any changes to personal hygiene products.  She is interested in complete STI panel.   Past Medical History:  Diagnosis Date   Asthma    BV (bacterial vaginosis)    Chlamydia    Hypertension     Patient Active Problem List   Diagnosis Date Noted   Gallstones 04/22/2019   BMI 50.0-59.9, adult (HCC) 10/22/2018   Essential hypertension 02/03/2018    Past Surgical History:  Procedure Laterality Date    CHOLECYSTECTOMY N/A 04/22/2019   Procedure: LAPAROSCOPIC CHOLECYSTECTOMY;  Surgeon: Griselda Miner, MD;  Location: [facility] OR;  Service: General;  Laterality: N/A;   INTRAOPERATIVE CHOLANGIOGRAM N/A 04/22/2019   Procedure: Intraoperative Cholangiogram;  Surgeon: Griselda Miner, MD;  Location: [facility] OR;  Service: General;  Laterality: N/A;   WISDOM TOOTH EXTRACTION      OB History     Gravida  3   Para  3   Term  2   Preterm  1   AB      Living  3      SAB      IAB      Ectopic      Multiple      Live Births  3            Home Medications    Prior to Admission medications   Medication Sig Start Date End Date Taking? Authorizing Provider  predniSONE (STERAPRED UNI-PAK 21 TAB) 10 MG (21) TBPK tablet As directed 06/19/21  Yes Shelvy Perazzo K, PA-C  valACYclovir (VALTREX) 1000 MG tablet Take 1 tablet (1,000 mg total) by mouth 2 (two) times daily for 5 days. 06/19/21 06/24/21 Yes Davy Faught K, PA-C  amLODipine (NORVASC) 10 MG tablet Take 10 mg by mouth daily. 04/12/20   [provider]  atenolol (TENORMIN) 100 MG tablet Take 100 mg by mouth daily.    [provider]  doxycycline (VIBRAMYCIN) 100 MG capsule Take 1 capsule (  100 mg total) by mouth 2 (two) times daily. 01/24/21   Meccariello, Solmon Ice, DO  hydrochlorothiazide (HYDRODIURIL) 25 MG tablet Take 25 mg by mouth daily. 04/12/20   [provider]  meloxicam (MOBIC) 7.5 MG tablet Take 1 tablet (7.5 mg total) by mouth daily. 09/02/20   Mesner, Barbara Cower, MD  methocarbamol (ROBAXIN) 500 MG tablet Take 1 tablet (500 mg total) by mouth every 8 (eight) hours as needed for muscle spasms. 09/02/20   Mesner, Barbara Cower, MD    Family History Family History  Problem Relation Age of Onset   Diabetes Mother    Hypertension Mother     Social History Social History   Tobacco Use   Smoking status: Never   Smokeless tobacco: Never  Vaping Use   Vaping Use: Never used  Substance Use Topics   Alcohol use: Yes     Alcohol/week: 2.0 standard drinks    Types: 2 Glasses of wine per week    Comment: occasional   Drug use: No     Allergies   Shellfish allergy and Betadine [povidone iodine]   Review of Systems Review of Systems  Constitutional:  Positive for activity change and fatigue. Negative for appetite change and fever.  HENT:  Positive for congestion. Negative for sinus pressure, sneezing and sore throat.   Respiratory:  Positive for cough, chest tightness and shortness of breath.   Cardiovascular:  Negative for chest pain.  Gastrointestinal:  Negative for abdominal pain, diarrhea, nausea and vomiting.  Genitourinary:  Positive for vaginal discharge. Negative for vaginal bleeding and vaginal pain.  Neurological:  Negative for dizziness, light-headedness and headaches.    Physical Exam Triage Vital Signs ED Triage Vitals  Enc Vitals Group     BP 06/19/21 1815 (!) 143/106     Pulse Rate 06/19/21 1815 68     Resp 06/19/21 1815 19     Temp 06/19/21 1815 97.6 F (36.4 C)     Temp Source 06/19/21 1815 Oral     SpO2 06/19/21 1815 97 %     Weight --      Height --      Head Circumference --      Peak Flow --      Pain Score 06/19/21 1813 0     Pain Loc --      Pain Edu? --      Excl. in GC? --    No data found.  Updated Vital Signs BP (!) 143/106 (BP Location: Right Arm)    Pulse 68    Temp 97.6 F (36.4 C) (Oral)    Resp 19    LMP 06/14/2021 (Exact Date)    SpO2 97%   Visual Acuity Right Eye Distance:   Left Eye Distance:   Bilateral Distance:    Right Eye Near:   Left Eye Near:    Bilateral Near:     Physical Exam Vitals reviewed.  Constitutional:      General: She is awake. She is not in acute distress.    Appearance: Normal appearance. She is well-developed. She is not ill-appearing.     Comments: Very pleasant female appears stated age in no acute distress sitting comfortably in exam room  HENT:     Head: Normocephalic and atraumatic.     Right Ear: Tympanic  membrane, ear canal and external ear normal. Tympanic membrane is not erythematous or bulging.     Left Ear: Tympanic membrane, ear canal and external ear normal. Tympanic membrane is not  erythematous or bulging.     Nose:     Right Sinus: No maxillary sinus tenderness or frontal sinus tenderness.     Left Sinus: No maxillary sinus tenderness or frontal sinus tenderness.     Mouth/Throat:     Lips: Lesions present.     Pharynx: Uvula midline. No oropharyngeal exudate or posterior oropharyngeal erythema.     Comments: Ulcerated lesion noted on upper lip Cardiovascular:     Rate and Rhythm: Normal rate and regular rhythm.     Heart sounds: Normal heart sounds, S1 normal and S2 normal. No murmur heard. Pulmonary:     Effort: Pulmonary effort is normal.     Breath sounds: Wheezing present. No rhonchi or rales.     Comments: Scattered wheezing and reactive cough with deep breathing Abdominal:     General: Bowel sounds are normal.     Palpations: Abdomen is soft.     Tenderness: There is no right CVA tenderness, left CVA tenderness, guarding or rebound.  Genitourinary:    Comments: Exam deferred Psychiatric:        Behavior: Behavior is cooperative.     UC Treatments / Results  Labs (all labs ordered are listed, but only abnormal results are displayed) Labs Reviewed  SARS CORONAVIRUS 2 (TAT 6-24 HRS)  HIV ANTIBODY (ROUTINE TESTING W REFLEX)  HEPATITIS C ANTIBODY  RPR  POC INFLUENZA A AND B ANTIGEN (URGENT CARE ONLY)  POC URINE PREG, ED  CERVICOVAGINAL ANCILLARY ONLY    EKG   Radiology No results found.  Procedures Procedures (including critical care time)  Medications Ordered in UC Medications  cefTRIAXone (ROCEPHIN) injection 1 g (has no administration in time range)  albuterol (VENTOLIN HFA) 108 (90 Base) MCG/ACT inhaler 2 puff (2 puffs Inhalation Given 06/19/21 1840)    Initial Impression / Assessment and Plan / UC Course  I have reviewed the triage vital signs  and the nursing notes.  Pertinent labs & imaging results that were available during my care of the patient were reviewed by me and considered in my medical decision making (see chart for details).     Suspect asthma exacerbation triggered by viral illness given clinical presentation.  Patient had improvement of symptoms with albuterol in clinic and was sent home with inhaler with instruction to use this every 4-6 hours as needed.  Flu testing was negative.  COVID test is pending.  She was provided work excuse note with current CDC return to work guidelines based on COVID test result.  She was started on prednisone taper with instruction not to take NSAIDs with this medication due to risk of GI bleeding.  Can use Tylenol, Mucinex, Flonase for additional symptom relief.  She is to rest and drink plenty of fluid.  Discussed alarm symptoms that warrant emergent evaluation including persistent cough, increased shortness of breath, symptoms resistant to albuterol, fever, nausea, vomiting.  Strict return precautions given to which she expressed understanding.  STI testing was obtained today-results pending.  Patient requested treatment given likely exposure.  She was given 1 g of Rocephin in clinic to cover for gonorrhea based on BMI today, however, discussed that we will defer additional treatment until swab and blood testing results are obtained.  Discussed the importance of safe sex practices and she is to abstain from sex for 7 days after completing treatment.  Discussed alarm symptoms that warrant emergent evaluation including pelvic pain, abdominal pain, nausea, vomiting.  She return precautions given to which she expressed understanding.  Cold sore noted on exam.  Patient was started on Valtrex for treatment.  Final Clinical Impressions(s) / UC Diagnoses   Final diagnoses:  Mild intermittent asthma with acute exacerbation  Acute cough  Cold sore  Routine screening for STI (sexually transmitted  infection)  Vaginal discharge  Concern about STD in female without diagnosis     Discharge Instructions      Urine negative for flu.  We will contact you if you are positive for COVID.  Use albuterol inhaler every 4-6 hours as needed.  Start prednisone taper.  Do not take NSAIDs including aspirin, ibuprofen/Advil, naproxen/Aleve with this medication as it can cause stomach bleeding.  Use Mucinex, Tylenol, Flonase for additional symptom relief.  If you have any worsening symptoms including worsening cough, high fever not responding to medication, chest pain, increased shortness of breath you need to be seen immediately.  If symptoms or not improving within a week follow-up with either our clinic or your PCP.  Use Valtrex for cold sore as prescribed.     ED Prescriptions     Medication Sig Dispense Auth. Provider   valACYclovir (VALTREX) 1000 MG tablet Take 1 tablet (1,000 mg total) by mouth 2 (two) times daily for 5 days. 10 tablet Emmanuel Ercole K, PA-C   predniSONE (STERAPRED UNI-PAK 21 TAB) 10 MG (21) TBPK tablet As directed 21 tablet Jaisean Monteforte K, PA-C      PDMP not reviewed this encounter.   Jeani Hawking, PA-C 06/19/21 1944

## 2021-06-20 LAB — SARS CORONAVIRUS 2 (TAT 6-24 HRS): SARS Coronavirus 2: NEGATIVE

## 2021-06-20 LAB — CERVICOVAGINAL ANCILLARY ONLY
Bacterial Vaginitis (gardnerella): NEGATIVE
Candida Glabrata: NEGATIVE
Candida Vaginitis: NEGATIVE
Chlamydia: NEGATIVE
Comment: NEGATIVE
Comment: NEGATIVE
Comment: NEGATIVE
Comment: NEGATIVE
Comment: NEGATIVE
Comment: NORMAL
Neisseria Gonorrhea: NEGATIVE
Trichomonas: NEGATIVE

## 2021-06-20 LAB — RPR: RPR Ser Ql: NONREACTIVE

## 2021-08-13 ENCOUNTER — Encounter: Payer: Self-pay | Admitting: Nurse Practitioner

## 2021-08-13 DIAGNOSIS — Z975 Presence of (intrauterine) contraceptive device: Secondary | ICD-10-CM | POA: Insufficient documentation

## 2021-08-14 ENCOUNTER — Ambulatory Visit: Payer: 59 | Admitting: Nurse Practitioner

## 2022-03-21 ENCOUNTER — Encounter (HOSPITAL_COMMUNITY): Payer: Self-pay

## 2022-03-21 ENCOUNTER — Ambulatory Visit (HOSPITAL_COMMUNITY)
Admission: EM | Admit: 2022-03-21 | Discharge: 2022-03-21 | Disposition: A | Discharge status: Home / Self Care | Payer: 59 | Attending: Family Medicine | Admitting: Family Medicine

## 2022-03-21 DIAGNOSIS — I10 Essential (primary) hypertension: Secondary | ICD-10-CM | POA: Diagnosis present

## 2022-03-21 DIAGNOSIS — N898 Other specified noninflammatory disorders of vagina: Secondary | ICD-10-CM | POA: Insufficient documentation

## 2022-03-21 DIAGNOSIS — Z711 Person with feared health complaint in whom no diagnosis is made: Secondary | ICD-10-CM | POA: Insufficient documentation

## 2022-03-21 MED ORDER — CEFTRIAXONE SODIUM 500 MG IJ SOLR
500.0000 mg | Freq: Once | INTRAMUSCULAR | Status: AC
  Administered 2022-03-21: 500 mg via INTRAMUSCULAR

## 2022-03-21 MED ORDER — CEFTRIAXONE SODIUM 500 MG IJ SOLR
INTRAMUSCULAR | Status: AC
  Filled 2022-03-21: qty 500

## 2022-03-21 MED ORDER — DOXYCYCLINE HYCLATE 100 MG PO CAPS: 100.0000 mg | ORAL_CAPSULE | Freq: Two times a day (BID) | ORAL | 0 refills | Status: DC

## 2022-03-21 MED ORDER — LIDOCAINE HCL (PF) 1 % IJ SOLN
INTRAMUSCULAR | Status: AC
  Filled 2022-03-21: qty 2

## 2022-03-21 NOTE — ED Triage Notes (Signed)
Pt states her partner is cheating on her. Pt c/o vaginal odor x3 days. Requesting STD testing.

## 2022-03-21 NOTE — Discharge Instructions (Signed)
You have been given the following today for treatment of suspected gonorrhea and/or chlamydia:  cefTRIAXone (ROCEPHIN) injection 500 mg  Please pick up your prescription for doxycycline 100 mg and begin taking twice daily for the next seven (7) days.  Even though we have treated you today, we have sent testing for sexually transmitted infections. We will notify you of any positive results once they are received. If required, we will prescribe any medications you might need.  Please refrain from all sexual activity for at least the next seven days.  Your blood pressure was noted to be elevated during your visit today. If you are currently taking medication for high blood pressure, please ensure you are taking this as directed. If you do not have a history of high blood pressure and your blood pressure remains persistently elevated, you may need to begin taking a medication at some point. You may return here within the next few days to recheck if unable to see your primary care provider or if you do not have a one.  BP (!) 194/119 (BP Location: Left Arm)   Pulse 79   Temp 97.9 F (36.6 C) (Oral)   Resp 18   SpO2 96%   Breastfeeding No   BP Readings from Last 3 Encounters:  03/21/22 (!) 194/119  06/19/21 (!) 143/106  01/24/21 (!) 150/107

## 2022-03-22 ENCOUNTER — Telehealth (HOSPITAL_COMMUNITY): Payer: Self-pay | Admitting: Emergency Medicine

## 2022-03-22 LAB — CERVICOVAGINAL ANCILLARY ONLY
Bacterial Vaginitis (gardnerella): POSITIVE — AB
Candida Glabrata: NEGATIVE
Candida Vaginitis: NEGATIVE
Chlamydia: NEGATIVE
Comment: NEGATIVE
Comment: NEGATIVE
Comment: NEGATIVE
Comment: NEGATIVE
Comment: NEGATIVE
Comment: NORMAL
Neisseria Gonorrhea: NEGATIVE
Trichomonas: NEGATIVE

## 2022-03-22 MED ORDER — METRONIDAZOLE 0.75 % VA GEL: 1.0000 | Freq: Every day | VAGINAL | 0 refills | Status: AC

## 2022-03-22 NOTE — ED Provider Notes (Signed)
[facility] CARE CENTER   [id number] 03/21/22 Arrival Time: 1804  ASSESSMENT & PLAN:  1. Vaginal discharge   2. Concern about STD in female without diagnosis   3. Elevated blood pressure reading in office with diagnosis of hypertension       Discharge Instructions      You have been given the following today for treatment of suspected gonorrhea and/or chlamydia:  cefTRIAXone (ROCEPHIN) injection 500 mg  Please pick up your prescription for doxycycline 100 mg and begin taking twice daily for the next seven (7) days.  Even though we have treated you today, we have sent testing for sexually transmitted infections. We will notify you of any positive results once they are received. If required, we will prescribe any medications you might need.  Please refrain from all sexual activity for at least the next seven days.  Your blood pressure was noted to be elevated during your visit today. If you are currently taking medication for high blood pressure, please ensure you are taking this as directed. If you do not have a history of high blood pressure and your blood pressure remains persistently elevated, you may need to begin taking a medication at some point. You may return here within the next few days to recheck if unable to see your primary care provider or if you do not have a one.  BP (!) 194/119 (BP Location: Left Arm)   Pulse 79   Temp 97.9 F (36.6 C) (Oral)   Resp 18   SpO2 96%   Breastfeeding No   BP Readings from Last 3 Encounters:  03/21/22 (!) 194/119  06/19/21 (!) 143/106  01/24/21 (!) 150/107     No si/s of HTN urgency. Without s/s of PID.  Labs Reviewed  CERVICOVAGINAL ANCILLARY ONLY   Will notify of any positive results. Instructed to refrain from sexual activity for at least seven days.   Follow-up Information     Quitman Livings, MD.   Specialty: Internal Medicine Why: To discuss your blood pressure. Contact information: [address] [place].  [address] [place] [address] [phone]                 Reviewed expectations re: course of current medical issues. Questions answered. Outlined signs and symptoms indicating need for more acute intervention. Patient verbalized understanding. After Visit Summary given.   SUBJECTIVE:  Anna King is a 41 y.o. female who presents with complaint of vaginal discharge and odor. Pt states her partner is cheating on her. Pt c/o vaginal odor x3 days. Requesting STD testing.  Increased blood pressure noted today. Reports that she is treated for HTN. She reports taking medications as instructed, no chest pain on exertion, no dyspnea on exertion, no swelling of ankles, no orthostatic dizziness or lightheadedness, no orthopnea or paroxysmal nocturnal dyspnea, and no palpitations.   OBJECTIVE:  Vitals:   03/21/22 1902  BP: (!) 194/119  Pulse: 79  Resp: 18  Temp: 97.9 F (36.6 C)  TempSrc: Oral  SpO2: 96%    General appearance: alert, cooperative, appears stated age and no distress Lungs: unlabored respirations; speaks full sentences without difficulty Back: no CVA tenderness; FROM at waist CV: reg Abdomen: soft, non-tender GU: deferred Skin: warm and dry Psychological: alert and cooperative; normal mood and affect.   Labs Reviewed  CERVICOVAGINAL ANCILLARY ONLY    Allergies  Allergen Reactions   Shellfish Allergy Anaphylaxis   Betadine [Povidone Iodine] Itching and Rash    Past Medical History:  Diagnosis  Date   Asthma    BV (bacterial vaginosis)    Chlamydia    Hypertension    Family History  Problem Relation Age of Onset   Diabetes Mother    Hypertension Mother    Social History   Socioeconomic History   Marital status: Single    Spouse name: Not on file   Number of children: Not on file   Years of education: Not on file   Highest education level: Not on file  Occupational History   Not on file  Tobacco Use   Smoking status: Never   Smokeless  tobacco: Never  Vaping Use   Vaping Use: Never used  Substance and Sexual Activity   Alcohol use: Yes    Alcohol/week: 2.0 standard drinks of alcohol    Types: 2 Glasses of wine per week    Comment: occasional   Drug use: No   Sexual activity: Yes    Birth control/protection: I.U.D.  Other Topics Concern   Not on file  Social History Narrative   Not on file   Social Determinants of Health   Financial Resource Strain: Not on file  Food Insecurity: Food Insecurity Present (07/02/2018)   Hunger Vital Sign    Worried About Running Out of Food in the Last Year: Never true    Ran Out of Food in the Last Year: Sometimes true  Transportation Needs: Unmet Transportation Needs (07/02/2018)   PRAPARE - Hydrologist (Medical): Yes    Lack of Transportation (Non-Medical): No  Physical Activity: Not on file  Stress: Not on file  Social Connections: Not on file  Intimate Partner Violence: Not At Risk (11/18/2018)   Humiliation, Afraid, Rape, and Kick questionnaire    Fear of Current or Ex-Partner: No    Emotionally Abused: No    Physically Abused: No    Sexually Abused: No           Vanessa Kick, MD 03/22/22 1023

## 2022-07-03 ENCOUNTER — Encounter (HOSPITAL_COMMUNITY): Payer: Self-pay

## 2022-07-03 ENCOUNTER — Other Ambulatory Visit: Payer: Self-pay

## 2022-07-03 ENCOUNTER — Emergency Department (HOSPITAL_COMMUNITY)
Admission: EM | Admit: 2022-07-03 | Discharge: 2022-07-03 | Discharge status: Against Medical Advice | Payer: 59 | Attending: Emergency Medicine | Admitting: Emergency Medicine

## 2022-07-03 DIAGNOSIS — R5383 Other fatigue: Secondary | ICD-10-CM | POA: Insufficient documentation

## 2022-07-03 DIAGNOSIS — Z5321 Procedure and treatment not carried out due to patient leaving prior to being seen by health care provider: Secondary | ICD-10-CM | POA: Diagnosis not present

## 2022-07-03 DIAGNOSIS — R059 Cough, unspecified: Secondary | ICD-10-CM | POA: Diagnosis not present

## 2022-07-03 DIAGNOSIS — R1084 Generalized abdominal pain: Secondary | ICD-10-CM | POA: Insufficient documentation

## 2022-07-03 DIAGNOSIS — R630 Anorexia: Secondary | ICD-10-CM | POA: Insufficient documentation

## 2022-07-03 DIAGNOSIS — R110 Nausea: Secondary | ICD-10-CM | POA: Diagnosis not present

## 2022-07-03 DIAGNOSIS — Z1152 Encounter for screening for COVID-19: Secondary | ICD-10-CM | POA: Insufficient documentation

## 2022-07-03 LAB — CBC
HCT: 45 % (ref 36.0–46.0)
Hemoglobin: 14.2 g/dL (ref 12.0–15.0)
MCH: 26 pg (ref 26.0–34.0)
MCHC: 31.6 g/dL (ref 30.0–36.0)
MCV: 82.4 fL (ref 80.0–100.0)
Platelets: 316 10*3/uL (ref 150–400)
RBC: 5.46 MIL/uL — ABNORMAL HIGH (ref 3.87–5.11)
RDW: 16.1 % — ABNORMAL HIGH (ref 11.5–15.5)
WBC: 9.9 10*3/uL (ref 4.0–10.5)
nRBC: 0 % (ref 0.0–0.2)

## 2022-07-03 LAB — URINALYSIS, ROUTINE W REFLEX MICROSCOPIC
Bilirubin Urine: NEGATIVE
Glucose, UA: NEGATIVE mg/dL
Ketones, ur: 5 mg/dL — AB
Nitrite: POSITIVE — AB
Protein, ur: 100 mg/dL — AB
Specific Gravity, Urine: 1.025 (ref 1.005–1.030)
WBC, UA: >50 WBC/hpf — ABNORMAL HIGH (ref 0–5)
pH: 6 (ref 5.0–8.0)

## 2022-07-03 LAB — COMPREHENSIVE METABOLIC PANEL
ALT: 14 U/L (ref 0–44)
AST: 19 U/L (ref 15–41)
Albumin: 3.7 g/dL (ref 3.5–5.0)
Alkaline Phosphatase: 88 U/L (ref 38–126)
Anion gap: 8 (ref 5–15)
BUN: 12 mg/dL (ref 6–20)
CO2: 24 mmol/L (ref 22–32)
Calcium: 8.7 mg/dL — ABNORMAL LOW (ref 8.9–10.3)
Chloride: 106 mmol/L (ref 98–111)
Creatinine, Ser: 0.91 mg/dL (ref 0.44–1.00)
GFR, Estimated: >60 mL/min (ref >60)
Glucose, Bld: 108 mg/dL — ABNORMAL HIGH (ref 70–99)
Potassium: 3.1 mmol/L — ABNORMAL LOW (ref 3.5–5.1)
Sodium: 138 mmol/L (ref 135–145)
Total Bilirubin: 0.3 mg/dL (ref 0.3–1.2)
Total Protein: 8.7 g/dL — ABNORMAL HIGH (ref 6.5–8.1)

## 2022-07-03 LAB — RESP PANEL BY RT-PCR (RSV, FLU A&B, COVID)  RVPGX2
Influenza A by PCR: NEGATIVE
Influenza B by PCR: NEGATIVE
Resp Syncytial Virus by PCR: NEGATIVE
SARS Coronavirus 2 by RT PCR: NEGATIVE

## 2022-07-03 LAB — I-STAT BETA HCG BLOOD, ED (MC, WL, AP ONLY): I-stat hCG, quantitative: <5 m[IU]/mL (ref <5)

## 2022-07-03 LAB — LIPASE, BLOOD: Lipase: 29 U/L (ref 11–51)

## 2022-07-03 NOTE — ED Triage Notes (Signed)
Patient BIB [facility]. Has been nauseous, fatigue, loss of appetite.

## 2022-07-03 NOTE — ED Notes (Signed)
Per registration, pt ambulated to window to c/o "feeling dizzy". Triage RN aware

## 2022-07-03 NOTE — ED Provider Triage Note (Signed)
Emergency Medicine Provider Triage Evaluation Note  Anna King , a 42 y.o. female  was evaluated in triage.  Pt complains of nausea, fatigue, loss of appetite.  Admits to cough.  Also endorses generalized abdominal pain.  Review of Systems  Positive: Nausea Negative: CP  Physical Exam  BP 123/83 (BP Location: Left Arm)   Pulse 97   Temp 97.9 F (36.6 C) (Oral)   Resp 14   SpO2 97%  Gen:   Awake, no distress   Resp:  Normal effort  MSK:   Moves extremities without difficulty  Other:    Medical Decision Making  Medically screening exam initiated at 1:22 PM.  Appropriate orders placed.  DEUNDRA FURBER was informed that the remainder of the evaluation will be completed by another provider, this initial triage assessment does not replace that evaluation, and the importance of remaining in the ED until their evaluation is complete.  Labs Respiratory panel   Suzy Bouchard, PA-C 07/03/22 1324

## 2022-07-04 ENCOUNTER — Encounter (HOSPITAL_COMMUNITY): Payer: Self-pay | Admitting: Emergency Medicine

## 2022-07-04 ENCOUNTER — Ambulatory Visit (HOSPITAL_COMMUNITY)
Admission: EM | Admit: 2022-07-04 | Discharge: 2022-07-04 | Disposition: A | Discharge status: Home / Self Care | Payer: 59 | Attending: Family Medicine | Admitting: Family Medicine

## 2022-07-04 DIAGNOSIS — J069 Acute upper respiratory infection, unspecified: Secondary | ICD-10-CM | POA: Diagnosis not present

## 2022-07-04 DIAGNOSIS — J4521 Mild intermittent asthma with (acute) exacerbation: Secondary | ICD-10-CM | POA: Diagnosis not present

## 2022-07-04 MED ORDER — PREDNISONE 20 MG PO TABS: 40.0000 mg | ORAL_TABLET | Freq: Every day | ORAL | 0 refills | Status: AC

## 2022-07-04 MED ORDER — POTASSIUM CHLORIDE ER 10 MEQ PO TBCR: 20.0000 meq | EXTENDED_RELEASE_TABLET | Freq: Two times a day (BID) | ORAL | 0 refills | Status: AC

## 2022-07-04 MED ORDER — ALBUTEROL SULFATE HFA 108 (90 BASE) MCG/ACT IN AERS: 2.0000 | INHALATION_SPRAY | RESPIRATORY_TRACT | 0 refills | Status: AC | PRN

## 2022-07-04 MED ORDER — ONDANSETRON 4 MG PO TBDP: 4.0000 mg | ORAL_TABLET | Freq: Three times a day (TID) | ORAL | 0 refills | Status: DC | PRN

## 2022-07-04 NOTE — Discharge Instructions (Signed)
At the emergency room your tests for flu, RSV, and COVID were all negative.  Your potassium was low at 3.1  Albuterol inhaler--do 2 puffs every 4 hours as needed for shortness of breath or wheezing  Take prednisone 20 mg--2 daily for 5 days  Ondansetron dissolved in the mouth every 8 hours as needed for nausea or vomiting. Clear liquids and bland things to eat.  Potassium chloride 10 milequivalents--take 2 tablets by mouth and repeat dose in another 10 to 12 hours.

## 2022-07-04 NOTE — ED Provider Notes (Signed)
[place]    CSN: [id number] Arrival date & time: 07/04/22  1140      History   Chief Complaint No chief complaint on file.   HPI Anna King is a 42 y.o. female.   HPI For dizziness and cough and congestion and wheezing.  Symptoms all started yesterday.  No fever so far.  Yesterday she had a little bit of abdominal pain and she also had thrown up a few times.  Today she is not feeling nauseated and she has not thrown up any today  The dizziness is better overall.  She went to [facility] emergency room by ambulance yesterday.  Labs were drawn and her potassium was 3.1.  Urinalysis was abnormal, but she is not having any dysuria or hematuria.  She is having some menstrual spotting.  No abdominal pain today.  Her test for flu, COVID, and RSV were all negative on PCR.  Of note she did have some vomiting from alcohol intake a couple of days ago.  He left the emergency department without specific treatment after waiting 6 hours  Past Medical History:  Diagnosis Date   Asthma    BV (bacterial vaginosis)    Chlamydia    Hypertension     Patient Active Problem List   Diagnosis Date Noted   Presence of IUD 08/13/2021   Gallstones 04/22/2019   BMI 50.0-59.9, adult ([facility]) 10/22/2018   Essential hypertension 02/03/2018    Past Surgical History:  Procedure Laterality Date   CHOLECYSTECTOMY N/A 04/22/2019   Procedure: LAPAROSCOPIC CHOLECYSTECTOMY;  Surgeon: Jovita Kussmaul, MD;  Location: [facility];  Service: General;  Laterality: N/A;   INTRAOPERATIVE CHOLANGIOGRAM N/A 04/22/2019   Procedure: Intraoperative Cholangiogram;  Surgeon: Jovita Kussmaul, MD;  Location: [facility];  Service: General;  Laterality: N/A;   WISDOM TOOTH EXTRACTION      OB History     Gravida  3   Para  3   Term  2   Preterm  1   AB      Living  3      SAB      IAB      Ectopic      Multiple      Live Births  3            Home Medications    Prior to Admission  medications   Medication Sig Start Date End Date Taking? Authorizing Provider  albuterol (VENTOLIN HFA) 108 (90 Base) MCG/ACT inhaler Inhale 2 puffs into the lungs every 4 (four) hours as needed for wheezing or shortness of breath. 07/04/22  Yes Barrett Henle, MD  ondansetron (ZOFRAN-ODT) 4 MG disintegrating tablet Take 1 tablet (4 mg total) by mouth every 8 (eight) hours as needed for nausea or vomiting. 07/04/22  Yes Taygen Newsome, Gwenlyn Perking, MD  potassium chloride (KLOR-CON) 10 MEQ tablet Take 2 tablets (20 mEq total) by mouth 2 (two) times daily for 2 doses. 07/04/22 07/05/22 Yes Greogry Goodwyn, Gwenlyn Perking, MD  predniSONE (DELTASONE) 20 MG tablet Take 2 tablets (40 mg total) by mouth daily with breakfast for 5 days. 07/04/22 07/09/22 Yes Barrett Henle, MD  amLODipine (NORVASC) 10 MG tablet Take 10 mg by mouth daily. 04/12/20   [provider]  atenolol (TENORMIN) 100 MG tablet Take 100 mg by mouth daily.    [provider]  hydrochlorothiazide (HYDRODIURIL) 25 MG tablet Take 25 mg by mouth daily. 04/12/20   [provider]  Family History Family History  Problem Relation Age of Onset   Diabetes Mother    Hypertension Mother     Social History Social History   Tobacco Use   Smoking status: Never   Smokeless tobacco: Never  Vaping Use   Vaping Use: Never used  Substance Use Topics   Alcohol use: Yes    Alcohol/week: 2.0 standard drinks of alcohol    Types: 2 Glasses of wine per week    Comment: occasional   Drug use: No     Allergies   Shellfish allergy and Betadine [povidone iodine]   Review of Systems Review of Systems   Physical Exam Triage Vital Signs ED Triage Vitals  Enc Vitals Group     BP 07/04/22 1251 (!) 143/94     Pulse Rate 07/04/22 1251 72     Resp 07/04/22 1251 17     Temp 07/04/22 1251 97.9 F (36.6 C)     Temp Source 07/04/22 1251 Oral     SpO2 07/04/22 1251 97 %     Weight --      Height --      Head Circumference --       Peak Flow --      Pain Score 07/04/22 1250 0     Pain Loc --      Pain Edu? --      Excl. in GC? --    No data found.  Updated Vital Signs BP (!) 143/94 (BP Location: Left Arm)   Pulse 72   Temp 97.9 F (36.6 C) (Oral)   Resp 17   LMP 07/03/2022   SpO2 97%   Visual Acuity Right Eye Distance:   Left Eye Distance:   Bilateral Distance:    Right Eye Near:   Left Eye Near:    Bilateral Near:     Physical Exam Vitals reviewed.  Constitutional:      General: She is not in acute distress.    Appearance: She is not toxic-appearing.  HENT:     Right Ear: Tympanic membrane and ear canal normal.     Left Ear: Tympanic membrane and ear canal normal.     Nose: Nose normal.     Mouth/Throat:     Mouth: Mucous membranes are moist.     Pharynx: No oropharyngeal exudate or posterior oropharyngeal erythema.  Eyes:     Extraocular Movements: Extraocular movements intact.     Conjunctiva/sclera: Conjunctivae normal.     Pupils: Pupils are equal, round, and reactive to light.  Cardiovascular:     Rate and Rhythm: Normal rate and regular rhythm.     Heart sounds: No murmur heard. Pulmonary:     Effort: Pulmonary effort is normal. No respiratory distress.     Breath sounds: No wheezing, rhonchi or rales.  Chest:     Chest wall: No tenderness.  Abdominal:     Palpations: Abdomen is soft.     Tenderness: There is no abdominal tenderness.  Musculoskeletal:     Cervical back: Neck supple.  Lymphadenopathy:     Cervical: No cervical adenopathy.  Skin:    Capillary Refill: Capillary refill takes less than 2 seconds.     Coloration: Skin is not jaundiced or pale.  Neurological:     General: No focal deficit present.     Mental Status: She is alert and oriented to person, place, and time.  Psychiatric:        Behavior: Behavior normal.  UC Treatments / Results  Labs (all labs ordered are listed, but only abnormal results are displayed) Labs Reviewed - No data to  display  EKG   Radiology No results found.  Procedures Procedures (including critical care time)  Medications Ordered in UC Medications - No data to display  Initial Impression / Assessment and Plan / UC Course  I have reviewed the triage vital signs and the nursing notes.  Pertinent labs & imaging results that were available during my care of the patient were reviewed by me and considered in my medical decision making (see chart for details).        Treatment is sent in for asthma exacerbation.  Potassium is sent and also for the low potassium.  I did discuss with her that she most likely has a viral upper respiratory infection that we do not test for. Final Clinical Impressions(s) / UC Diagnoses   Final diagnoses:  Viral URI  Mild intermittent asthma with acute exacerbation     Discharge Instructions      At the emergency room your tests for flu, RSV, and COVID were all negative.  Your potassium was low at 3.1  Albuterol inhaler--do 2 puffs every 4 hours as needed for shortness of breath or wheezing  Take prednisone 20 mg--2 daily for 5 days  Ondansetron dissolved in the mouth every 8 hours as needed for nausea or vomiting. Clear liquids and bland things to eat.  Potassium chloride 10 milequivalents--take 2 tablets by mouth and repeat dose in another 10 to 12 hours.     ED Prescriptions     Medication Sig Dispense Auth. Provider   albuterol (VENTOLIN HFA) 108 (90 Base) MCG/ACT inhaler Inhale 2 puffs into the lungs every 4 (four) hours as needed for wheezing or shortness of breath. 1 each Barrett Henle, MD   predniSONE (DELTASONE) 20 MG tablet Take 2 tablets (40 mg total) by mouth daily with breakfast for 5 days. 10 tablet Barrett Henle, MD   ondansetron (ZOFRAN-ODT) 4 MG disintegrating tablet Take 1 tablet (4 mg total) by mouth every 8 (eight) hours as needed for nausea or vomiting. 10 tablet Alejandria Wessells, Gwenlyn Perking, MD   potassium chloride (KLOR-CON)  10 MEQ tablet Take 2 tablets (20 mEq total) by mouth 2 (two) times daily for 2 doses. 4 tablet Tamicka Shimon, Gwenlyn Perking, MD      PDMP not reviewed this encounter.   Barrett Henle, MD 07/04/22 [phone]

## 2022-07-04 NOTE — ED Triage Notes (Signed)
Pt was at social service yesterday due to feeling so bad that was taken by EMS to [facility] ED. Reports wait was so long left before being seen but has some test done.  Pt coughing up green phlegm and dizzy and fatigue.   Reports needs another inhaler for her asthma.

## 2022-09-03 ENCOUNTER — Telehealth: Payer: 59 | Admitting: Physician Assistant

## 2022-09-03 DIAGNOSIS — B9689 Other specified bacterial agents as the cause of diseases classified elsewhere: Secondary | ICD-10-CM

## 2022-09-03 DIAGNOSIS — J019 Acute sinusitis, unspecified: Secondary | ICD-10-CM | POA: Diagnosis not present

## 2022-09-03 DIAGNOSIS — J4521 Mild intermittent asthma with (acute) exacerbation: Secondary | ICD-10-CM

## 2022-09-03 MED ORDER — PREDNISONE 20 MG PO TABS: 40.0000 mg | ORAL_TABLET | Freq: Every day | ORAL | 0 refills | Status: DC

## 2022-09-03 MED ORDER — AMOXICILLIN-POT CLAVULANATE 875-125 MG PO TABS: 1.0000 | ORAL_TABLET | Freq: Two times a day (BID) | ORAL | 0 refills | Status: DC

## 2022-09-03 NOTE — Patient Instructions (Signed)
Mel Almond, thank you for joining Mar Daring, PA-C for today's virtual visit.  While this provider is not your primary care provider (PCP), if your PCP is located in our provider database this encounter information will be shared with them immediately following your visit.   [facility] account gives you access to today's visit and all your visits, tests, and labs performed at [facility] " click here if you don't have a [facility] account or go to [email or web address]  Consent: (Patient) Anna King provided verbal consent for this virtual visit at the beginning of the encounter.  Current Medications:  Current Outpatient Medications:    amoxicillin-clavulanate (AUGMENTIN) 875-125 MG tablet, Take 1 tablet by mouth 2 (two) times daily., Disp: 14 tablet, Rfl: 0   predniSONE (DELTASONE) 20 MG tablet, Take 2 tablets (40 mg total) by mouth daily with breakfast., Disp: 14 tablet, Rfl: 0   albuterol (VENTOLIN HFA) 108 (90 Base) MCG/ACT inhaler, Inhale 2 puffs into the lungs every 4 (four) hours as needed for wheezing or shortness of breath., Disp: 1 each, Rfl: 0   amLODipine (NORVASC) 10 MG tablet, Take 10 mg by mouth daily., Disp: , Rfl:    atenolol (TENORMIN) 100 MG tablet, Take 100 mg by mouth daily., Disp: , Rfl:    hydrochlorothiazide (HYDRODIURIL) 25 MG tablet, Take 25 mg by mouth daily., Disp: , Rfl:    ondansetron (ZOFRAN-ODT) 4 MG disintegrating tablet, Take 1 tablet (4 mg total) by mouth every 8 (eight) hours as needed for nausea or vomiting., Disp: 10 tablet, Rfl: 0   potassium chloride (KLOR-CON) 10 MEQ tablet, Take 2 tablets (20 mEq total) by mouth 2 (two) times daily for 2 doses., Disp: 4 tablet, Rfl: 0   Medications ordered in this encounter:  Meds ordered this encounter  Medications   amoxicillin-clavulanate (AUGMENTIN) 875-125 MG tablet    Sig: Take 1 tablet by mouth 2 (two) times daily.    Dispense:  14 tablet    Refill:  0     Order Specific Question:   Supervising Provider    Answer:   Chase Picket [id number]   predniSONE (DELTASONE) 20 MG tablet    Sig: Take 2 tablets (40 mg total) by mouth daily with breakfast.    Dispense:  14 tablet    Refill:  0    Order Specific Question:   Supervising Provider    Answer:   Chase Picket [id number]     *If you need refills on other medications prior to your next appointment, please contact your pharmacy*  Follow-Up: Call back or seek an in-person evaluation if the symptoms worsen or if the condition fails to improve as anticipated.  [place] [phone]  Other Instructions  Sinus Infection, Adult A sinus infection, also called sinusitis, is inflammation of your sinuses. Sinuses are hollow spaces in the bones around your face. Your sinuses are located: Around your eyes. In the middle of your forehead. Behind your nose. In your cheekbones. Mucus normally drains out of your sinuses. When your nasal tissues become inflamed or swollen, mucus can become trapped or blocked. This allows bacteria, viruses, and fungi to grow, which leads to infection. Most infections of the sinuses are caused by a virus. A sinus infection can develop quickly. It can last for up to 4 weeks (acute) or for more than 12 weeks (chronic). A sinus infection often develops after a cold. What are the causes? This condition is  caused by anything that creates swelling in the sinuses or stops mucus from draining. This includes: Allergies. Asthma. Infection from bacteria or viruses. Deformities or blockages in your nose or sinuses. Abnormal growths in the nose (nasal polyps). Pollutants, such as chemicals or irritants in the air. Infection from fungi. This is rare. What increases the risk? You are more likely to develop this condition if you: Have a weak body defense system (immune system). Do a lot of swimming or diving. Overuse nasal sprays. Smoke. What are the signs  or symptoms? The main symptoms of this condition are pain and a feeling of pressure around the affected sinuses. Other symptoms include: Stuffy nose or congestion that makes it difficult to breathe through your nose. Thick yellow or greenish drainage from your nose. Tenderness, swelling, and warmth over the affected sinuses. A cough that may get worse at night. Decreased sense of smell and taste. Extra mucus that collects in the throat or the back of the nose (postnasal drip) causing a sore throat or bad breath. Tiredness (fatigue). Fever. How is this diagnosed? This condition is diagnosed based on: Your symptoms. Your medical history. A physical exam. Tests to find out if your condition is acute or chronic. This may include: Checking your nose for nasal polyps. Viewing your sinuses using a device that has a light (endoscope). Testing for allergies or bacteria. Imaging tests, such as an MRI or CT scan. In rare cases, a bone biopsy may be done to rule out more serious types of fungal sinus disease. How is this treated? Treatment for a sinus infection depends on the cause and whether your condition is chronic or acute. If caused by a virus, your symptoms should go away on their own within 10 days. You may be given medicines to relieve symptoms. They include: Medicines that shrink swollen nasal passages (decongestants). A spray that eases inflammation of the nostrils (topical intranasal corticosteroids). Rinses that help get rid of thick mucus in your nose (nasal saline washes). Medicines that treat allergies (antihistamines). Over-the-counter pain relievers. If caused by bacteria, your health care provider may recommend waiting to see if your symptoms improve. Most bacterial infections will get better without antibiotic medicine. You may be given antibiotics if you have: A severe infection. A weak immune system. If caused by narrow nasal passages or nasal polyps, surgery may be  needed. Follow these instructions at home: Medicines Take, use, or apply over-the-counter and prescription medicines only as told by your health care provider. These may include nasal sprays. If you were prescribed an antibiotic medicine, take it as told by your health care provider. Do not stop taking the antibiotic even if you start to feel better. Hydrate and humidify  Drink enough fluid to keep your urine pale yellow. Staying hydrated will help to thin your mucus. Use a cool mist humidifier to keep the humidity level in your home above 50%. Inhale steam for 10-15 minutes, 3-4 times a day, or as told by your health care provider. You can do this in the bathroom while a hot shower is running. Limit your exposure to cool or dry air. Rest Rest as much as possible. Sleep with your head raised (elevated). Make sure you get enough sleep each night. General instructions  Apply a warm, moist washcloth to your face 3-4 times a day or as told by your health care provider. This will help with discomfort. Use nasal saline washes as often as told by your health care provider. Wash your hands often  with soap and water to reduce your exposure to germs. If soap and water are not available, use hand sanitizer. Do not smoke. Avoid being around people who are smoking (secondhand smoke). Keep all follow-up visits. This is important. Contact a health care provider if: You have a fever. Your symptoms get worse. Your symptoms do not improve within 10 days. Get help right away if: You have a severe headache. You have persistent vomiting. You have severe pain or swelling around your face or eyes. You have vision problems. You develop confusion. Your neck is stiff. You have trouble breathing. These symptoms may be an emergency. Get help right away. Call 911. Do not wait to see if the symptoms will go away. Do not drive yourself to the hospital. Summary A sinus infection is soreness and inflammation of  your sinuses. Sinuses are hollow spaces in the bones around your face. This condition is caused by nasal tissues that become inflamed or swollen. The swelling traps or blocks the flow of mucus. This allows bacteria, viruses, and fungi to grow, which leads to infection. If you were prescribed an antibiotic medicine, take it as told by your health care provider. Do not stop taking the antibiotic even if you start to feel better. Keep all follow-up visits. This is important. This information is not intended to replace advice given to you by your health care provider. Make sure you discuss any questions you have with your health care provider. Document Revised: 05/16/2021 Document Reviewed: 05/16/2021 Elsevier Patient Education  [place].    If you have been instructed to have an in-person evaluation today at a local Urgent Care facility, please use the link below. It will take you to a list of all of our available [facility], including address, phone number and hours of operation. Please do not delay care.  [facility]  If you or a family member do not have a primary care provider, use the link below to schedule a visit and establish care. When you choose a [facility] primary care physician or advanced practice provider, you gain a long-term partner in health. Find a Primary Care Provider  Learn more about 's in-office and virtual care options: [facility]

## 2022-09-03 NOTE — Progress Notes (Signed)
Virtual Visit Consent   Anna HEINSOHN, you are scheduled for a virtual visit with a [place] provider today. Just as with appointments in the office, your consent must be obtained to participate. Your consent will be active for this visit and any virtual visit you may have with one of our providers in the next 365 days. If you have a MyChart account, a copy of this consent can be sent to you electronically.  As this is a virtual visit, video technology does not allow for your provider to perform a traditional examination. This may limit your provider's ability to fully assess your condition. If your provider identifies any concerns that need to be evaluated in person or the need to arrange testing (such as labs, EKG, etc.), we will make arrangements to do so. Although advances in technology are sophisticated, we cannot ensure that it will always work on either your end or our end. If the connection with a video visit is poor, the visit may have to be switched to a telephone visit. With either a video or telephone visit, we are not always able to ensure that we have a secure connection.  By engaging in this virtual visit, you consent to the provision of healthcare and authorize for your insurance to be billed (if applicable) for the services provided during this visit. Depending on your insurance coverage, you may receive a charge related to this service.  I need to obtain your verbal consent now. Are you willing to proceed with your visit today? CLASSIE AUJLA has provided verbal consent on 09/03/2022 for a virtual visit (video or telephone). Mar Daring, PA-C  Date: 09/03/2022 6:36 PM  Virtual Visit via Video Note   I, Mar Daring, connected with  Anna King  ([id number], May 26, 1981) on 09/03/22 at  6:30 PM EDT by a video-enabled telemedicine application and verified that I am speaking with the correct person using two identifiers.  Location: Patient: Virtual Visit Location  Patient: Mobile Provider: Virtual Visit Location Provider: Home Office   I discussed the limitations of evaluation and management by telemedicine and the availability of in person appointments. The patient expressed understanding and agreed to proceed.    History of Present Illness: Anna King is a 42 y.o. who identifies as a female who was assigned female at birth, and is being seen today for URI symptoms.  HPI: URI  This is a new problem. The current episode started in the past 7 days. The problem has been gradually worsening. There has been no fever. Associated symptoms include chest pain (tightness), congestion, coughing, headaches, a plugged ear sensation, rhinorrhea, sinus pain, a sore throat, vomiting (with coughing) and wheezing. Pertinent negatives include no diarrhea, ear pain or nausea. Associated symptoms comments: Eyes watering, nasal congestion. Treatments tried: albuterol, mucinex. The treatment provided no relief.   PMH: asthma  Problems:  Patient Active Problem List   Diagnosis Date Noted   Presence of IUD 08/13/2021   Gallstones 04/22/2019   BMI 50.0-59.9, adult ([place]) 10/22/2018   Essential hypertension 02/03/2018    Allergies:  Allergies  Allergen Reactions   Shellfish Allergy Anaphylaxis   Betadine [Povidone Iodine] Itching and Rash   Medications:  Current Outpatient Medications:    amoxicillin-clavulanate (AUGMENTIN) 875-125 MG tablet, Take 1 tablet by mouth 2 (two) times daily., Disp: 14 tablet, Rfl: 0   predniSONE (DELTASONE) 20 MG tablet, Take 2 tablets (40 mg total) by mouth daily with breakfast., Disp: 14 tablet, Rfl: 0  albuterol (VENTOLIN HFA) 108 (90 Base) MCG/ACT inhaler, Inhale 2 puffs into the lungs every 4 (four) hours as needed for wheezing or shortness of breath., Disp: 1 each, Rfl: 0   amLODipine (NORVASC) 10 MG tablet, Take 10 mg by mouth daily., Disp: , Rfl:    atenolol (TENORMIN) 100 MG tablet, Take 100 mg by mouth daily., Disp: , Rfl:     hydrochlorothiazide (HYDRODIURIL) 25 MG tablet, Take 25 mg by mouth daily., Disp: , Rfl:    ondansetron (ZOFRAN-ODT) 4 MG disintegrating tablet, Take 1 tablet (4 mg total) by mouth every 8 (eight) hours as needed for nausea or vomiting., Disp: 10 tablet, Rfl: 0   potassium chloride (KLOR-CON) 10 MEQ tablet, Take 2 tablets (20 mEq total) by mouth 2 (two) times daily for 2 doses., Disp: 4 tablet, Rfl: 0  Observations/Objective: Patient is well-developed, well-nourished in no acute distress.  Resting comfortably  Head is normocephalic, atraumatic.  No labored breathing.  Speech is clear and coherent with logical content.  Patient is alert and oriented at baseline.    Assessment and Plan: 1. Acute bacterial sinusitis - amoxicillin-clavulanate (AUGMENTIN) 875-125 MG tablet; Take 1 tablet by mouth 2 (two) times daily.  Dispense: 14 tablet; Refill: 0  2. Mild intermittent asthma with exacerbation - amoxicillin-clavulanate (AUGMENTIN) 875-125 MG tablet; Take 1 tablet by mouth 2 (two) times daily.  Dispense: 14 tablet; Refill: 0 - predniSONE (DELTASONE) 20 MG tablet; Take 2 tablets (40 mg total) by mouth daily with breakfast.  Dispense: 14 tablet; Refill: 0  - Worsening symptoms that have not responded to OTC medications.  - Will give Augmentin - Prednisone for asthma exacerbation - Continue allergy medications.  - Steam and humidifier can help - Stay well hydrated and get plenty of rest.  - Seek in person evaluation if no symptom improvement or if symptoms worsen   Follow Up Instructions: I discussed the assessment and treatment plan with the patient. The patient was provided an opportunity to ask questions and all were answered. The patient agreed with the plan and demonstrated an understanding of the instructions.  A copy of instructions were sent to the patient via MyChart unless otherwise noted below.    The patient was advised to call back or seek an in-person evaluation if the  symptoms worsen or if the condition fails to improve as anticipated.  Time:  I spent 10 minutes with the patient via telehealth technology discussing the above problems/concerns.    Mar Daring, PA-C

## 2022-09-25 ENCOUNTER — Telehealth: Payer: 59 | Admitting: Physician Assistant

## 2022-09-25 DIAGNOSIS — R197 Diarrhea, unspecified: Secondary | ICD-10-CM

## 2022-09-25 MED ORDER — ONDANSETRON 4 MG PO TBDP: 4.0000 mg | ORAL_TABLET | Freq: Three times a day (TID) | ORAL | 0 refills | Status: DC | PRN

## 2022-09-25 MED ORDER — LOPERAMIDE HCL 2 MG PO TABS: 2.0000 mg | ORAL_TABLET | Freq: Four times a day (QID) | ORAL | 0 refills | Status: DC | PRN

## 2022-09-25 NOTE — Patient Instructions (Signed)
Mel Almond, thank you for joining Mar Daring, PA-C for today's virtual visit.  While this provider is not your primary care provider (PCP), if your PCP is located in our provider database this encounter information will be shared with them immediately following your visit.   [facility] account gives you access to today's visit and all your visits, tests, and labs performed at [facility] " click here if you don't have a [facility] account or go to [email or web address]  Consent: (Patient) Anna King provided verbal consent for this virtual visit at the beginning of the encounter.  Current Medications:  Current Outpatient Medications:    loperamide (IMODIUM A-D) 2 MG tablet, Take 1 tablet (2 mg total) by mouth 4 (four) times daily as needed for diarrhea or loose stools., Disp: 30 tablet, Rfl: 0   ondansetron (ZOFRAN-ODT) 4 MG disintegrating tablet, Take 1 tablet (4 mg total) by mouth every 8 (eight) hours as needed., Disp: 20 tablet, Rfl: 0   albuterol (VENTOLIN HFA) 108 (90 Base) MCG/ACT inhaler, Inhale 2 puffs into the lungs every 4 (four) hours as needed for wheezing or shortness of breath., Disp: 1 each, Rfl: 0   amLODipine (NORVASC) 10 MG tablet, Take 10 mg by mouth daily., Disp: , Rfl:    amoxicillin-clavulanate (AUGMENTIN) 875-125 MG tablet, Take 1 tablet by mouth 2 (two) times daily., Disp: 14 tablet, Rfl: 0   atenolol (TENORMIN) 100 MG tablet, Take 100 mg by mouth daily., Disp: , Rfl:    hydrochlorothiazide (HYDRODIURIL) 25 MG tablet, Take 25 mg by mouth daily., Disp: , Rfl:    potassium chloride (KLOR-CON) 10 MEQ tablet, Take 2 tablets (20 mEq total) by mouth 2 (two) times daily for 2 doses., Disp: 4 tablet, Rfl: 0   predniSONE (DELTASONE) 20 MG tablet, Take 2 tablets (40 mg total) by mouth daily with breakfast., Disp: 14 tablet, Rfl: 0   Medications ordered in this encounter:  Meds ordered this encounter  Medications   loperamide  (IMODIUM A-D) 2 MG tablet    Sig: Take 1 tablet (2 mg total) by mouth 4 (four) times daily as needed for diarrhea or loose stools.    Dispense:  30 tablet    Refill:  0    Order Specific Question:   Supervising Provider    Answer:   LAMPTEY, PHILIP O [id number]   ondansetron (ZOFRAN-ODT) 4 MG disintegrating tablet    Sig: Take 1 tablet (4 mg total) by mouth every 8 (eight) hours as needed.    Dispense:  20 tablet    Refill:  0    Order Specific Question:   Supervising Provider    Answer:   Chase Picket [id number]     *If you need refills on other medications prior to your next appointment, please contact your pharmacy*  Follow-Up: Call back or seek an in-person evaluation if the symptoms worsen or if the condition fails to improve as anticipated.  [place] [phone]  Other Instructions  Diarrhea, Adult Diarrhea is frequent loose and sometimes watery bowel movements. Diarrhea can make you feel weak and cause you to become dehydrated. Dehydration is a condition in which there is not enough water or other fluids in the body. Dehydration can make you tired and thirsty, cause you to have a dry mouth, and decrease how often you urinate. Diarrhea typically lasts 2-3 days. However, it can last longer if it is a sign of something more serious. It  is important to treat your diarrhea as told by your health care provider. Follow these instructions at home: Eating and drinking     Follow these recommendations as told by your health care provider: Take an oral rehydration solution (ORS). This is an over-the-counter medicine that helps return your body to its normal balance of nutrients and water. It is found at pharmacies and retail stores. Drink enough fluid to keep your urine pale yellow. Drink fluids such as water, diluted fruit juice, and low-calorie sports drinks. You can drink milk also, if desired. Sucking on ice chips is another way to get fluids. Avoid drinking  fluids that contain a lot of sugar or caffeine, such as soda, energy drinks, and regular sports drinks. Avoid alcohol. Eat bland, easy-to-digest foods in small amounts as you are able. These foods include bananas, applesauce, rice, lean meats, toast, and crackers. Avoid spicy or fatty foods.  Medicines Take over-the-counter and prescription medicines only as told by your health care provider. If you were prescribed antibiotics, take them as told by your health care provider. Do not stop using the antibiotic even if you start to feel better. General instructions  Wash your hands often using soap and water for at least 20 seconds. If soap and water are not available, use hand sanitizer. Others in the household should wash their hands as well. Hands should be washed: After using the toilet or changing a diaper. Before preparing, cooking, or serving food. While caring for a sick person or while visiting someone in a hospital. Rest at home while you recover. Take a warm bath to relieve any burning or pain from frequent diarrhea episodes. Watch your condition for any changes. Contact a health care provider if: You have a fever. Your diarrhea gets worse. You have new symptoms. You vomit every time you eat or drink. You feel light-headed, dizzy, or have a headache. You have muscle cramps. You have signs of dehydration, such as: Dark urine, very little urine, or no urine. Cracked lips. Dry mouth. Sunken eyes. Sleepiness. Weakness. You have bloody or black stools or stools that look like tar. You have severe pain, cramping, or bloating in your abdomen. Your skin feels cold and clammy. You feel confused. Get help right away if: You have chest pain or your heart is beating very quickly. You have trouble breathing or you are breathing very quickly. You feel extremely weak or you faint. These symptoms may be an emergency. Get help right away. Call 911. Do not wait to see if the symptoms  will go away. Do not drive yourself to the hospital. This information is not intended to replace advice given to you by your health care provider. Make sure you discuss any questions you have with your health care provider. Document Revised: 11/28/2021 Document Reviewed: 11/28/2021 Elsevier Patient Education  [place].    If you have been instructed to have an in-person evaluation today at a local Urgent Care facility, please use the link below. It will take you to a list of all of our available [facility], including address, phone number and hours of operation. Please do not delay care.   Urgent Cares  If you or a family member do not have a primary care provider, use the link below to schedule a visit and establish care. When you choose a  primary care physician or advanced practice provider, you gain a long-term partner in health. Find a Primary Care Provider  Learn more  about [facility]'s in-office and virtual care options: [facility]

## 2022-09-25 NOTE — Progress Notes (Signed)
Virtual Visit Consent   Anna King, you are scheduled for a virtual visit with a [facility] provider today. Just as with appointments in the office, your consent must be obtained to participate. Your consent will be active for this visit and any virtual visit you may have with one of our providers in the next 365 days. If you have a MyChart account, a copy of this consent can be sent to you electronically.  As this is a virtual visit, video technology does not allow for your provider to perform a traditional examination. This may limit your provider's ability to fully assess your condition. If your provider identifies any concerns that need to be evaluated in person or the need to arrange testing (such as labs, EKG, etc.), we will make arrangements to do so. Although advances in technology are sophisticated, we cannot ensure that it will always work on either your end or our end. If the connection with a video visit is poor, the visit may have to be switched to a telephone visit. With either a video or telephone visit, we are not always able to ensure that we have a secure connection.  By engaging in this virtual visit, you consent to the provision of healthcare and authorize for your insurance to be billed (if applicable) for the services provided during this visit. Depending on your insurance coverage, you may receive a charge related to this service.  I need to obtain your verbal consent now. Are you willing to proceed with your visit today? Anna King has provided verbal consent on 09/25/2022 for a virtual visit (video or telephone). Mar Daring, PA-C  Date: 09/25/2022 5:13 PM  Virtual Visit via Video Note   I, Mar Daring, connected with  Anna King  ([id number], January 24, 1981) on 09/25/22 at  5:15 PM EDT by a video-enabled telemedicine application and verified that I am speaking with the correct person using two identifiers.  Location: Patient: Virtual Visit Location  Patient: Mobile Provider: Virtual Visit Location Provider: Home Office   I discussed the limitations of evaluation and management by telemedicine and the availability of in person appointments. The patient expressed understanding and agreed to proceed.    History of Present Illness: Anna King is a 42 y.o. who identifies as a female who was assigned female at birth, and is being seen today for diarrhea. Symptoms started 2 days ago. Stools are loose and watery. Has had associated nausea, chills, and abdominal cramping. Denies fevers, vomiting, hematochezia, melena. Had been taking Ibuprofen for abdominal pain.    Problems:  Patient Active Problem List   Diagnosis Date Noted   Presence of IUD 08/13/2021   Gallstones 04/22/2019   BMI 50.0-59.9, adult 10/22/2018   Essential hypertension 02/03/2018    Allergies:  Allergies  Allergen Reactions   Shellfish Allergy Anaphylaxis   Betadine [Povidone Iodine] Itching and Rash   Medications:  Current Outpatient Medications:    loperamide (IMODIUM A-D) 2 MG tablet, Take 1 tablet (2 mg total) by mouth 4 (four) times daily as needed for diarrhea or loose stools., Disp: 30 tablet, Rfl: 0   ondansetron (ZOFRAN-ODT) 4 MG disintegrating tablet, Take 1 tablet (4 mg total) by mouth every 8 (eight) hours as needed., Disp: 20 tablet, Rfl: 0   albuterol (VENTOLIN HFA) 108 (90 Base) MCG/ACT inhaler, Inhale 2 puffs into the lungs every 4 (four) hours as needed for wheezing or shortness of breath., Disp: 1 each, Rfl: 0   amLODipine (NORVASC)  10 MG tablet, Take 10 mg by mouth daily., Disp: , Rfl:    amoxicillin-clavulanate (AUGMENTIN) 875-125 MG tablet, Take 1 tablet by mouth 2 (two) times daily., Disp: 14 tablet, Rfl: 0   atenolol (TENORMIN) 100 MG tablet, Take 100 mg by mouth daily., Disp: , Rfl:    hydrochlorothiazide (HYDRODIURIL) 25 MG tablet, Take 25 mg by mouth daily., Disp: , Rfl:    potassium chloride (KLOR-CON) 10 MEQ tablet, Take 2 tablets (20 mEq  total) by mouth 2 (two) times daily for 2 doses., Disp: 4 tablet, Rfl: 0   predniSONE (DELTASONE) 20 MG tablet, Take 2 tablets (40 mg total) by mouth daily with breakfast., Disp: 14 tablet, Rfl: 0  Observations/Objective: Patient is well-developed, well-nourished in no acute distress.  Resting comfortably  Head is normocephalic, atraumatic.  No labored breathing.  Speech is clear and coherent with logical content.  Patient is alert and oriented at baseline.    Assessment and Plan: 1. Diarrhea of presumed infectious origin - loperamide (IMODIUM A-D) 2 MG tablet; Take 1 tablet (2 mg total) by mouth 4 (four) times daily as needed for diarrhea or loose stools.  Dispense: 30 tablet; Refill: 0 - ondansetron (ZOFRAN-ODT) 4 MG disintegrating tablet; Take 1 tablet (4 mg total) by mouth every 8 (eight) hours as needed.  Dispense: 20 tablet; Refill: 0  - Suspect viral gastroenteritis - Zofran for nausea - Imodium for diarrhea - STOP Ibuprofen - Push fluids, electrolyte beverages - Liquid diet, then increase to soft/bland (BRAT) diet over next day, then increase diet as tolerated - Seek in person evaluation if not improving or symptoms worsen   Follow Up Instructions: I discussed the assessment and treatment plan with the patient. The patient was provided an opportunity to ask questions and all were answered. The patient agreed with the plan and demonstrated an understanding of the instructions.  A copy of instructions were sent to the patient via MyChart unless otherwise noted below.    The patient was advised to call back or seek an in-person evaluation if the symptoms worsen or if the condition fails to improve as anticipated.  Time:  I spent 8 minutes with the patient via telehealth technology discussing the above problems/concerns.    Mar Daring, PA-C

## 2022-10-18 ENCOUNTER — Encounter: Payer: Self-pay | Admitting: Physician Assistant

## 2022-10-18 ENCOUNTER — Ambulatory Visit: Payer: 59 | Admitting: Physician Assistant

## 2022-10-18 VITALS — BP 166/95 | HR 86 | Ht 66.0 in | Wt 339.0 lb

## 2022-10-18 DIAGNOSIS — E6609 Other obesity due to excess calories: Secondary | ICD-10-CM | POA: Diagnosis not present

## 2022-10-18 DIAGNOSIS — Z3202 Encounter for pregnancy test, result negative: Secondary | ICD-10-CM

## 2022-10-18 DIAGNOSIS — Z6841 Body Mass Index (BMI) 40.0 and over, adult: Secondary | ICD-10-CM | POA: Diagnosis not present

## 2022-10-18 DIAGNOSIS — I10 Essential (primary) hypertension: Secondary | ICD-10-CM | POA: Diagnosis not present

## 2022-10-18 DIAGNOSIS — N912 Amenorrhea, unspecified: Secondary | ICD-10-CM | POA: Diagnosis not present

## 2022-10-18 DIAGNOSIS — Z975 Presence of (intrauterine) contraceptive device: Secondary | ICD-10-CM

## 2022-10-18 LAB — POCT URINE PREGNANCY: Preg Test, Ur: NEGATIVE

## 2022-10-18 MED ORDER — BLOOD PRESSURE CUFF MISC: 1.0000 [IU] | Freq: Every day | 0 refills | Status: AC

## 2022-10-18 MED ORDER — HYDROCHLOROTHIAZIDE 12.5 MG PO TABS: 12.5000 mg | ORAL_TABLET | Freq: Every day | ORAL | 1 refills | Status: AC

## 2022-10-18 NOTE — Progress Notes (Signed)
New Patient Office Visit  Subjective    Patient ID: Anna King, female    DOB: 03/16/1981  Age: 42 y.o. MRN: [id number]  CC:  Chief Complaint  Patient presents with   Amenorrhea    Has IUD Mirena     HPI Anna King states that she is concerned that she might be pregnant, states that she has had an IUD that was placed after her 39-year-old was born.  States that she has had intermittent bleeding, generally twice a month since she had a placed and has not had any bleeding this month.  States that she also has been craving seafood and finds it unusual for her.  States that she stopped taking her blood pressure medication because when she would take it she felt that her blood pressure got too low and it caused her to pass out.  States that she has been told by her providers that she needs to restart the medications.     Outpatient Encounter Medications as of 10/18/2022  Medication Sig   Blood Pressure Monitoring (BLOOD PRESSURE CUFF) MISC 1 Units by Does not apply route daily.   albuterol (VENTOLIN HFA) 108 (90 Base) MCG/ACT inhaler Inhale 2 puffs into the lungs every 4 (four) hours as needed for wheezing or shortness of breath.   amLODipine (NORVASC) 10 MG tablet Take 10 mg by mouth daily.   amoxicillin-clavulanate (AUGMENTIN) 875-125 MG tablet Take 1 tablet by mouth 2 (two) times daily.   atenolol (TENORMIN) 100 MG tablet Take 100 mg by mouth daily.   hydrochlorothiazide (HYDRODIURIL) 12.5 MG tablet Take 1 tablet (12.5 mg total) by mouth daily.   loperamide (IMODIUM A-D) 2 MG tablet Take 1 tablet (2 mg total) by mouth 4 (four) times daily as needed for diarrhea or loose stools.   ondansetron (ZOFRAN-ODT) 4 MG disintegrating tablet Take 1 tablet (4 mg total) by mouth every 8 (eight) hours as needed.   potassium chloride (KLOR-CON) 10 MEQ tablet Take 2 tablets (20 mEq total) by mouth 2 (two) times daily for 2 doses.   predniSONE (DELTASONE) 20 MG tablet Take 2 tablets (40 mg  total) by mouth daily with breakfast.   [DISCONTINUED] hydrochlorothiazide (HYDRODIURIL) 25 MG tablet Take 25 mg by mouth daily.   No facility-administered encounter medications on file as of 10/18/2022.    Past Medical History:  Diagnosis Date   Asthma    BV (bacterial vaginosis)    Chlamydia    Hypertension     Past Surgical History:  Procedure Laterality Date   CHOLECYSTECTOMY N/A 04/22/2019   Procedure: LAPAROSCOPIC CHOLECYSTECTOMY;  Surgeon: Griselda Miner, MD;  Location: [facility] OR;  Service: General;  Laterality: N/A;   INTRAOPERATIVE CHOLANGIOGRAM N/A 04/22/2019   Procedure: Intraoperative Cholangiogram;  Surgeon: Griselda Miner, MD;  Location: [facility] OR;  Service: General;  Laterality: N/A;   WISDOM TOOTH EXTRACTION      Family History  Problem Relation Age of Onset   Diabetes Mother    Hypertension Mother     Social History   Socioeconomic History   Marital status: Single    Spouse name: Not on file   Number of children: Not on file   Years of education: Not on file   Highest education level: Not on file  Occupational History   Not on file  Tobacco Use   Smoking status: Never   Smokeless tobacco: Never  Vaping Use   Vaping Use: Never used  Substance and Sexual Activity  Alcohol use: Yes    Alcohol/week: 2.0 standard drinks of alcohol    Types: 2 Glasses of wine per week    Comment: occasional   Drug use: No   Sexual activity: Yes    Birth control/protection: I.U.D.  Other Topics Concern   Not on file  Social History Narrative   Not on file   Social Determinants of Health   Financial Resource Strain: Not on file  Food Insecurity: Food Insecurity Present (07/02/2018)   Hunger Vital Sign    Worried About Running Out of Food in the Last Year: Never true    Ran Out of Food in the Last Year: Sometimes true  Transportation Needs: Unmet Transportation Needs (07/02/2018)   PRAPARE - Administrator, Civil Service (Medical): Yes    Lack of  Transportation (Non-Medical): No  Physical Activity: Not on file  Stress: Not on file  Social Connections: Not on file  Intimate Partner Violence: Not At Risk (11/18/2018)   Humiliation, Afraid, Rape, and Kick questionnaire    Fear of Current or Ex-Partner: No    Emotionally Abused: No    Physically Abused: No    Sexually Abused: No    Review of Systems  Constitutional: Negative.   HENT: Negative.    Eyes: Negative.   Respiratory:  Negative for shortness of breath.   Cardiovascular:  Negative for chest pain.  Gastrointestinal:  Negative for nausea and vomiting.  Genitourinary: Negative.   Musculoskeletal: Negative.   Skin: Negative.   Neurological: Negative.   Endo/Heme/Allergies: Negative.   Psychiatric/Behavioral: Negative.          Objective    BP (!) 166/95 (BP Location: Left Arm, Patient Position: Sitting, Cuff Size: Large)   Pulse 86   Ht  (1.676 m)   Wt (!) 339 lb (153.8 kg)   LMP 09/22/2022   SpO2 98%   BMI 54.72 kg/m   Physical Exam Vitals and nursing note reviewed.  Constitutional:      Appearance: Normal appearance. She is obese.  HENT:     Head: Normocephalic and atraumatic.     Right Ear: External ear normal.     Left Ear: External ear normal.     Nose: Nose normal.     Mouth/Throat:     Mouth: Mucous membranes are moist.     Pharynx: Oropharynx is clear.  Eyes:     Extraocular Movements: Extraocular movements intact.     Conjunctiva/sclera: Conjunctivae normal.     Pupils: Pupils are equal, round, and reactive to light.  Cardiovascular:     Rate and Rhythm: Normal rate and regular rhythm.     Pulses: Normal pulses.     Heart sounds: Normal heart sounds.  Pulmonary:     Effort: Pulmonary effort is normal.     Breath sounds: Normal breath sounds.  Musculoskeletal:        General: Normal range of motion.     Cervical back: Normal range of motion and neck supple.  Skin:    General: Skin is warm and dry.  Neurological:     General: No  focal deficit present.     Mental Status: She is alert and oriented to person, place, and time.  Psychiatric:        Mood and Affect: Mood normal.        Behavior: Behavior normal.        Thought Content: Thought content normal.        Judgment: Judgment normal.  Assessment & Plan:   Problem List Items Addressed This Visit       Cardiovascular and Mediastinum   Essential hypertension - Primary   Relevant Medications   hydrochlorothiazide (HYDRODIURIL) 12.5 MG tablet     Other   Presence of IUD   Relevant Orders   hCG, serum, qualitative   Ambulatory referral to Gynecology   Other Visit Diagnoses     Pregnancy test negative       Relevant Orders   hCG, serum, qualitative   Amenorrhea       Relevant Orders   hCG, serum, qualitative   Ambulatory referral to Gynecology   POCT urine pregnancy (Completed)      1. Essential hypertension Patient agreeable to restart hydrochlorothiazide at low-dose.  Patient encouraged to check blood pressure at home on a daily basis, was given a printed prescription for blood pressure cuff encouraged to keep a written log of blood pressures, and have available for all office visits.  Patient encouraged to follow-up with primary care provider or return to mobile unit for further evaluation.  Red flags given for prompt reevaluation - hydrochlorothiazide (HYDRODIURIL) 12.5 MG tablet; Take 1 tablet (12.5 mg total) by mouth daily.  Dispense: 30 tablet; Refill: 1  2. Pregnancy test negative Pregnancy test negative, patient requested referral to gynecology for further evaluation and hCG serum for reassurance. - hCG, serum, qualitative  3. Amenorrhea  - hCG, serum, qualitative - Ambulatory referral to Gynecology - POCT urine pregnancy  4. Presence of IUD  - hCG, serum, qualitative - Ambulatory referral to Gynecology  I have reviewed the patient's medical history (PMH, PSH, Social History, Family History, Medications, and allergies) ,  and have been updated if relevant. I spent 30 minutes reviewing chart and  face to face time with patient.    Return if symptoms worsen or fail to improve.   Kasandra Knudsen Mayers, PA-C

## 2022-10-18 NOTE — Patient Instructions (Signed)
To help with your blood pressure, you are going to take hydrochlorothiazide 12.5 mg once daily in the morning.  I encourage you to check your blood pressure on a daily basis, keep a written log and have available for all office visits.  I encourage you to follow-up with your primary care provider, or you are welcome to return to the mobile unit for further evaluation.  I started a referral for you to be seen by gynecology.  We will call you with today's lab result.  Roney Jaffe, PA-C Physician Assistant [facility] [email or web address]  How to Take Your Blood Pressure Blood pressure is a measurement of how strongly your blood is pressing against the walls of your arteries. Arteries are blood vessels that carry blood from your heart throughout your body. Your health care provider takes your blood pressure at each office visit. You can also take your own blood pressure at home with a blood pressure monitor. You may need to take your own blood pressure to: Confirm a diagnosis of high blood pressure (hypertension). Monitor your blood pressure over time. Make sure your blood pressure medicine is working. Supplies needed: Blood pressure monitor. A chair to sit in. This should be a chair where you can sit upright with your back supported. Do not sit on a soft couch or an armchair. Table or desk. Small notebook and pencil or pen. How to prepare To get the most accurate reading, avoid the following for 30 minutes before you check your blood pressure: Drinking caffeine. Drinking alcohol. Eating. Smoking. Exercising. Five minutes before you check your blood pressure: Use the bathroom and urinate so that you have an empty bladder. Sit quietly in a chair. Do not talk. How to take your blood pressure To check your blood pressure, follow the instructions in the manual that came with your blood pressure monitor. If you have a digital blood  pressure monitor, the instructions may be as follows: Sit up straight in a chair. Place your feet on the floor. Do not cross your ankles or legs. Rest your left arm at the level of your heart on a table or desk or on the arm of a chair. Pull up your shirt sleeve. Wrap the blood pressure cuff around the upper part of your left arm, 1 inch (2.5 cm) above your elbow. It is best to wrap the cuff around bare skin. Fit the cuff snugly, but not too tightly, around your arm. You should be able to place only one finger between the cuff and your arm. Position the cord so that it rests in the bend of your elbow. Press the power button. Sit quietly while the cuff inflates and deflates. Read the digital reading on the monitor screen and write the numbers down (record them) in a notebook. Wait 2-3 minutes, then repeat the steps, starting at step 1. What does my blood pressure reading mean? A blood pressure reading consists of a higher number over a lower number. Ideally, your blood pressure should be below 120/80. The first ("top") number is called the systolic pressure. It is a measure of the pressure in your arteries as your heart beats. The second ("bottom") number is called the diastolic pressure. It is a measure of the pressure in your arteries as the heart relaxes. Blood pressure is classified into four stages. The following are the stages for adults who do not have a short-term serious illness or a chronic condition. Systolic pressure and diastolic pressure are measured in a  unit called mm Hg (millimeters of mercury).  Normal Systolic pressure: below 120. Diastolic pressure: below 80. Elevated Systolic pressure: 120-129. Diastolic pressure: below 80. Hypertension stage 1 Systolic pressure: 130-139. Diastolic pressure: 80-89. Hypertension stage 2 Systolic pressure: 140 or above. Diastolic pressure: 90 or above. You can have elevated blood pressure or hypertension even if only the systolic or only  the diastolic number in your reading is higher than normal. Follow these instructions at home: Medicines Take over-the-counter and prescription medicines only as told by your health care provider. Tell your health care provider if you are having any side effects from blood pressure medicine. General instructions Check your blood pressure as often as recommended by your health care provider. Check your blood pressure at the same time every day. Take your monitor to the next appointment with your health care provider to make sure that: You are using it correctly. It provides accurate readings. Understand what your goal blood pressure numbers are. Keep all follow-up visits. This is important. General tips Your health care provider can suggest a reliable monitor that will meet your needs. There are several types of home blood pressure monitors. Choose a monitor that has an arm cuff. Do not choose a monitor that measures your blood pressure from your wrist or finger. Choose a cuff that wraps snugly, not too tight or too loose, around your upper arm. You should be able to fit only one finger between your arm and the cuff. You can buy a blood pressure monitor at most drugstores or online. Where to find more information American Heart Association: www.heart.org Contact a health care provider if: Your blood pressure is consistently high. Your blood pressure is suddenly low. Get help right away if: Your systolic blood pressure is higher than 180. Your diastolic blood pressure is higher than 120. These symptoms may be an emergency. Get help right away. Call 911. Do not wait to see if the symptoms will go away. Do not drive yourself to the hospital. Summary Blood pressure is a measurement of how strongly your blood is pressing against the walls of your arteries. A blood pressure reading consists of a higher number over a lower number. Ideally, your blood pressure should be below 120/80. Check  your blood pressure at the same time every day. Avoid caffeine, alcohol, smoking, and exercise for 30 minutes prior to checking your blood pressure. These agents can affect the accuracy of the blood pressure reading. This information is not intended to replace advice given to you by your health care provider. Make sure you discuss any questions you have with your health care provider. Document Revised: 02/23/2021 Document Reviewed: 02/23/2021 Elsevier Patient Education  2023 [employer].

## 2022-10-19 LAB — HCG, SERUM, QUALITATIVE: hCG,Beta Subunit,Qual,Serum: NEGATIVE m[IU]/mL (ref <6)

## 2022-11-09 ENCOUNTER — Other Ambulatory Visit: Payer: Self-pay | Admitting: Physician Assistant

## 2022-11-09 DIAGNOSIS — I10 Essential (primary) hypertension: Secondary | ICD-10-CM

## 2022-12-31 ENCOUNTER — Emergency Department (HOSPITAL_BASED_OUTPATIENT_CLINIC_OR_DEPARTMENT_OTHER): Payer: 59

## 2022-12-31 ENCOUNTER — Emergency Department (HOSPITAL_BASED_OUTPATIENT_CLINIC_OR_DEPARTMENT_OTHER)
Admission: EM | Admit: 2022-12-31 | Discharge: 2022-12-31 | Disposition: A | Discharge status: Home / Self Care | Payer: 59 | Attending: Emergency Medicine | Admitting: Emergency Medicine

## 2022-12-31 ENCOUNTER — Other Ambulatory Visit: Payer: Self-pay

## 2022-12-31 DIAGNOSIS — Z113 Encounter for screening for infections with a predominantly sexual mode of transmission: Secondary | ICD-10-CM | POA: Diagnosis not present

## 2022-12-31 DIAGNOSIS — G44209 Tension-type headache, unspecified, not intractable: Secondary | ICD-10-CM | POA: Diagnosis not present

## 2022-12-31 DIAGNOSIS — R519 Headache, unspecified: Secondary | ICD-10-CM | POA: Diagnosis present

## 2022-12-31 LAB — URINALYSIS, MICROSCOPIC (REFLEX): WBC, UA: NONE SEEN WBC/hpf (ref 0–5)

## 2022-12-31 LAB — WET PREP, GENITAL
Clue Cells Wet Prep HPF POC: NONE SEEN
Sperm: NONE SEEN
Trich, Wet Prep: NONE SEEN
WBC, Wet Prep HPF POC: <10 (ref <10)
Yeast Wet Prep HPF POC: NONE SEEN

## 2022-12-31 LAB — URINALYSIS, ROUTINE W REFLEX MICROSCOPIC
Bilirubin Urine: NEGATIVE
Glucose, UA: NEGATIVE mg/dL
Ketones, ur: NEGATIVE mg/dL
Leukocytes,Ua: NEGATIVE
Nitrite: NEGATIVE
Protein, ur: 30 mg/dL — AB
Specific Gravity, Urine: 1.025 (ref 1.005–1.030)
pH: 6 (ref 5.0–8.0)

## 2022-12-31 LAB — PREGNANCY, URINE: Preg Test, Ur: NEGATIVE

## 2022-12-31 LAB — HIV ANTIBODY (ROUTINE TESTING W REFLEX): HIV Screen 4th Generation wRfx: NONREACTIVE

## 2022-12-31 MED ORDER — PROCHLORPERAZINE MALEATE 10 MG PO TABS
10.0000 mg | ORAL_TABLET | Freq: Once | ORAL | Status: AC
  Administered 2022-12-31: 10 mg via ORAL
  Filled 2022-12-31: qty 1

## 2022-12-31 MED ORDER — KETOROLAC TROMETHAMINE 15 MG/ML IJ SOLN
15.0000 mg | Freq: Once | INTRAMUSCULAR | Status: AC
  Administered 2022-12-31: 15 mg via INTRAMUSCULAR
  Filled 2022-12-31: qty 1

## 2022-12-31 MED ORDER — ACETAMINOPHEN 500 MG PO TABS
1000.0000 mg | ORAL_TABLET | Freq: Once | ORAL | Status: AC
  Administered 2022-12-31: 1000 mg via ORAL
  Filled 2022-12-31: qty 2

## 2022-12-31 NOTE — ED Notes (Signed)
Per lab, not enough sample of urine left to run GC/Chlamydia test, will need another sample

## 2022-12-31 NOTE — ED Provider Notes (Signed)
[facility] Provider Note   CSN: [id number] Arrival date & time: 12/31/22  1418     History  Chief Complaint  Patient presents with   Exposure to STD    [name] is a 42 y.o. female history of BV presented with vaginal odor and headache for the past few days.  Patient states that the vaginal odors fishy like and has clear discharge.  Patient states that she only has 1 partner and he is asymptomatic at the moment but wants to be tested for all the STDs including GC, trichomonas, yeast infection, HIV, syphilis.  Patient also notes that over the past year or so she has had a bandlike headache.  Patient is taken for pills of Excedrin to no relief and states that headache is a throbbing sensation.  Patient states the headache has been constant since the onset and denies it being sudden or maximal in onset.  Patient denies any aura, head trauma, blood thinners, vision changes, new onset weakness, chest pain, shortness of breath, neck pain, fever.  Patient states that she has been very anxious lately but is unsure of any other trigger of her headache.  Patient denies dysuria, hematuria, vaginal bleeding, abdominal pain  Home Medications Prior to Admission medications   Medication Sig Start Date End Date Taking? Authorizing Provider  albuterol (VENTOLIN HFA) 108 (90 Base) MCG/ACT inhaler Inhale 2 puffs into the lungs every 4 (four) hours as needed for wheezing or shortness of breath. 07/04/22   Zenia Resides, MD  amLODipine (NORVASC) 10 MG tablet Take 10 mg by mouth daily. 04/12/20   [provider]  amoxicillin-clavulanate (AUGMENTIN) 875-125 MG tablet Take 1 tablet by mouth 2 (two) times daily. 09/03/22   Margaretann Loveless, PA-C  atenolol (TENORMIN) 100 MG tablet Take 100 mg by mouth daily.    [provider]  Blood Pressure Monitoring (BLOOD PRESSURE CUFF) MISC 1 Units by Does not apply route daily. 10/18/22   Mayers, Cari  S, PA-C  hydrochlorothiazide (HYDRODIURIL) 12.5 MG tablet Take 1 tablet (12.5 mg total) by mouth daily. 10/18/22   Mayers, Cari S, PA-C  loperamide (IMODIUM A-D) 2 MG tablet Take 1 tablet (2 mg total) by mouth 4 (four) times daily as needed for diarrhea or loose stools. 09/25/22   Margaretann Loveless, PA-C  ondansetron (ZOFRAN-ODT) 4 MG disintegrating tablet Take 1 tablet (4 mg total) by mouth every 8 (eight) hours as needed. 09/25/22   Margaretann Loveless, PA-C  potassium chloride (KLOR-CON) 10 MEQ tablet Take 2 tablets (20 mEq total) by mouth 2 (two) times daily for 2 doses. 07/04/22 07/05/22  Zenia Resides, MD  predniSONE (DELTASONE) 20 MG tablet Take 2 tablets (40 mg total) by mouth daily with breakfast. 09/03/22   Margaretann Loveless, PA-C      Allergies    Shellfish allergy and Betadine [povidone iodine]    Review of Systems   Review of Systems See HPI Physical Exam Updated Vital Signs BP (!) 146/97   Pulse 67   Temp 98 F (36.7 C)   Resp 18   SpO2 98%  Physical Exam Vitals reviewed. Exam conducted with a chaperone present.  Constitutional:      General: She is not in acute distress. HENT:     Head: Normocephalic and atraumatic.  Eyes:     Extraocular Movements: Extraocular movements intact.     Conjunctiva/sclera: Conjunctivae normal.     Pupils: Pupils are equal,  round, and reactive to light.  Cardiovascular:     Rate and Rhythm: Normal rate and regular rhythm.     Pulses: Normal pulses.     Heart sounds: Normal heart sounds.     Comments: 2+ bilateral radial/dorsalis pedis pulses with regular rate Pulmonary:     Effort: Pulmonary effort is normal. No respiratory distress.     Breath sounds: Normal breath sounds.  Abdominal:     Palpations: Abdomen is soft.     Tenderness: There is no abdominal tenderness. There is no guarding or rebound.  Genitourinary:    Exam position: Lithotomy position.     Pubic Area: No rash.      Labia:        Right: No rash, tenderness,  lesion or injury.        Left: No rash, tenderness, lesion or injury.      Vagina: Normal.     Cervix: Normal.     Uterus: Normal.      Adnexa: Right adnexa normal and left adnexa normal.     Comments: Chaperone: Ursula Alert, RN No CMT Cervical os closed Musculoskeletal:        General: Normal range of motion.     Cervical back: Normal range of motion and neck supple.     Comments: 5 out of 5 bilateral grip/leg extension strength  Skin:    General: Skin is warm and dry.     Capillary Refill: Capillary refill takes less than 2 seconds.  Neurological:     General: No focal deficit present.     Mental Status: She is alert and oriented to person, place, and time.     Sensory: Sensation is intact.     Motor: Motor function is intact.     Coordination: Coordination is intact.     Gait: Gait is intact.     Comments: Sensation intact in all 4 limbs Vision grossly intact Cranials 3 through 12 intact  Psychiatric:        Mood and Affect: Mood normal.     ED Results / Procedures / Treatments   Labs (all labs ordered are listed, but only abnormal results are displayed) Labs Reviewed  URINALYSIS, ROUTINE W REFLEX MICROSCOPIC - Abnormal; Notable for the following components:      Result Value   APPearance HAZY (*)    Hgb urine dipstick SMALL (*)    Protein, ur 30 (*)    All other components within normal limits  URINALYSIS, MICROSCOPIC (REFLEX) - Abnormal; Notable for the following components:   Bacteria, UA FEW (*)    All other components within normal limits  WET PREP, GENITAL  PREGNANCY, URINE  RPR  HIV ANTIBODY (ROUTINE TESTING W REFLEX)  GC/CHLAMYDIA PROBE AMP ([place]) NOT AT [facility]    EKG None  Radiology CT Head Wo Contrast  Result Date: 12/31/2022 CLINICAL DATA:  Headache with hypertension EXAM: CT HEAD WITHOUT CONTRAST TECHNIQUE: Contiguous axial images were obtained from the base of the skull through the vertex without intravenous contrast. RADIATION DOSE  REDUCTION: This exam was performed according to the departmental dose-optimization program which includes automated exposure control, adjustment of the mA and/or kV according to patient size and/or use of iterative reconstruction technique. COMPARISON:  None Available. FINDINGS: Brain: No acute territorial infarction, hemorrhage or intracranial mass. The ventricles are nonenlarged. Possible subtle foci of white matter disease. Vascular: No hyperdense vessels.  No unexpected calcification Skull: Normal. Negative for fracture or focal lesion. Sinuses/Orbits: No acute finding.  Other: None IMPRESSION: No CT evidence for acute intracranial abnormality. Possible subtle foci of white matter disease. MRI follow-up is suggested. Electronically Signed   By: Jasmine Pang M.D.   On: 12/31/2022 19:37    Procedures Procedures    Medications Ordered in ED Medications  acetaminophen (TYLENOL) tablet 1,000 mg (1,000 mg Oral Given 12/31/22 1724)  prochlorperazine (COMPAZINE) tablet 10 mg (10 mg Oral Given 12/31/22 1724)  ketorolac (TORADOL) 15 MG/ML injection 15 mg (15 mg Intramuscular Given 12/31/22 2003)    ED Course/ Medical Decision Making/ A&P                             Medical Decision Making Amount and/or Complexity of Data Reviewed Labs: ordered.   [name] 42 y.o. presented today for STD and headache. Working DDx that I considered at this time includes, but not limited to, STD, BV, PID, migraine, tension headache, ICH, subdural/epidural hematoma.  R/o DDx: BV, PID, tension headache, ICH, subdural/epidural hematoma: These are considered less likely due to history of present illness and physical exam findings  Review of prior external notes: 07/04/2038  Unique Tests and My Interpretation:  GC: Pending RPR: Pending HIV: Pending Wet mount: Negative CT head without contrast: Subtle foci of possible white matter disease, recommend outpatient MRI  Discussion with Independent Historian:  None  Discussion of Management of Tests: None  Risk: Medium: prescription drug management  Risk Stratification Score: None  Plan: On exam patient was was stable.  Patient's BP was elevated however this appears to be her baseline.  Patient was mildly tachycardic at 104 however in the room patient was nontachycardic.  Patient's neuroexam was reassuring and with her stating that her headache is a throbbing sensation as a bandlike distribution around her head and I highly suspect patient has tension headache.  Patient given Tylenol and Compazine and.  Patient is she was tested for all the STDs and so a pelvic exam will be conducted with the nurse.  Pending patient's wet mount patient will be discharged with her without antibiotics with follow-up with the women's clinic.  Patient stable this time.  Patient's wet mount came back negative.  Patient blood pressure continues to stay elevated at 191/104.  Due to patient endorsing headache a CT scan was ordered to fully evaluate.  When I went to reevaluate the patient and recheck her blood pressure her blood pressure was 148/90.  Patient states even after receiving the Tylenol and Compazine she still has a throbbing headache and so CT will further evaluate patient.  Patient stable at this time.  Patient CT did come back reassuring however does show subtle foci of possible white matter disease that the radiologist recommends outpatient MRI for.  These incidental findings were verbalized to the patient and patient verbalized understanding of the plan.  Patient given 1 shot of Toradol prior to discharge.  Patient states that after seeing the Tylenol and Compazine her headache is getting slightly better but the bandlike tension is still there.  At this time suspect patient has tension headache and encouraged patient to avoid using Excedrin as the caffeine may be playing a role in her headache.  Encourage patient use Tylenol every 6 hours as needed for pain to  follow-up with her primary care provider for headache and outpatient MRI.  I encouraged patient to follow-up on the MyChart app for her STD results and to follow-up with women's health clinic to become established  with GYN.  Patient was given return precautions. Patient stable for discharge at this time.  Patient verbalized understanding of plan.         Final Clinical Impression(s) / ED Diagnoses Final diagnoses:  Tension headache  Screen for STD (sexually transmitted disease)    Rx / DC Orders ED Discharge Orders     None         Remi Deter 12/31/22 Juanita Laster, MD 01/01/23 1517

## 2022-12-31 NOTE — ED Triage Notes (Signed)
Patient presents to ED via POV from home. Here with concern for STD. Reports vaginal odor.

## 2022-12-31 NOTE — Discharge Instructions (Addendum)
Please follow-up with the wound health clinic in regards to your STD screening.  Your STD labs will come and the next few days so please use the MyChart app to be updated on these results.  Today the CT of your head was reassuring however there were some small abnormalities noted by the radiologist and he recommends an outpatient MRI done through your primary care provider to be evaluated.  He may take Tylenol every 6 hours as needed for pain as symptoms change or worsen please return to ER.

## 2023-01-01 LAB — GC/CHLAMYDIA PROBE AMP (~~LOC~~) NOT AT ARMC
Chlamydia: NEGATIVE
Comment: NEGATIVE
Comment: NORMAL
Neisseria Gonorrhea: NEGATIVE

## 2023-01-01 LAB — RPR: RPR Ser Ql: NONREACTIVE

## 2023-01-29 ENCOUNTER — Telehealth: Payer: 59 | Admitting: Physician Assistant

## 2023-01-29 DIAGNOSIS — G43909 Migraine, unspecified, not intractable, without status migrainosus: Secondary | ICD-10-CM | POA: Diagnosis not present

## 2023-01-29 MED ORDER — SUMATRIPTAN SUCCINATE 25 MG PO TABS: 25.0000 mg | ORAL_TABLET | Freq: Once | ORAL | 0 refills | Status: AC

## 2023-01-29 MED ORDER — TIZANIDINE HCL 4 MG PO TABS: 4.0000 mg | ORAL_TABLET | Freq: Four times a day (QID) | ORAL | 0 refills | Status: DC | PRN

## 2023-01-29 NOTE — Progress Notes (Signed)
Virtual Visit Consent   Anna King, you are scheduled for a virtual visit with a [facility] provider today. Just as with appointments in the office, your consent must be obtained to participate. Your consent will be active for this visit and any virtual visit you may have with one of our providers in the next 365 days. If you have a MyChart account, a copy of this consent can be sent to you electronically.  As this is a virtual visit, video technology does not allow for your provider to perform a traditional examination. This may limit your provider's ability to fully assess your condition. If your provider identifies any concerns that need to be evaluated in person or the need to arrange testing (such as labs, EKG, etc.), we will make arrangements to do so. Although advances in technology are sophisticated, we cannot ensure that it will always work on either your end or our end. If the connection with a video visit is poor, the visit may have to be switched to a telephone visit. With either a video or telephone visit, we are not always able to ensure that we have a secure connection.  By engaging in this virtual visit, you consent to the provision of healthcare and authorize for your insurance to be billed (if applicable) for the services provided during this visit. Depending on your insurance coverage, you may receive a charge related to this service.  I need to obtain your verbal consent now. Are you willing to proceed with your visit today? Anna King has provided verbal consent on 01/29/2023 for a virtual visit (video or telephone). Piedad Climes, [place]  Date: 01/29/2023 8:46 AM  Virtual Visit via Video Note   I, Piedad Climes, connected with  Anna King  ([id number], Nov 07, 1980) on 01/29/23 at  8:30 AM EDT by a video-enabled telemedicine application and verified that I am speaking with the correct person using two identifiers.  Location: Patient: Virtual Visit Location  Patient: Home Provider: Virtual Visit Location Provider: Home Office   I discussed the limitations of evaluation and management by telemedicine and the availability of in person appointments. The patient expressed understanding and agreed to proceed.    History of Present Illness: Anna King is a 42 y.o. who identifies as a female who was assigned female at birth, and is being seen today for headache. Notes she woke up with a cramp in R side of neck to base of skull. Notes this worked out but developed a migraine that is now bilateral. Notes sensitivity to lights and sound. Denies vomiting. Some nausea. Has been trying to rest to sleep it off.   OTC -- Nothing as of yet.   HPI: HPI  Problems:  Patient Active Problem List   Diagnosis Date Noted   Presence of IUD 08/13/2021   Gallstones 04/22/2019   BMI 50.0-59.9, adult (HCC) 10/22/2018   Essential hypertension 02/03/2018    Allergies:  Allergies  Allergen Reactions   Shellfish Allergy Anaphylaxis   Betadine [Povidone Iodine] Itching and Rash   Medications:  Current Outpatient Medications:    SUMAtriptan (IMITREX) 25 MG tablet, Take 1 tablet (25 mg total) by mouth once for 1 dose. May repeat in 2 hours if headache persists or recurs., Disp: 10 tablet, Rfl: 0   tiZANidine (ZANAFLEX) 4 MG tablet, Take 1 tablet (4 mg total) by mouth every 6 (six) hours as needed for muscle spasms., Disp: 30 tablet, Rfl: 0   albuterol (VENTOLIN HFA)  108 (90 Base) MCG/ACT inhaler, Inhale 2 puffs into the lungs every 4 (four) hours as needed for wheezing or shortness of breath., Disp: 1 each, Rfl: 0   amLODipine (NORVASC) 10 MG tablet, Take 10 mg by mouth daily., Disp: , Rfl:    atenolol (TENORMIN) 100 MG tablet, Take 100 mg by mouth daily., Disp: , Rfl:    Blood Pressure Monitoring (BLOOD PRESSURE CUFF) MISC, 1 Units by Does not apply route daily., Disp: 1 each, Rfl: 0   hydrochlorothiazide (HYDRODIURIL) 12.5 MG tablet, Take 1 tablet (12.5 mg total) by  mouth daily., Disp: 30 tablet, Rfl: 1   potassium chloride (KLOR-CON) 10 MEQ tablet, Take 2 tablets (20 mEq total) by mouth 2 (two) times daily for 2 doses., Disp: 4 tablet, Rfl: 0  Observations/Objective: Patient is well-developed, well-nourished in no acute distress.  Resting comfortably  at home.  Head is normocephalic, atraumatic.  No labored breathing.  Speech is clear and coherent with logical content.  Patient is alert and oriented at baseline.   Assessment and Plan: 1. Acute migraine - tiZANidine (ZANAFLEX) 4 MG tablet; Take 1 tablet (4 mg total) by mouth every 6 (six) hours as needed for muscle spasms.  Dispense: 30 tablet; Refill: 0 - SUMAtriptan (IMITREX) 25 MG tablet; Take 1 tablet (25 mg total) by mouth once for 1 dose. May repeat in 2 hours if headache persists or recurs.  Dispense: 10 tablet; Refill: 0  With component of tension headache as well. Supportive measures and OTC medications reviewed. Imitrex for abortive therapy. Tizanidine for neck tension. Strict in-person evaluation precautions reviewed.   Follow Up Instructions: I discussed the assessment and treatment plan with the patient. The patient was provided an opportunity to ask questions and all were answered. The patient agreed with the plan and demonstrated an understanding of the instructions.  A copy of instructions were sent to the patient via MyChart unless otherwise noted below.   The patient was advised to call back or seek an in-person evaluation if the symptoms worsen or if the condition fails to improve as anticipated.  Time:  I spent 10 minutes with the patient via telehealth technology discussing the above problems/concerns.    Piedad Climes, PA-C

## 2023-01-29 NOTE — Patient Instructions (Signed)
Anna King, thank you for joining Piedad Climes, PA-C for today's virtual visit.  While this provider is not your primary care provider (PCP), if your PCP is located in our provider database this encounter information will be shared with them immediately following your visit.   A Anna King MyChart account gives you access to today's visit and all your visits, tests, and labs performed at [facility] " click here if you don't have a [facility] MyChart account or go to [email or web address]  Consent: (Patient) Anna King provided verbal consent for this virtual visit at the beginning of the encounter.  Current Medications:  Current Outpatient Medications:    albuterol (VENTOLIN HFA) 108 (90 Base) MCG/ACT inhaler, Inhale 2 puffs into the lungs every 4 (four) hours as needed for wheezing or shortness of breath., Disp: 1 each, Rfl: 0   amLODipine (NORVASC) 10 MG tablet, Take 10 mg by mouth daily., Disp: , Rfl:    amoxicillin-clavulanate (AUGMENTIN) 875-125 MG tablet, Take 1 tablet by mouth 2 (two) times daily., Disp: 14 tablet, Rfl: 0   atenolol (TENORMIN) 100 MG tablet, Take 100 mg by mouth daily., Disp: , Rfl:    Blood Pressure Monitoring (BLOOD PRESSURE CUFF) MISC, 1 Units by Does not apply route daily., Disp: 1 each, Rfl: 0   hydrochlorothiazide (HYDRODIURIL) 12.5 MG tablet, Take 1 tablet (12.5 mg total) by mouth daily., Disp: 30 tablet, Rfl: 1   loperamide (IMODIUM A-D) 2 MG tablet, Take 1 tablet (2 mg total) by mouth 4 (four) times daily as needed for diarrhea or loose stools., Disp: 30 tablet, Rfl: 0   ondansetron (ZOFRAN-ODT) 4 MG disintegrating tablet, Take 1 tablet (4 mg total) by mouth every 8 (eight) hours as needed., Disp: 20 tablet, Rfl: 0   potassium chloride (KLOR-CON) 10 MEQ tablet, Take 2 tablets (20 mEq total) by mouth 2 (two) times daily for 2 doses., Disp: 4 tablet, Rfl: 0   predniSONE (DELTASONE) 20 MG tablet, Take 2 tablets (40 mg total) by  mouth daily with breakfast., Disp: 14 tablet, Rfl: 0   Medications ordered in this encounter:  No orders of the defined types were placed in this encounter.    *If you need refills on other medications prior to your next appointment, please contact your pharmacy*  Follow-Up: Call back or seek an in-person evaluation if the symptoms worsen or if the condition fails to improve as anticipated.  [facility] [phone]  Other Instructions Take the Imitrex no more than as directed. The tizanidine is for neck tension. Heating pad to neck for 10 minutes, a few times per day. Rest in a dark room. If not resolving or any new or worsening symptoms, seek an in-person evaluation ASAP.    Migraine Headache A migraine headache is a very strong throbbing pain on one or both sides of your head. This type of headache can also cause other symptoms. It can last from 4 hours to 3 days. Talk with your doctor about what things may bring on (trigger) this condition. What are the causes? The exact cause of a migraine is not known. This condition may be brought on or caused by: Smoking. Medicines, such as: Medicine used to treat chest pain (nitroglycerin). Birth control pills. Estrogen. Some blood pressure medicines. Certain substances in some foods or drinks. Foods and drinks, such as: Cheese. Chocolate. Alcohol. Caffeine. Doing physical activity that is very hard. Other things that may trigger a migraine headache include: Periods. Pregnancy. Hunger.  Stress. Getting too much or too little sleep. Weather changes. Feeling tired (fatigue). What increases the risk? Being 65-66 years old. Being female. Having a family history of migraine headaches. Being Caucasian. Having a mental health condition, such as being sad (depressed) or feeling worried or nervous (anxious). Being very overweight (obese). What are the signs or symptoms? A throbbing pain. This pain may: Happen in  any area of the head, such as on one or both sides. Make it hard to do daily activities. Get worse with physical activity. Get worse around bright lights, loud noises, or smells. Other symptoms may include: Feeling like you may vomit (nauseous). Vomiting. Dizziness. Before a migraine headache starts, you may get warning signs (an aura). An aura may include: Seeing flashing lights or having blind spots. Seeing bright spots, halos, or zigzag lines. Having tunnel vision or blurred vision. Having numbness or a tingling feeling. Having trouble talking. Having weak muscles. After a migraine ends, you may have symptoms. These may include: Tiredness. Trouble thinking (concentrating). How is this treated? Taking medicines that: Relieve pain. Relieve the feeling like you may vomit. Prevent migraine headaches. Treatment may also include: Acupuncture. Lifestyle changes like avoiding foods that bring on migraine headaches. Learning ways to control your body functions (biofeedback). Therapy to help you know and deal with negative thoughts (cognitive behavioral therapy). Follow these instructions at home: Medicines Take over-the-counter and prescription medicines only as told by your doctor. If told, take steps to prevent problems with pooping (constipation). You may need to: Drink enough fluid to keep your pee (urine) pale yellow. Take medicines. You will be told what medicines to take. Eat foods that are high in fiber. These include beans, whole grains, and fresh fruits and vegetables. Limit foods that are high in fat and sugar. These include fried or sweet foods. Ask your doctor if you should avoid driving or using machines while you are taking your medicine. Lifestyle  Do not drink alcohol. Do not smoke or use any products that contain nicotine or tobacco. If you need help quitting, ask your doctor. Get 7-9 hours of sleep each night, or the amount recommended by your doctor. Find ways  to deal with stress, such as meditation, deep breathing, or yoga. Try to exercise often. This can help lessen how bad and how often your migraines happen. General instructions Keep a journal to find out what may bring on your migraine headaches. This can help you avoid those things. For example, write down: What you eat and drink. How much sleep you get. Any change to your medicines or diet. If you have a migraine headache: Avoid things that make your symptoms worse, such as bright lights. Lie down in a dark, quiet room. Do not drive or use machinery. Ask your doctor what activities are safe for you. Where to find more information Coalition for Headache and Migraine Patients (CHAMP): headachemigraine.org American Migraine Foundation: americanmigrainefoundation.org National Headache Foundation: headaches.org Contact a doctor if: You get a migraine headache that is different or worse than others you have had. You have more than 15 days of headaches in one month. Get help right away if: Your migraine headache gets very bad. Your migraine headache lasts more than 72 hours. You have a fever or stiff neck. You have trouble seeing. Your muscles feel weak or like you cannot control them. You lose your balance a lot. You have trouble walking. You faint. You have a seizure. This information is not intended to replace advice given to you  by your health care provider. Make sure you discuss any questions you have with your health care provider. Document Revised: 02/05/2022 Document Reviewed: 02/05/2022 Elsevier Patient Education  2024 Elsevier Inc.    If you have been instructed to have an in-person evaluation today at a local Urgent Care facility, please use the link below. It will take you to a list of all of our available [facility], including address, phone number and hours of operation. Please do not delay care.  [facility]  If you or a family member do not  have a primary care provider, use the link below to schedule a visit and establish care. When you choose a [facility] primary care physician or advanced practice provider, you gain a long-term partner in health. Find a Primary Care Provider  Learn more about [facility]'s in-office and virtual care options:  - Get Care Now

## 2023-03-07 ENCOUNTER — Telehealth: Payer: 59 | Admitting: Physician Assistant

## 2023-03-07 DIAGNOSIS — M791 Myalgia, unspecified site: Secondary | ICD-10-CM | POA: Diagnosis not present

## 2023-03-07 DIAGNOSIS — M25474 Effusion, right foot: Secondary | ICD-10-CM

## 2023-03-07 DIAGNOSIS — M25475 Effusion, left foot: Secondary | ICD-10-CM

## 2023-03-07 DIAGNOSIS — M25471 Effusion, right ankle: Secondary | ICD-10-CM | POA: Diagnosis not present

## 2023-03-07 DIAGNOSIS — M25472 Effusion, left ankle: Secondary | ICD-10-CM

## 2023-03-07 MED ORDER — NAPROXEN 500 MG PO TABS: 500.0000 mg | ORAL_TABLET | Freq: Two times a day (BID) | ORAL | 0 refills | Status: AC

## 2023-03-07 MED ORDER — TIZANIDINE HCL 4 MG PO TABS: 2.0000 mg | ORAL_TABLET | Freq: Four times a day (QID) | ORAL | 0 refills | Status: AC | PRN

## 2023-03-07 NOTE — Progress Notes (Signed)
Virtual Visit Consent   Anna King, you are scheduled for a virtual visit with a [facility] provider today. Just as with appointments in the office, your consent must be obtained to participate. Your consent will be active for this visit and any virtual visit you may have with one of our providers in the next 365 days. If you have a MyChart account, a copy of this consent can be sent to you electronically.  As this is a virtual visit, video technology does not allow for your provider to perform a traditional examination. This may limit your provider's ability to fully assess your condition. If your provider identifies any concerns that need to be evaluated in person or the need to arrange testing (such as labs, EKG, etc.), we will make arrangements to do so. Although advances in technology are sophisticated, we cannot ensure that it will always work on either your end or our end. If the connection with a video visit is poor, the visit may have to be switched to a telephone visit. With either a video or telephone visit, we are not always able to ensure that we have a secure connection.  By engaging in this virtual visit, you consent to the provision of healthcare and authorize for your insurance to be billed (if applicable) for the services provided during this visit. Depending on your insurance coverage, you may receive a charge related to this service.  I need to obtain your verbal consent now. Are you willing to proceed with your visit today? Anna King has provided verbal consent on 03/07/2023 for a virtual visit (video or telephone). Anna King  Date: 03/07/2023 9:44 AM  Virtual Visit via Video Note   I, Anna King, connected with  Anna King  ([id number], 09/15/1980) on 03/07/23 at  9:30 AM EDT by a video-enabled telemedicine application and verified that I am speaking with the correct person using two identifiers.  Location: Patient: Virtual Visit Location  Patient: Home Provider: Virtual Visit Location Provider: Home Office   I discussed the limitations of evaluation and management by telemedicine and the availability of in person appointments. The patient expressed understanding and agreed to proceed.    History of Present Illness: Anna King is a 42 y.o. who identifies as a female who was assigned female at birth, and is being seen today for mylagia, spasms, and ankle swelling.  HPI: Muscle Pain This is a new problem. The current episode started yesterday (last night). The problem occurs constantly. The problem is unchanged. The pain occurs in the context of recent physical stress (worked yesterday and reports having had to do a lot of lifting, moving things around, then last night became stiff and sore). Pain location: generalized. The pain is medium. The symptoms are aggravated by any movement. Associated symptoms include fatigue, headaches, joint swelling (ankles) and stiffness. Pertinent negatives include no abdominal pain, constipation, diarrhea, dysuria, nausea, sensory change, visual change or weakness. Past treatments include nothing. The treatment provided no relief. Swelling is present on the feet. She has been Behaving normally.     Problems:  Patient Active Problem List   Diagnosis Date Noted   Presence of IUD 08/13/2021   Gallstones 04/22/2019   BMI 50.0-59.9, adult (HCC) 10/22/2018   Essential hypertension 02/03/2018    Allergies:  Allergies  Allergen Reactions   Shellfish Allergy Anaphylaxis   Betadine [Povidone Iodine] Itching and Rash   Medications:  Current Outpatient Medications:    naproxen (NAPROSYN)  500 MG tablet, Take 1 tablet (500 mg total) by mouth 2 (two) times daily with a meal., Disp: 30 tablet, Rfl: 0   tiZANidine (ZANAFLEX) 4 MG tablet, Take 0.5-1 tablets (2-4 mg total) by mouth every 6 (six) hours as needed for muscle spasms., Disp: 30 tablet, Rfl: 0   albuterol (VENTOLIN HFA) 108 (90 Base) MCG/ACT  inhaler, Inhale 2 puffs into the lungs every 4 (four) hours as needed for wheezing or shortness of breath., Disp: 1 each, Rfl: 0   amLODipine (NORVASC) 10 MG tablet, Take 10 mg by mouth daily., Disp: , Rfl:    atenolol (TENORMIN) 100 MG tablet, Take 100 mg by mouth daily., Disp: , Rfl:    Blood Pressure Monitoring (BLOOD PRESSURE CUFF) MISC, 1 Units by Does not apply route daily., Disp: 1 each, Rfl: 0   hydrochlorothiazide (HYDRODIURIL) 12.5 MG tablet, Take 1 tablet (12.5 mg total) by mouth daily., Disp: 30 tablet, Rfl: 1   potassium chloride (KLOR-CON) 10 MEQ tablet, Take 2 tablets (20 mEq total) by mouth 2 (two) times daily for 2 doses., Disp: 4 tablet, Rfl: 0   SUMAtriptan (IMITREX) 25 MG tablet, Take 1 tablet (25 mg total) by mouth once for 1 dose. May repeat in 2 hours if headache persists or recurs., Disp: 10 tablet, Rfl: 0  Observations/Objective: Patient is well-developed, well-nourished in no acute distress.  Resting comfortably at home.  Head is normocephalic, atraumatic.  No labored breathing.  Speech is clear and coherent with logical content.  Patient is alert and oriented at baseline.    Assessment and Plan: 1. Myalgia - tiZANidine (ZANAFLEX) 4 MG tablet; Take 0.5-1 tablets (2-4 mg total) by mouth every 6 (six) hours as needed for muscle spasms.  Dispense: 30 tablet; Refill: 0 - naproxen (NAPROSYN) 500 MG tablet; Take 1 tablet (500 mg total) by mouth 2 (two) times daily with a meal.  Dispense: 30 tablet; Refill: 0  2. Bilateral swelling of feet and ankles  - Suspect overuse and possible dehydration causing muscle pain and spasms - naproxen and Tizanidine prescribed - Elevate legs  - Epsom salt soaks in bath tub - Tylenol can be used as needed - Work note for today provided - Seek in person evaluation if symptoms worsen or fail to improve  Follow Up Instructions: I discussed the assessment and treatment plan with the patient. The patient was provided an opportunity to  ask questions and all were answered. The patient agreed with the plan and demonstrated an understanding of the instructions.  A copy of instructions were sent to the patient via MyChart unless otherwise noted below.    The patient was advised to call back or seek an in-person evaluation if the symptoms worsen or if the condition fails to improve as anticipated.  Time:  I spent 10 minutes with the patient via telehealth technology discussing the above problems/concerns.    Anna King

## 2023-03-07 NOTE — Patient Instructions (Signed)
[name], thank you for joining Margaretann Loveless, PA-C for today's virtual visit.  While this provider is not your primary care provider (PCP), if your PCP is located in our provider database this encounter information will be shared with them immediately following your visit.   A [facility] MyChart account gives you access to today's visit and all your visits, tests, and labs performed at [facility] " click here if you don't have a [facility] MyChart account or go to [email or web address]  Consent: (Patient) Anna King provided verbal consent for this virtual visit at the beginning of the encounter.  Current Medications:  Current Outpatient Medications:    naproxen (NAPROSYN) 500 MG tablet, Take 1 tablet (500 mg total) by mouth 2 (two) times daily with a meal., Disp: 30 tablet, Rfl: 0   tiZANidine (ZANAFLEX) 4 MG tablet, Take 0.5-1 tablets (2-4 mg total) by mouth every 6 (six) hours as needed for muscle spasms., Disp: 30 tablet, Rfl: 0   albuterol (VENTOLIN HFA) 108 (90 Base) MCG/ACT inhaler, Inhale 2 puffs into the lungs every 4 (four) hours as needed for wheezing or shortness of breath., Disp: 1 each, Rfl: 0   amLODipine (NORVASC) 10 MG tablet, Take 10 mg by mouth daily., Disp: , Rfl:    atenolol (TENORMIN) 100 MG tablet, Take 100 mg by mouth daily., Disp: , Rfl:    Blood Pressure Monitoring (BLOOD PRESSURE CUFF) MISC, 1 Units by Does not apply route daily., Disp: 1 each, Rfl: 0   hydrochlorothiazide (HYDRODIURIL) 12.5 MG tablet, Take 1 tablet (12.5 mg total) by mouth daily., Disp: 30 tablet, Rfl: 1   potassium chloride (KLOR-CON) 10 MEQ tablet, Take 2 tablets (20 mEq total) by mouth 2 (two) times daily for 2 doses., Disp: 4 tablet, Rfl: 0   SUMAtriptan (IMITREX) 25 MG tablet, Take 1 tablet (25 mg total) by mouth once for 1 dose. May repeat in 2 hours if headache persists or recurs., Disp: 10 tablet, Rfl: 0   Medications ordered in this encounter:  Meds  ordered this encounter  Medications   tiZANidine (ZANAFLEX) 4 MG tablet    Sig: Take 0.5-1 tablets (2-4 mg total) by mouth every 6 (six) hours as needed for muscle spasms.    Dispense:  30 tablet    Refill:  0    Order Specific Question:   Supervising Provider    Answer:   Merrilee Jansky [[id number]]   naproxen (NAPROSYN) 500 MG tablet    Sig: Take 1 tablet (500 mg total) by mouth 2 (two) times daily with a meal.    Dispense:  30 tablet    Refill:  0    Order Specific Question:   Supervising Provider    Answer:   Merrilee Jansky [id number]     *If you need refills on other medications prior to your next appointment, please contact your pharmacy*  Follow-Up: Call back or seek an in-person evaluation if the symptoms worsen or if the condition fails to improve as anticipated.  [facility] [phone]  Other Instructions Musculoskeletal Pain Musculoskeletal pain refers to aches and pains in your bones, joints, muscles, and the tissues that surround them. This pain can occur in any part of the body. It can last for a short time (acute) or a long time (chronic). A physical exam, lab tests, and imaging studies may be done to find the cause of your musculoskeletal pain. Follow these instructions at home: Lifestyle Try to control  or lower your stress levels. Stress increases muscle tension and can worsen musculoskeletal pain. It is important to recognize when you are anxious or stressed and learn ways to manage it. This may include: Meditation or yoga. Cognitive or behavioral therapy. Acupuncture or massage therapy. You may continue all activities unless the activities cause more pain. When the pain gets better, slowly resume your normal activities. Gradually increase the intensity and duration of your activities or exercise. Managing pain, stiffness, and swelling     Treatment may include medicines for pain and inflammation that are taken by mouth or applied to the  skin. Take over-the-counter and prescription medicines only as told by your health care provider. When your pain is severe, bed rest may be helpful. Lie or sit in any position that is comfortable, but get out of bed and walk around at least every couple of hours. If directed, apply heat to the affected area as often as told by your health care provider. Use the heat source that your health care provider recommends, such as a moist heat pack or a heating pad. Place a towel between your skin and the heat source. Leave the heat on for 20-30 minutes. Remove the heat if your skin turns bright red. This is especially important if you are unable to feel pain, heat, or cold. You may have a greater risk of getting burned. If directed, put ice on the painful area. To do this: Put ice in a plastic bag. Place a towel between your skin and the bag. Leave the ice on for 20 minutes, 2-3 times a day. Remove the ice if your skin turns bright red. This is very important. If you cannot feel pain, heat, or cold, you have a greater risk of damage to the area. General instructions Your health care provider may recommend that you see a physical therapist. This person can help you come up with a safe exercise program. If told by your health care provider, do physical therapy exercises to improve movement and strength in the affected area. Keep all follow-up visits. This is important. This includes any physical therapy visits. Contact a health care provider if: Your pain gets worse. Medicines do not help ease your pain. You cannot use the part of your body that hurts, such as your arm, leg, or neck. You have trouble sleeping. You have trouble doing your normal activities. Get help right away if: You have a new injury and your pain is worse or different. You feel numb or you have tingling in the painful area. Summary Musculoskeletal pain refers to aches and pains in your bones, joints, muscles, and the tissues that  surround them. This pain can occur in any part of the body. Your health care provider may recommend that you see a physical therapist. This person can help you come up with a safe exercise program. Do any exercises as told by your physical therapist. Lower your stress level. Stress can worsen musculoskeletal pain. Ways to lower stress may include meditation, yoga, cognitive or behavioral therapy, acupuncture, and massage therapy. This information is not intended to replace advice given to you by your health care provider. Make sure you discuss any questions you have with your health care provider. Document Revised: 10/15/2019 Document Reviewed: 09/23/2019 Elsevier Patient Education  2024 Elsevier Inc.    If you have been instructed to have an in-person evaluation today at a local Urgent Care facility, please use the link below. It will take you to a list of  all of our available [facility], including address, phone number and hours of operation. Please do not delay care.  [facility]  If you or a family member do not have a primary care provider, use the link below to schedule a visit and establish care. When you choose a [facility] primary care physician or advanced practice provider, you gain a long-term partner in health. Find a Primary Care Provider  Learn more about [facility]'s in-office and virtual care options: [facility] - Get Care Now

## 2023-03-12 ENCOUNTER — Other Ambulatory Visit: Payer: Self-pay

## 2023-03-12 ENCOUNTER — Emergency Department (HOSPITAL_BASED_OUTPATIENT_CLINIC_OR_DEPARTMENT_OTHER)
Admission: EM | Admit: 2023-03-12 | Discharge: 2023-03-12 | Disposition: A | Discharge status: Home / Self Care | Payer: 59 | Attending: Emergency Medicine | Admitting: Emergency Medicine

## 2023-03-12 ENCOUNTER — Emergency Department (HOSPITAL_BASED_OUTPATIENT_CLINIC_OR_DEPARTMENT_OTHER): Payer: 59

## 2023-03-12 DIAGNOSIS — S8001XA Contusion of right knee, initial encounter: Secondary | ICD-10-CM | POA: Insufficient documentation

## 2023-03-12 DIAGNOSIS — Y990 Civilian activity done for income or pay: Secondary | ICD-10-CM | POA: Insufficient documentation

## 2023-03-12 DIAGNOSIS — W19XXXA Unspecified fall, initial encounter: Secondary | ICD-10-CM | POA: Diagnosis not present

## 2023-03-12 DIAGNOSIS — S8991XA Unspecified injury of right lower leg, initial encounter: Secondary | ICD-10-CM | POA: Diagnosis present

## 2023-03-12 MED ORDER — IBUPROFEN 400 MG PO TABS
600.0000 mg | ORAL_TABLET | Freq: Once | ORAL | Status: AC
  Administered 2023-03-12: 600 mg via ORAL
  Filled 2023-03-12: qty 1

## 2023-03-12 NOTE — Discharge Instructions (Signed)
Continue ibuprofen 600 mg (3 over-the-counter tablets) every 6 hours for pain and soreness. Follow up with your doctor for recheck if pain is no better in 4-5 days.

## 2023-03-12 NOTE — ED Triage Notes (Signed)
C/O fall from work due to wet floor, c/o right knee pain. Stated incident occurred around noon today.

## 2023-03-12 NOTE — ED Provider Notes (Signed)
[facility] Provider Note   CSN: [id number] Arrival date & time: 03/12/23  1612     History  Chief Complaint  Patient presents with   Marletta Lor    Anna King is a 42 y.o. female.  Patient to ED after falling on wet surface at work, landing on right knee. Injury occurred around 1:00 pm today on her way to lunch. No other injury. She reports more soreness over time. No head injury, neck/chest/abdominal pain. No wound.  The history is provided by the patient. No language interpreter was used.  Fall       Home Medications Prior to Admission medications   Medication Sig Start Date End Date Taking? Authorizing Provider  albuterol (VENTOLIN HFA) 108 (90 Base) MCG/ACT inhaler Inhale 2 puffs into the lungs every 4 (four) hours as needed for wheezing or shortness of breath. 07/04/22   Zenia Resides, MD  amLODipine (NORVASC) 10 MG tablet Take 10 mg by mouth daily. 04/12/20   [provider]  atenolol (TENORMIN) 100 MG tablet Take 100 mg by mouth daily.    [provider]  Blood Pressure Monitoring (BLOOD PRESSURE CUFF) MISC 1 Units by Does not apply route daily. 10/18/22   Mayers, Cari S, PA-C  hydrochlorothiazide (HYDRODIURIL) 12.5 MG tablet Take 1 tablet (12.5 mg total) by mouth daily. 10/18/22   Mayers, Cari S, PA-C  naproxen (NAPROSYN) 500 MG tablet Take 1 tablet (500 mg total) by mouth 2 (two) times daily with a meal. 03/07/23   Burnette, Alessandra Bevels, PA-C  potassium chloride (KLOR-CON) 10 MEQ tablet Take 2 tablets (20 mEq total) by mouth 2 (two) times daily for 2 doses. 07/04/22 07/05/22  Zenia Resides, MD  SUMAtriptan (IMITREX) 25 MG tablet Take 1 tablet (25 mg total) by mouth once for 1 dose. May repeat in 2 hours if headache persists or recurs. 01/29/23 01/29/23  Waldon Merl, PA-C  tiZANidine (ZANAFLEX) 4 MG tablet Take 0.5-1 tablets (2-4 mg total) by mouth every 6 (six) hours as needed for muscle spasms. 03/07/23    Margaretann Loveless, PA-C      Allergies    Shellfish allergy and Betadine [povidone iodine]    Review of Systems   Review of Systems  Physical Exam Updated Vital Signs BP (!) 177/109 (BP Location: Right Arm)   Pulse 84   Temp 97.8 F (36.6 C) (Oral)   Resp 18   Ht 5\' 6"  (1.676 m)   Wt (!) 161.5 kg   LMP 03/12/2023   SpO2 99%   BMI 57.46 kg/m  Physical Exam Vitals and nursing note reviewed.  Constitutional:      General: She is not in acute distress.    Appearance: She is obese.  Musculoskeletal:        General: Normal range of motion.     Comments: Right knee is not swollen or discolored. No wound. Joint stable. Tender anteriorly. No thigh or lower leg tenderness.   Skin:    General: Skin is warm and dry.     Findings: No erythema.  Neurological:     Mental Status: She is alert.     Sensory: No sensory deficit.     ED Results / Procedures / Treatments   Labs (all labs ordered are listed, but only abnormal results are displayed) Labs Reviewed - No data to display  EKG None  Radiology DG Knee Complete 4 Views Right  Result Date: 03/12/2023 CLINICAL DATA:  Pain after fall EXAM: RIGHT KNEE - COMPLETE 4 VIEW COMPARISON:  None Available. FINDINGS: No evidence of fracture, dislocation, or joint effusion. No evidence of arthropathy or other focal bone abnormality. Soft tissues are unremarkable. IMPRESSION: No acute osseous abnormality Electronically Signed   By: Karen Kays M.D.   On: 03/12/2023 17:41    Procedures Procedures    Medications Ordered in ED Medications  ibuprofen (ADVIL) tablet 600 mg (600 mg Oral Given 03/12/23 1749)    ED Course/ Medical Decision Making/ A&P Clinical Course as of 03/12/23 1805  Tue Mar 12, 2023  1800 Fall at work to right knee with pain. Xray negative for fracture. Joint stable on exam - doubt internal or ligamentous injury. Recommend PCP follow up with any ongoing pain or soreness.  [SU]    Clinical Course User Index [SU]  Elpidio Anis, PA-C                                 Medical Decision Making Amount and/or Complexity of Data Reviewed Radiology: ordered.           Final Clinical Impression(s) / ED Diagnoses Final diagnoses:  Contusion of right knee, initial encounter    Rx / DC Orders ED Discharge Orders     None         Elpidio Anis, PA-C 03/12/23 1805    Glyn Ade, MD 03/12/23 2259

## 2023-03-20 ENCOUNTER — Other Ambulatory Visit: Payer: Self-pay | Admitting: Physician Assistant

## 2023-03-20 DIAGNOSIS — I10 Essential (primary) hypertension: Secondary | ICD-10-CM
